# Patient Record
Sex: Male | Born: 1960
Health system: Southern US, Community
[De-identification: ages and names within clinical notes are randomized; demographics above are authoritative.]

## PROBLEM LIST (undated history)

## (undated) DIAGNOSIS — I10 Essential (primary) hypertension: Secondary | ICD-10-CM

## (undated) DIAGNOSIS — K219 Gastro-esophageal reflux disease without esophagitis: Secondary | ICD-10-CM

## (undated) DIAGNOSIS — D649 Anemia, unspecified: Secondary | ICD-10-CM

## (undated) DIAGNOSIS — E785 Hyperlipidemia, unspecified: Secondary | ICD-10-CM

## (undated) HISTORY — DX: Essential (primary) hypertension: I10

## (undated) HISTORY — PX: NO PAST SURGERIES: SHX2092

## (undated) HISTORY — DX: Gastro-esophageal reflux disease without esophagitis: K21.9

## (undated) HISTORY — DX: Anemia, unspecified: D64.9

## (undated) HISTORY — DX: Hyperlipidemia, unspecified: E78.5

---

## 1998-01-24 ENCOUNTER — Emergency Department (HOSPITAL_COMMUNITY): Admission: EM | Admit: 1998-01-24 | Discharge: 1998-01-24 | Payer: Self-pay | Admitting: Emergency Medicine

## 1999-05-14 ENCOUNTER — Encounter: Admission: RE | Admit: 1999-05-14 | Discharge: 1999-05-14 | Payer: Self-pay | Admitting: Family Medicine

## 1999-06-04 ENCOUNTER — Encounter: Admission: RE | Admit: 1999-06-04 | Discharge: 1999-06-04 | Payer: Self-pay | Admitting: Family Medicine

## 1999-07-21 ENCOUNTER — Encounter: Admission: RE | Admit: 1999-07-21 | Discharge: 1999-07-21 | Payer: Self-pay | Admitting: Family Medicine

## 2000-01-20 ENCOUNTER — Emergency Department (HOSPITAL_COMMUNITY): Admission: EM | Admit: 2000-01-20 | Discharge: 2000-01-20 | Payer: Self-pay | Admitting: Emergency Medicine

## 2000-01-20 ENCOUNTER — Encounter: Payer: Self-pay | Admitting: Emergency Medicine

## 2004-04-21 ENCOUNTER — Emergency Department (HOSPITAL_COMMUNITY): Admission: EM | Admit: 2004-04-21 | Discharge: 2004-04-21 | Payer: Self-pay | Admitting: Emergency Medicine

## 2004-04-25 ENCOUNTER — Emergency Department (HOSPITAL_COMMUNITY): Admission: EM | Admit: 2004-04-25 | Discharge: 2004-04-25 | Payer: Self-pay | Admitting: Family Medicine

## 2005-09-02 ENCOUNTER — Emergency Department (HOSPITAL_COMMUNITY): Admission: EM | Admit: 2005-09-02 | Discharge: 2005-09-02 | Payer: Self-pay | Admitting: Emergency Medicine

## 2007-11-08 ENCOUNTER — Emergency Department (HOSPITAL_COMMUNITY): Admission: EM | Admit: 2007-11-08 | Discharge: 2007-11-08 | Payer: Self-pay | Admitting: Emergency Medicine

## 2007-11-09 ENCOUNTER — Emergency Department (HOSPITAL_COMMUNITY): Admission: EM | Admit: 2007-11-09 | Discharge: 2007-11-09 | Payer: Self-pay | Admitting: Emergency Medicine

## 2007-11-11 ENCOUNTER — Emergency Department (HOSPITAL_COMMUNITY): Admission: EM | Admit: 2007-11-11 | Discharge: 2007-11-11 | Payer: Self-pay | Admitting: Emergency Medicine

## 2007-11-14 ENCOUNTER — Emergency Department (HOSPITAL_COMMUNITY): Admission: EM | Admit: 2007-11-14 | Discharge: 2007-11-14 | Payer: Self-pay | Admitting: Emergency Medicine

## 2008-09-23 ENCOUNTER — Emergency Department (HOSPITAL_COMMUNITY): Admission: EM | Admit: 2008-09-23 | Discharge: 2008-09-23 | Payer: Self-pay | Admitting: Emergency Medicine

## 2009-11-21 ENCOUNTER — Emergency Department (HOSPITAL_COMMUNITY): Admission: EM | Admit: 2009-11-21 | Discharge: 2009-11-21 | Payer: Self-pay | Admitting: Emergency Medicine

## 2012-01-17 ENCOUNTER — Emergency Department (HOSPITAL_COMMUNITY)
Admission: EM | Admit: 2012-01-17 | Discharge: 2012-01-17 | Disposition: A | Discharge status: Home / Self Care | Payer: Self-pay | Attending: Emergency Medicine | Admitting: Emergency Medicine

## 2012-01-17 ENCOUNTER — Emergency Department (HOSPITAL_COMMUNITY): Payer: Self-pay

## 2012-01-17 ENCOUNTER — Encounter (HOSPITAL_COMMUNITY): Payer: Self-pay

## 2012-01-17 DIAGNOSIS — R05 Cough: Secondary | ICD-10-CM | POA: Insufficient documentation

## 2012-01-17 DIAGNOSIS — J45901 Unspecified asthma with (acute) exacerbation: Secondary | ICD-10-CM | POA: Insufficient documentation

## 2012-01-17 DIAGNOSIS — R059 Cough, unspecified: Secondary | ICD-10-CM | POA: Insufficient documentation

## 2012-01-17 DIAGNOSIS — F17200 Nicotine dependence, unspecified, uncomplicated: Secondary | ICD-10-CM | POA: Insufficient documentation

## 2012-01-17 MED ORDER — ALBUTEROL SULFATE HFA 108 (90 BASE) MCG/ACT IN AERS
2.0000 | INHALATION_SPRAY | Freq: Four times a day (QID) | RESPIRATORY_TRACT | Status: DC
  Administered 2012-01-17: 2 via RESPIRATORY_TRACT
  Filled 2012-01-17: qty 6.7

## 2012-01-17 MED ORDER — PREDNISONE 20 MG PO TABS
60.0000 mg | ORAL_TABLET | Freq: Once | ORAL | Status: AC
  Administered 2012-01-17: 60 mg via ORAL
  Filled 2012-01-17: qty 3

## 2012-01-17 MED ORDER — IPRATROPIUM BROMIDE 0.02 % IN SOLN
0.5000 mg | Freq: Once | RESPIRATORY_TRACT | Status: AC
  Administered 2012-01-17: 0.5 mg via RESPIRATORY_TRACT
  Filled 2012-01-17: qty 2.5

## 2012-01-17 MED ORDER — ALBUTEROL SULFATE (5 MG/ML) 0.5% IN NEBU
2.5000 mg | INHALATION_SOLUTION | Freq: Once | RESPIRATORY_TRACT | Status: AC
  Administered 2012-01-17: 2.5 mg via RESPIRATORY_TRACT
  Filled 2012-01-17: qty 0.5

## 2012-01-17 NOTE — Discharge Instructions (Signed)

## 2012-01-17 NOTE — ED Notes (Signed)
Complains of gradual onset of asthma over thelast few days, feels uncomfortable. Oxygen level 97 %. Denies pain. Pt with wheezing ntoed.

## 2012-01-17 NOTE — ED Provider Notes (Signed)
History     CSN: [id number]  Arrival date & time 01/17/12  1478   First MD Initiated Contact with Patient 01/17/12 [phone]      Chief Complaint  Patient presents with  . Asthma    (Consider location/radiation/quality/duration/timing/severity/associated sxs/prior treatment) HPI Comments: Asthma: Edward Wilson presents for evaluation of asthma. Patient's symptoms include dyspnea, non-productive cough and wheezing. Associated symptoms include nonproductive cough. The patient has been suffering from these symptoms for approximately 3 weeks. Symptoms have been gradually worsening since their onset. Medications used in the past to treat these symptoms include no prescription medications. Suspected precipitants include pollens. Patient is awoken from sleep approximately none times per night. Patient has not required Emergency Room treatment for these symptoms, and has not required hospitalization. The patient has not been intubated in the past.   Patient is a 51 y.o. male presenting with asthma. The history is provided by the patient.  Asthma Associated symptoms include coughing. Pertinent negatives include no abdominal pain, chest pain, chills, diaphoresis, fever, headaches, rash or sore throat.    Past Medical History  Diagnosis Date  . Asthma     History reviewed. No pertinent past surgical history.  History reviewed. No pertinent family history.  History  Substance Use Topics  . Smoking status: Current Everyday Smoker  . Smokeless tobacco: Not on file  . Alcohol Use: Yes      Review of Systems  Constitutional: Negative for fever, chills and diaphoresis.  HENT: Negative for sore throat, rhinorrhea, drooling, trouble swallowing, neck stiffness, voice change and sinus pressure.   Eyes: Negative for visual disturbance.  Respiratory: Positive for cough, chest tightness and wheezing. Negative for choking, shortness of breath and stridor.   Cardiovascular: Negative for chest pain  and palpitations.  Gastrointestinal: Negative for abdominal pain.  Genitourinary: Negative for dysuria.  Musculoskeletal: Negative for back pain and gait problem.  Skin: Negative for rash.  Neurological: Negative for syncope, speech difficulty, light-headedness and headaches.  Psychiatric/Behavioral: Negative for confusion.  All other systems reviewed and are negative.    Allergies  Review of patient's allergies indicates no known allergies.  Home Medications   Current Outpatient Rx  Name Route Sig Dispense Refill  . IBUPROFEN 200 MG PO TABS Oral Take 800 mg by mouth every 8 (eight) hours as needed. For pain      BP 133/90  Pulse 74  Temp(Src) 98.1 F (36.7 C) (Oral)  Resp 13  SpO2 100%  Physical Exam  Constitutional: He is oriented to person, place, and time. He appears well-developed and well-nourished. No distress.  HENT:  Head: Normocephalic and atraumatic.       Uvula midline, oropharynx clear and moist  Eyes: Conjunctivae and EOM are normal. Pupils are equal, round, and reactive to light.  Neck: Normal range of motion.       No stridor.  Neck supple with full range of motion.  Cardiovascular: Normal rate, regular rhythm, normal heart sounds and intact distal pulses.   Pulmonary/Chest: Effort normal. He has wheezes (expiratory). He exhibits tenderness.       Patient not in respiratory distress, no accessory muscle use, nasal flaring.  Patient able to speak in full sentences.  Airway intact.  Lung auscultation positive for bilateral expiratory wheezing prior to neb, post neb wheezing resolved, but crackles noted in left mid lung  Abdominal: Soft. There is no tenderness.  Musculoskeletal: Normal range of motion. He exhibits no edema and no tenderness.  Neurological: He is alert and oriented  to person, place, and time.  Skin: Skin is warm and dry. No rash noted. He is not diaphoretic.  Psychiatric: He has a normal mood and affect. His behavior is normal.    ED Course    Procedures (including critical care time)  Labs Reviewed - No data to display Dg Chest 2 View  01/17/2012  *RADIOLOGY REPORT*  Clinical Data: Cough and wheezing.  CHEST - 2 VIEW  Comparison: 11/11/2007  Findings: The cardiomediastinal silhouette is unremarkable. The lungs are clear. Probable COPD noted. There is no evidence of focal airspace disease, pulmonary edema, suspicious pulmonary nodule/mass, pleural effusion, or pneumothorax. No acute bony abnormalities are identified.  IMPRESSION: No evidence of acute cardiopulmonary disease.  Probable COPD.  Original Report Authenticated By: Rosendo Gros, M.D.     No diagnosis found.    MDM  Asthma exacerbation  Patient ambulated in ED with O2 saturations maintained >90, no current signs of respiratory distress. Lung exam improved after hospital treatment. Pt states they are breathing at baseline. Pt has been instructed to continue using prescribed medications and to speak with PCP about today's exacerbation.          Jaci Carrel, [place] 01/17/12 1141

## 2012-01-27 ENCOUNTER — Emergency Department (HOSPITAL_COMMUNITY)
Admission: EM | Admit: 2012-01-27 | Discharge: 2012-01-27 | Disposition: A | Discharge status: Home / Self Care | Payer: Self-pay | Attending: Emergency Medicine | Admitting: Emergency Medicine

## 2012-01-27 ENCOUNTER — Encounter (HOSPITAL_COMMUNITY): Payer: Self-pay | Admitting: *Deleted

## 2012-01-27 ENCOUNTER — Emergency Department (HOSPITAL_COMMUNITY): Payer: Self-pay

## 2012-01-27 DIAGNOSIS — J45901 Unspecified asthma with (acute) exacerbation: Secondary | ICD-10-CM | POA: Insufficient documentation

## 2012-01-27 DIAGNOSIS — R0602 Shortness of breath: Secondary | ICD-10-CM | POA: Insufficient documentation

## 2012-01-27 DIAGNOSIS — R05 Cough: Secondary | ICD-10-CM | POA: Insufficient documentation

## 2012-01-27 DIAGNOSIS — H579 Unspecified disorder of eye and adnexa: Secondary | ICD-10-CM | POA: Insufficient documentation

## 2012-01-27 DIAGNOSIS — R059 Cough, unspecified: Secondary | ICD-10-CM | POA: Insufficient documentation

## 2012-01-27 DIAGNOSIS — R6889 Other general symptoms and signs: Secondary | ICD-10-CM | POA: Insufficient documentation

## 2012-01-27 LAB — BASIC METABOLIC PANEL
BUN: 18 mg/dL (ref 6–23)
CO2: 25 mEq/L (ref 19–32)
Calcium: 9.4 mg/dL (ref 8.4–10.5)
Chloride: 104 mEq/L (ref 96–112)
Creatinine, Ser: 0.95 mg/dL (ref 0.50–1.35)
Glucose, Bld: 91 mg/dL (ref 70–99)

## 2012-01-27 LAB — CBC
MCHC: 33.3 g/dL (ref 30.0–36.0)
Platelets: 246 10*3/uL (ref 150–400)
WBC: 7.8 10*3/uL (ref 4.0–10.5)

## 2012-01-27 MED ORDER — PREDNISONE 20 MG PO TABS
60.0000 mg | ORAL_TABLET | Freq: Once | ORAL | Status: AC
  Administered 2012-01-27: 60 mg via ORAL
  Filled 2012-01-27: qty 3

## 2012-01-27 MED ORDER — ALBUTEROL SULFATE HFA 108 (90 BASE) MCG/ACT IN AERS
2.0000 | INHALATION_SPRAY | Freq: Once | RESPIRATORY_TRACT | Status: AC
  Administered 2012-01-27: 2 via RESPIRATORY_TRACT
  Filled 2012-01-27: qty 6.7

## 2012-01-27 MED ORDER — PREDNISONE 20 MG PO TABS: 40.0000 mg | ORAL_TABLET | Freq: Every day | ORAL | Status: DC

## 2012-01-27 MED ORDER — CETIRIZINE HCL 10 MG PO TABS: 10.0000 mg | ORAL_TABLET | Freq: Every day | ORAL | Status: DC

## 2012-01-27 MED ORDER — ALBUTEROL SULFATE (5 MG/ML) 0.5% IN NEBU
5.0000 mg | INHALATION_SOLUTION | Freq: Once | RESPIRATORY_TRACT | Status: AC
  Administered 2012-01-27: 5 mg via RESPIRATORY_TRACT
  Filled 2012-01-27: qty 1

## 2012-01-27 MED ORDER — PREDNISONE 20 MG PO TABS: 40.0000 mg | ORAL_TABLET | Freq: Every day | ORAL | Status: AC

## 2012-01-27 NOTE — ED Provider Notes (Addendum)
History     CSN: [id number]  Arrival date & time 01/27/12  0110   First MD Initiated Contact with Patient 01/27/12 0126      Chief Complaint  Patient presents with  . Shortness of Breath    (Consider location/radiation/quality/duration/timing/severity/associated sxs/prior treatment) HPI Comments: 51 year old male with a history of asthma, uses a daily albuterol MDI. He states that he was seen here recently for similar symptoms and given albuterol treatments, inhaler and a dose of prednisone. He has not been taking prednisone at home. Over the last several days he said increased shortness of breath associated with wheezing and a productive cough of yellow phlegm. He denies fevers chills nausea vomiting sore throat headache nasal congestion. He does admit to having a runny nose and itchy eyes and has been having seasonal allergies for the last couple of years during the season. His symptoms are persistent, he ran out of his MDI this afternoon and this has had worsening shortness of breath and wheezing. He received an albuterol nebulizer treatment prior to his arrival in the room, had significant improvement.  Patient is a 51 y.o. male presenting with shortness of breath. The history is provided by the patient, a relative and medical records.  Shortness of Breath  Associated symptoms include shortness of breath.    Past Medical History  Diagnosis Date  . Asthma     History reviewed. No pertinent past surgical history.  No family history on file.  History  Substance Use Topics  . Smoking status: Current Everyday Smoker  . Smokeless tobacco: Not on file  . Alcohol Use: Yes      Review of Systems  Respiratory: Positive for shortness of breath.   All other systems reviewed and are negative.    Allergies  Review of patient's allergies indicates no known allergies.  Home Medications   Current Outpatient Rx  Name Route Sig Dispense Refill  . DIPHENHYDRAMINE HCL 25 MG PO  TABS Oral Take 25 mg by mouth every 6 (six) hours as needed. allergies    . IBUPROFEN 200 MG PO TABS Oral Take 800 mg by mouth every 8 (eight) hours as needed. For pain    . PREDNISONE 20 MG PO TABS Oral Take 2 tablets (40 mg total) by mouth daily. 10 tablet 0    BP 138/97  Pulse 92  Temp(Src) 98.4 F (36.9 C) (Oral)  Resp 30  SpO2 91%  Physical Exam  Nursing note and vitals reviewed. Constitutional: He appears well-developed and well-nourished. No distress.  HENT:  Head: Normocephalic and atraumatic.  Mouth/Throat: Oropharynx is clear and moist. No oropharyngeal exudate.  Eyes: Conjunctivae and EOM are normal. Pupils are equal, round, and reactive to light. Right eye exhibits no discharge. Left eye exhibits no discharge. No scleral icterus.  Neck: Normal range of motion. Neck supple. No JVD present. No thyromegaly present.  Cardiovascular: Normal rate, regular rhythm, normal heart sounds and intact distal pulses.  Exam reveals no gallop and no friction rub.   No murmur heard. Pulmonary/Chest: No respiratory distress. He has wheezes. He has no rales.       Slight increased work of breathing with respirations of 22 per minute, no accessory muscle use, diffuse expiratory wheezing with mild prolonged expiratory phase. Has mild inspiratory rales scattered, these clear with coughing. Oxygen levels of 92-95% on room air  Abdominal: Soft. Bowel sounds are normal. He exhibits no distension and no mass. There is no tenderness.  Musculoskeletal: Normal range of motion. He exhibits  no edema and no tenderness.  Lymphadenopathy:    He has no cervical adenopathy.  Neurological: He is alert. Coordination normal.  Skin: Skin is warm and dry. No rash noted. No erythema.  Psychiatric: He has a normal mood and affect. His behavior is normal.    ED Course  Procedures (including critical care time)  ED ECG REPORT   Date: 01/27/2012   Rate: 90  Rhythm: normal sinus rhythm  QRS Axis: normal   Intervals: normal  ST/T Wave abnormalities: normal  Conduction Disutrbances:none  Narrative Interpretation:  Anterior Q waves, seen on prior EKG from 2009  Old EKG Reviewed: Compared with 11/14/2007, no significant changes other than slight increase in rate    Labs Reviewed  CBC  BASIC METABOLIC PANEL   Dg Chest 2 View  01/27/2012  *RADIOLOGY REPORT*  Clinical Data: Shortness of breath.  Asthma attack.  CHEST - 2 VIEW  Comparison: 01/17/2012  Findings: Normal heart size and pulmonary vascularity.  No focal airspace consolidation in the lungs.  No blunting of costophrenic angles.  No pneumothorax.  Degenerative changes in the spine.  No significant change since previous study.  IMPRESSION: No evidence of active pulmonary disease.  Original Report Authenticated By: Marlon Pel, M.D.     1. Asthma attack       MDM  Overall the patient appears in minimal distress, has a slight hypoxia with mild tachypnea. Will rule out pneumonia with chest x-ray, the patient is afebrile and does not have a tachycardia. Currently her oxygen levels are above 94% on room air, he has received albuterol inhaler treatment with good improvement, prednisone to follow, MDI here and to dispense with patient. We'll start on antihistamines for ongoing seasonal allergies to try to prevent recurrent asthma attacks.    Chest x-ray negative, vital signs have improved significantly, patient has received albuterol MDI and prednisone and will be discharged home.  Discharge Prescriptions include:  #1 albuterol MDI  #2 Zyrtec  #3 prednisone  Vida Roller, MD 01/27/12 1610  Vida Roller, MD 01/27/12 [phone]

## 2012-01-27 NOTE — ED Notes (Signed)
SOB since earlier today.  Pt had the same thing Sunday.  Pt has had a productive cough (yellow phlegm).  Able to speak in short sentences in triage

## 2012-01-27 NOTE — Discharge Instructions (Signed)
Your chest x-ray is normal, please take the albuterol inhaler every 4 hours, prednisone once a day, followup with your Dr. If you do not have a doctor see the list below.  Return to the emergency department for severe or worsening difficulty breathing, shortness of breath, chest pain, fevers.  You must take the prednisone once daily!!  RESOURCE GUIDE  Dental Problems  Patients with Medicaid: [facility] [id number]                                           [phone] [address] Phone:  [phone]                                                  Phone:  [phone]  If unable to pay or uninsured, contact:  [facility] or [facility]. to become qualified for the adult dental clinic.  Chronic Pain Problems Contact [facility]  [phone] Patients need to be referred by their primary care doctor.  Insufficient Money for Medicine Contact [employer]:  call "211" or [facility] 225-464-5015.  No Primary Care Doctor Call [facility]  [phone] Other agencies that provide inexpensive medical care    [facility]  [phone]    [facility]  [phone]    [facility]  [phone]    [facility]  [phone]    [facility]  [phone]    [facility]  [phone]  Psychological Services [facility]al [facility]  [phone] [facility]  [phone] [facility]   [phone] (emergency services [phone])  Substance Abuse Resources [facility]  [phone] [facility] [phone] [facility] [place] [phone] Floydene Flock [phone] Residential & Outpatient Substance Abuse Program  [phone]  Abuse/Neglect [facility] Child Abuse Hotline 972-508-4310 [facility] Child Abuse Hotline 631-275-1978 (After Hours)  Emergency Shelter [facility] [phone]  Maternity Homes [facility] [place] [facility] [phone] [facility] 8300496390  MRSA Hotline #:   (817)726-7257    [facility] Resources  [facility]     [employer]                          [facility] Dept. [address] [place]                       [address]      [address]  [place]                                                Cristobal Goldmann Phone:  [phone]  Phone:  [phone]                 Phone:  [phone]  [place] [facility] Phone:  [phone]  [place] Child Abuse Hotline (336) 342-1394 [phone] [phone] (After Hours)   

## 2012-01-27 NOTE — ED Notes (Signed)
Patient given discharge paperwork; went over discharge instructions with patient.  Instructed patient to take Prednisone, Albuterol, and Zyrtec as directed.  Patient instructed to follow up with his primary care physician/referral (call today) and to return to the ED for new, worsening, or concerning symptoms.

## 2012-01-30 NOTE — ED Provider Notes (Signed)
Evaluation and management procedures were performed by the PA/NP/resident physician under my supervision/collaboration.   Lajean Boese D Jaken Fregia, MD 01/30/12 1925 

## 2013-01-25 ENCOUNTER — Emergency Department (HOSPITAL_COMMUNITY)
Admission: EM | Admit: 2013-01-25 | Discharge: 2013-01-25 | Disposition: A | Discharge status: Home / Self Care | Payer: Self-pay | Attending: Emergency Medicine | Admitting: Emergency Medicine

## 2013-01-25 ENCOUNTER — Encounter (HOSPITAL_COMMUNITY): Payer: Self-pay | Admitting: Emergency Medicine

## 2013-01-25 ENCOUNTER — Emergency Department (HOSPITAL_COMMUNITY): Payer: Self-pay

## 2013-01-25 DIAGNOSIS — Z79899 Other long term (current) drug therapy: Secondary | ICD-10-CM | POA: Insufficient documentation

## 2013-01-25 DIAGNOSIS — R059 Cough, unspecified: Secondary | ICD-10-CM | POA: Insufficient documentation

## 2013-01-25 DIAGNOSIS — R0602 Shortness of breath: Secondary | ICD-10-CM | POA: Insufficient documentation

## 2013-01-25 DIAGNOSIS — F17200 Nicotine dependence, unspecified, uncomplicated: Secondary | ICD-10-CM | POA: Insufficient documentation

## 2013-01-25 DIAGNOSIS — J45901 Unspecified asthma with (acute) exacerbation: Secondary | ICD-10-CM | POA: Insufficient documentation

## 2013-01-25 DIAGNOSIS — R05 Cough: Secondary | ICD-10-CM | POA: Insufficient documentation

## 2013-01-25 MED ORDER — ALBUTEROL SULFATE HFA 108 (90 BASE) MCG/ACT IN AERS
2.0000 | INHALATION_SPRAY | RESPIRATORY_TRACT | Status: DC | PRN
  Administered 2013-01-25: 2 via RESPIRATORY_TRACT
  Filled 2013-01-25: qty 6.7

## 2013-01-25 MED ORDER — ALBUTEROL SULFATE (5 MG/ML) 0.5% IN NEBU
5.0000 mg | INHALATION_SOLUTION | Freq: Once | RESPIRATORY_TRACT | Status: AC
  Administered 2013-01-25: 5 mg via RESPIRATORY_TRACT
  Filled 2013-01-25: qty 1

## 2013-01-25 MED ORDER — IPRATROPIUM BROMIDE 0.02 % IN SOLN
RESPIRATORY_TRACT | Status: AC
  Filled 2013-01-25: qty 2.5

## 2013-01-25 MED ORDER — PREDNISONE 20 MG PO TABS: 20.0000 mg | ORAL_TABLET | Freq: Two times a day (BID) | ORAL | Status: DC

## 2013-01-25 MED ORDER — IPRATROPIUM BROMIDE 0.02 % IN SOLN
0.5000 mg | Freq: Once | RESPIRATORY_TRACT | Status: AC
  Administered 2013-01-25: 0.5 mg via RESPIRATORY_TRACT

## 2013-01-25 MED ORDER — PREDNISONE 20 MG PO TABS
40.0000 mg | ORAL_TABLET | Freq: Once | ORAL | Status: AC
  Administered 2013-01-25: 40 mg via ORAL
  Filled 2013-01-25: qty 2

## 2013-01-25 MED ORDER — ALBUTEROL SULFATE (5 MG/ML) 0.5% IN NEBU
5.0000 mg | INHALATION_SOLUTION | Freq: Once | RESPIRATORY_TRACT | Status: AC
  Administered 2013-01-25: 5 mg via RESPIRATORY_TRACT

## 2013-01-25 MED ORDER — ALBUTEROL SULFATE (5 MG/ML) 0.5% IN NEBU
INHALATION_SOLUTION | RESPIRATORY_TRACT | Status: AC
  Filled 2013-01-25: qty 1

## 2013-01-25 NOTE — ED Provider Notes (Signed)
Medical screening examination/treatment/procedure(s) were performed by non-physician practitioner and as supervising physician I was immediately available for consultation/collaboration. Devoria Albe, MD, Armando Gang   Ward Givens, MD 01/25/13 [phone]

## 2013-01-25 NOTE — ED Provider Notes (Signed)
MSE was initiated and I personally evaluated the patient and placed orders (if any) at  7:33 PM on January 25, 2013.  Patient is a 52 year old male with a history of asthma who presents for worsening shortness of breath for the last 5 days. Patient states his shortness of breath is intermittent and waxing and waning in severity. Patient has been using his albuterol inhaler as needed which provides him mild relief. He admits to an associated dry cough and denies fever, syncope, vision changes, lightheadedness or dizziness, chest pain, nausea or vomiting, abdominal pain, numbness or tingling in his extremities. On physical exam patient is well and nontoxic appearing, heart regular rate and rhythm, lungs with decreased breath sounds in all lung fields with mild expiratory wheezes appreciated on auscultation of the mid right lung field. Hemodynamically stable. Second albuterol neb tx ordered. Patient without complaints at this time. CXR completed; CBC, BMP, troponin, and EKG ordered.  The patient appears stable so that the remainder of the MSE may be completed by another provider.  Antony Madura, PA-C 01/25/13 [phone]

## 2013-01-25 NOTE — ED Notes (Signed)
Pt here with increased problems with asthma; pt sts home meds not helping

## 2013-01-25 NOTE — ED Notes (Signed)
Pt refusing lab work.  ED PA [facility] notified.

## 2013-02-17 NOTE — ED Provider Notes (Signed)
History     CSN: [id number]  Arrival date & time 01/25/13  1640   First MD Initiated Contact with Patient 01/25/13 1853      Chief Complaint  Patient presents with  . Asthma    (Consider location/radiation/quality/duration/timing/severity/associated sxs/prior treatment) HPI History provided by pt.   Pt presents w/ typical asthma exacerbation.  Has had 5 days of gradually worsening, waxing and waning SOB.  Associated w/ cough and wheezing.  Denies fever and chest pain.  Has been using his albuterol inhaler w/ mild relief.   Past Medical History  Diagnosis Date  . Asthma     History reviewed. No pertinent past surgical history.  History reviewed. No pertinent family history.  History  Substance Use Topics  . Smoking status: Current Every Day Smoker  . Smokeless tobacco: Not on file  . Alcohol Use: Yes      Review of Systems  All other systems reviewed and are negative.    Allergies  Review of patient's allergies indicates no known allergies.  Home Medications   Current Outpatient Rx  Name  Route  Sig  Dispense  Refill  . albuterol (PROVENTIL HFA;VENTOLIN HFA) 108 (90 BASE) MCG/ACT inhaler   Inhalation   Inhale 2 puffs into the lungs every 6 (six) hours as needed for wheezing.         . predniSONE (DELTASONE) 20 MG tablet   Oral   Take 1 tablet (20 mg total) by mouth 2 (two) times daily.   10 tablet   0     BP 152/96  Pulse 70  Temp(Src) 98.4 F (36.9 C) (Oral)  Resp 12  SpO2 97%  Physical Exam  Nursing note and vitals reviewed. Constitutional: He is oriented to person, place, and time. He appears well-developed and well-nourished. No distress.  HENT:  Head: Normocephalic and atraumatic.  Eyes:  Normal appearance  Neck: Normal range of motion.  Cardiovascular: Normal rate and regular rhythm.   Pulmonary/Chest: Effort normal and breath sounds normal. No respiratory distress. He has no wheezes.  Musculoskeletal: Normal range of motion.   Neurological: He is alert and oriented to person, place, and time.  Skin: Skin is warm and dry. No rash noted.  Psychiatric: He has a normal mood and affect. His behavior is normal.    ED Course  Procedures (including critical care time)  Labs Reviewed - No data to display No results found.   1. Asthma exacerbation       MDM  52yo asthmatic presented to ED w/ typical exacerbation x 5d.  Patient screened by daytime mid-level, had diffuse, diminished breath sounds and mild wheezing on initial exam.  Has received two breathing treatments and reports feeling better.  Breath sounds have normalized.  Patient ambulated w/out dyspnea or drop in O2 sats.  Prescribed 5 day course of prednisone.  Return precautions discussed. 8:21 PM         Otilio Miu, PA-C 02/17/13 2021

## 2013-02-21 NOTE — ED Provider Notes (Signed)
Medical screening examination/treatment/procedure(s) were performed by non-physician practitioner and as supervising physician I was immediately available for consultation/collaboration. Devoria Albe, MD, Armando Gang   Ward Givens, MD 02/21/13 [phone]

## 2014-01-08 ENCOUNTER — Encounter (HOSPITAL_COMMUNITY): Payer: Self-pay | Admitting: Emergency Medicine

## 2014-01-08 ENCOUNTER — Emergency Department (HOSPITAL_COMMUNITY)
Admission: EM | Admit: 2014-01-08 | Discharge: 2014-01-08 | Disposition: A | Discharge status: Home / Self Care | Payer: BC Managed Care – PPO | Source: Home / Self Care | Attending: Family Medicine | Admitting: Family Medicine

## 2014-01-08 DIAGNOSIS — J302 Other seasonal allergic rhinitis: Secondary | ICD-10-CM

## 2014-01-08 DIAGNOSIS — J309 Allergic rhinitis, unspecified: Secondary | ICD-10-CM

## 2014-01-08 DIAGNOSIS — J9801 Acute bronchospasm: Secondary | ICD-10-CM

## 2014-01-08 MED ORDER — IPRATROPIUM BROMIDE 0.02 % IN SOLN
0.5000 mg | Freq: Once | RESPIRATORY_TRACT | Status: AC
  Administered 2014-01-08: 0.5 mg via RESPIRATORY_TRACT

## 2014-01-08 MED ORDER — ALBUTEROL SULFATE HFA 108 (90 BASE) MCG/ACT IN AERS: 1.0000 | INHALATION_SPRAY | Freq: Four times a day (QID) | RESPIRATORY_TRACT | Status: DC | PRN

## 2014-01-08 MED ORDER — ALBUTEROL SULFATE (2.5 MG/3ML) 0.083% IN NEBU
INHALATION_SOLUTION | RESPIRATORY_TRACT | Status: AC
  Filled 2014-01-08: qty 6

## 2014-01-08 MED ORDER — CETIRIZINE HCL 10 MG PO TABS: 10.0000 mg | ORAL_TABLET | Freq: Every day | ORAL | Status: DC

## 2014-01-08 MED ORDER — IPRATROPIUM BROMIDE 0.02 % IN SOLN
RESPIRATORY_TRACT | Status: AC
  Filled 2014-01-08: qty 2.5

## 2014-01-08 MED ORDER — ALBUTEROL SULFATE (2.5 MG/3ML) 0.083% IN NEBU
5.0000 mg | INHALATION_SOLUTION | Freq: Once | RESPIRATORY_TRACT | Status: AC
  Administered 2014-01-08: 5 mg via RESPIRATORY_TRACT

## 2014-01-08 NOTE — ED Provider Notes (Signed)
CSN: [id number]     Arrival date & time 01/08/14  1831 History   First MD Initiated Contact with Patient 01/08/14 1851     Chief Complaint  Patient presents with  . Asthma   (Consider location/radiation/quality/duration/timing/severity/associated sxs/prior Treatment) HPI Comments: Has run out of albuterol MDI. No fever, hemoptysis or chest pain.   Patient is a 54 y.o. male presenting with asthma. The history is provided by the patient.  Asthma This is a recurrent (States he often has exaserbation this time of year during spring allergy season. ) problem. Episode onset: 1 week ago. The problem occurs constantly. The problem has been gradually worsening. Associated symptoms include shortness of breath. Pertinent negatives include no chest pain.    Past Medical History  Diagnosis Date  . Asthma    History reviewed. No pertinent past surgical history. Family History  Problem Relation Age of Onset  . Heart failure Mother     heart attack   History  Substance Use Topics  . Smoking status: Current Some Day Smoker -- 0.10 packs/day    Types: Cigarettes  . Smokeless tobacco: Not on file  . Alcohol Use: 2.4 oz/week    4 Cans of beer per week    Review of Systems  Constitutional: Negative for fever and chills.  HENT: Positive for congestion.   Respiratory: Positive for cough, chest tightness, shortness of breath and wheezing.   Cardiovascular: Negative for chest pain, palpitations and leg swelling.  Gastrointestinal: Negative.   Musculoskeletal: Negative.   Skin: Negative.   Neurological: Negative.     Allergies  Review of patient's allergies indicates no known allergies.  Home Medications   Current Outpatient Rx  Name  Route  Sig  Dispense  Refill  . albuterol (PROVENTIL HFA;VENTOLIN HFA) 108 (90 BASE) MCG/ACT inhaler   Inhalation   Inhale 2 puffs into the lungs every 6 (six) hours as needed for wheezing.         Marland Kitchen albuterol (PROVENTIL HFA;VENTOLIN HFA) 108 (90 BASE)  MCG/ACT inhaler   Inhalation   Inhale 1-2 puffs into the lungs every 6 (six) hours as needed for wheezing or shortness of breath.   1 Inhaler   0   . cetirizine (ZYRTEC) 10 MG tablet   Oral   Take 1 tablet (10 mg total) by mouth daily.   30 tablet   0   . predniSONE (DELTASONE) 20 MG tablet   Oral   Take 1 tablet (20 mg total) by mouth 2 (two) times daily.   10 tablet   0    BP 164/97  Pulse 88  Temp(Src) 98.4 F (36.9 C) (Oral)  Resp 28  SpO2 96% Physical Exam  Nursing note and vitals reviewed. Constitutional: He is oriented to person, place, and time. He appears well-developed and well-nourished. No distress.  HENT:  Head: Normocephalic and atraumatic.  Right Ear: Hearing and external ear normal.  Left Ear: Hearing and external ear normal.  Nose: Nose normal.  Eyes: Conjunctivae are normal. Right eye exhibits no discharge. Left eye exhibits no discharge. No scleral icterus.  Neck: Normal range of motion. Neck supple. No JVD present.  Cardiovascular: Normal rate, regular rhythm and normal heart sounds.   Pulmonary/Chest: Effort normal. No stridor. No respiratory distress. He has wheezes. He has no rales. He exhibits no tenderness.  Abdominal: Soft. Bowel sounds are normal. He exhibits no distension. There is no tenderness.  Musculoskeletal: Normal range of motion.  Lymphadenopathy:    He has no cervical  adenopathy.  Neurological: He is alert and oriented to person, place, and time.  Skin: Skin is warm and dry. No rash noted.  Psychiatric: He has a normal mood and affect. His behavior is normal.    ED Course  Procedures (including critical care time) Labs Review Labs Reviewed - No data to display Imaging Review No results found.   MDM   1. Seasonal allergic rhinitis   2. Bronchospasm    Reports significant improvement following Albuterol/Atrovent neb tx at [facility]. Will advise he return to taking Zyrtec and will provide new Rx for his albuterol MDI and resource  info to locate PCP.   [place], [place] 01/08/14 1950

## 2014-01-08 NOTE — ED Notes (Signed)
C/o wheezing onset last week when the pollen came out. Ran out of inhaler.  Hx. of asthma as a child but past few years has come back. Audible exp. wheezing.  No coughing, sneezing or runny nose.  C/o itchy watery eyes.  Stabbing pain in his foot, that lasted for 1 month, has been gone for 2 weeks.  Wants to know what it was.

## 2014-01-08 NOTE — ED Provider Notes (Signed)
Medical screening examination/treatment/procedure(s) were performed by resident physician or non-physician practitioner and as supervising physician I was immediately available for consultation/collaboration.   Pauline Good MD.   Billy Fischer, MD 01/08/14 2127

## 2014-01-08 NOTE — Discharge Instructions (Signed)
Allergic Rhinitis Allergic rhinitis is when the mucous membranes in the nose respond to allergens. Allergens are particles in the air that cause your body to have an allergic reaction. This causes you to release allergic antibodies. Through a chain of events, these eventually cause you to release histamine into the blood stream. Although meant to protect the body, it is this release of histamine that causes your discomfort, such as frequent sneezing, congestion, and an itchy, runny nose.  CAUSES  Seasonal allergic rhinitis (hay fever) is caused by pollen allergens that may come from grasses, trees, and weeds. Year-round allergic rhinitis (perennial allergic rhinitis) is caused by allergens such as house dust mites, pet dander, and mold spores.  SYMPTOMS   Nasal stuffiness (congestion).  Itchy, runny nose with sneezing and tearing of the eyes. DIAGNOSIS  Your health care provider can help you determine the allergen or allergens that trigger your symptoms. If you and your health care provider are unable to determine the allergen, skin or blood testing may be used. TREATMENT  Allergic Rhinitis does not have a cure, but it can be controlled by:  Medicines and allergy shots (immunotherapy).  Avoiding the allergen. Hay fever may often be treated with antihistamines in pill or nasal spray forms. Antihistamines block the effects of histamine. There are over-the-counter medicines that may help with nasal congestion and swelling around the eyes. Check with your health care provider before taking or giving this medicine.  If avoiding the allergen or the medicine prescribed do not work, there are many new medicines your health care provider can prescribe. Stronger medicine may be used if initial measures are ineffective. Desensitizing injections can be used if medicine and avoidance does not work. Desensitization is when a patient is given ongoing shots until the body becomes less sensitive to the allergen.  Make sure you follow up with your health care provider if problems continue. HOME CARE INSTRUCTIONS It is not possible to completely avoid allergens, but you can reduce your symptoms by taking steps to limit your exposure to them. It helps to know exactly what you are allergic to so that you can avoid your specific triggers. SEEK MEDICAL CARE IF:   You have a fever.  You develop a cough that does not stop easily (persistent).  You have shortness of breath.  You start wheezing.  Symptoms interfere with normal daily activities. Document Released: 06/16/2001 Document Revised: 07/12/2013 Document Reviewed: 05/29/2013 Exit[employer] Patient Information 2014 [employer].  

## 2014-01-17 ENCOUNTER — Encounter (HOSPITAL_COMMUNITY): Payer: Self-pay | Admitting: Emergency Medicine

## 2014-01-17 ENCOUNTER — Emergency Department (HOSPITAL_COMMUNITY)
Admission: EM | Admit: 2014-01-17 | Discharge: 2014-01-17 | Disposition: A | Discharge status: Home / Self Care | Payer: BC Managed Care – PPO | Attending: Emergency Medicine | Admitting: Emergency Medicine

## 2014-01-17 ENCOUNTER — Emergency Department (HOSPITAL_COMMUNITY): Payer: BC Managed Care – PPO

## 2014-01-17 DIAGNOSIS — B352 Tinea manuum: Secondary | ICD-10-CM | POA: Insufficient documentation

## 2014-01-17 DIAGNOSIS — F17200 Nicotine dependence, unspecified, uncomplicated: Secondary | ICD-10-CM | POA: Insufficient documentation

## 2014-01-17 DIAGNOSIS — B353 Tinea pedis: Secondary | ICD-10-CM | POA: Insufficient documentation

## 2014-01-17 DIAGNOSIS — IMO0002 Reserved for concepts with insufficient information to code with codable children: Secondary | ICD-10-CM | POA: Insufficient documentation

## 2014-01-17 DIAGNOSIS — J45901 Unspecified asthma with (acute) exacerbation: Secondary | ICD-10-CM

## 2014-01-17 DIAGNOSIS — B359 Dermatophytosis, unspecified: Secondary | ICD-10-CM

## 2014-01-17 DIAGNOSIS — Z79899 Other long term (current) drug therapy: Secondary | ICD-10-CM | POA: Insufficient documentation

## 2014-01-17 LAB — I-STAT TROPONIN, ED: TROPONIN I, POC: 0 ng/mL (ref 0.00–0.08)

## 2014-01-17 LAB — CBC WITH DIFFERENTIAL/PLATELET
BASOS ABS: 0 10*3/uL (ref 0.0–0.1)
BASOS PCT: 0 % (ref 0–1)
EOS ABS: 0.4 10*3/uL (ref 0.0–0.7)
Eosinophils Relative: 6 % — ABNORMAL HIGH (ref 0–5)
HCT: 37.5 % — ABNORMAL LOW (ref 39.0–52.0)
Hemoglobin: 12.4 g/dL — ABNORMAL LOW (ref 13.0–17.0)
Lymphocytes Relative: 33 % (ref 12–46)
Lymphs Abs: 2.4 10*3/uL (ref 0.7–4.0)
MCH: 29.7 pg (ref 26.0–34.0)
MCHC: 33.1 g/dL (ref 30.0–36.0)
MCV: 89.7 fL (ref 78.0–100.0)
Monocytes Absolute: 0.5 10*3/uL (ref 0.1–1.0)
Monocytes Relative: 6 % (ref 3–12)
NEUTROS ABS: 4.1 10*3/uL (ref 1.7–7.7)
NEUTROS PCT: 55 % (ref 43–77)
PLATELETS: 222 10*3/uL (ref 150–400)
RBC: 4.18 MIL/uL — ABNORMAL LOW (ref 4.22–5.81)
RDW: 13.5 % (ref 11.5–15.5)
WBC: 7.4 10*3/uL (ref 4.0–10.5)

## 2014-01-17 MED ORDER — IPRATROPIUM-ALBUTEROL 0.5-2.5 (3) MG/3ML IN SOLN: 3.0000 mL | RESPIRATORY_TRACT | Status: DC | PRN

## 2014-01-17 MED ORDER — IPRATROPIUM-ALBUTEROL 20-100 MCG/ACT IN AERS
1.0000 | INHALATION_SPRAY | Freq: Four times a day (QID) | RESPIRATORY_TRACT | Status: DC
  Administered 2014-01-17: 1 via RESPIRATORY_TRACT
  Filled 2014-01-17: qty 4

## 2014-01-17 MED ORDER — IPRATROPIUM BROMIDE 0.02 % IN SOLN
0.5000 mg | Freq: Once | RESPIRATORY_TRACT | Status: AC
  Administered 2014-01-17: 0.5 mg via RESPIRATORY_TRACT
  Filled 2014-01-17: qty 2.5

## 2014-01-17 MED ORDER — IPRATROPIUM-ALBUTEROL 20-100 MCG/ACT IN AERS: 1.0000 | INHALATION_SPRAY | Freq: Four times a day (QID) | RESPIRATORY_TRACT | Status: DC | PRN

## 2014-01-17 MED ORDER — FLUCONAZOLE 100 MG PO TABS
200.0000 mg | ORAL_TABLET | Freq: Once | ORAL | Status: AC
  Administered 2014-01-17: 200 mg via ORAL
  Filled 2014-01-17: qty 2

## 2014-01-17 MED ORDER — ALBUTEROL SULFATE (2.5 MG/3ML) 0.083% IN NEBU
5.0000 mg | INHALATION_SOLUTION | Freq: Once | RESPIRATORY_TRACT | Status: AC
  Administered 2014-01-17: 5 mg via RESPIRATORY_TRACT
  Filled 2014-01-17: qty 6

## 2014-01-17 MED ORDER — FLUCONAZOLE 200 MG PO TABS: 200.0000 mg | ORAL_TABLET | Freq: Every day | ORAL | Status: AC

## 2014-01-17 MED ORDER — PREDNISONE 20 MG PO TABS: 40.0000 mg | ORAL_TABLET | Freq: Every day | ORAL | Status: DC

## 2014-01-17 MED ORDER — PREDNISONE 20 MG PO TABS
60.0000 mg | ORAL_TABLET | Freq: Once | ORAL | Status: AC
  Administered 2014-01-17: 60 mg via ORAL
  Filled 2014-01-17: qty 3

## 2014-01-17 MED ORDER — KETOCONAZOLE 2 % EX CREA
TOPICAL_CREAM | Freq: Two times a day (BID) | CUTANEOUS | Status: DC
  Administered 2014-01-17: 1 via TOPICAL
  Filled 2014-01-17: qty 15

## 2014-01-17 NOTE — Discharge Instructions (Signed)
Asthma, Adult Asthma is a recurring condition in which the airways tighten and narrow. Asthma can make it difficult to breathe. It can cause coughing, wheezing, and shortness of breath. Asthma episodes (also called asthma attacks) range from minor to life-threatening. Asthma cannot be cured, but medicines and lifestyle changes can help control it. CAUSES Asthma is believed to be caused by inherited (genetic) and environmental factors, but its exact cause is unknown. Asthma may be triggered by allergens, lung infections, or irritants in the air. Asthma triggers are different for each person. Common triggers include:   Animal dander.  Dust mites.  Cockroaches.  Pollen from trees or grass.  Mold.  Smoke.  Air pollutants such as dust, household cleaners, hair sprays, aerosol sprays, paint fumes, strong chemicals, or strong odors.  Cold air, weather changes, and winds (which increase molds and pollens in the air).  Strong emotional expressions such as crying or laughing hard.  Stress.  Certain medicines (such as aspirin) or types of drugs (such as beta-blockers).  Sulfites in foods and drinks. Foods and drinks that may contain sulfites include dried fruit, potato chips, and sparkling grape juice.  Infections or inflammatory conditions such as the flu, a cold, or an inflammation of the nasal membranes (rhinitis).  Gastroesophageal reflux disease (GERD).  Exercise or strenuous activity. SYMPTOMS Symptoms may occur immediately after asthma is triggered or many hours later. Symptoms include:  Wheezing.  Excessive nighttime or early morning coughing.  Frequent or severe coughing with a common cold.  Chest tightness.  Shortness of breath. DIAGNOSIS  The diagnosis of asthma is made by a review of your medical history and a physical exam. Tests may also be performed. These may include:  Lung function studies. These tests show how much air you breath in and out.  Allergy  tests.  Imaging tests such as X-rays. TREATMENT  Asthma cannot be cured, but it can usually be controlled. Treatment involves identifying and avoiding your asthma triggers. It also involves medicines. There are 2 classes of medicine used for asthma treatment:   Controller medicines. These prevent asthma symptoms from occurring. They are usually taken every day.  Reliever or rescue medicines. These quickly relieve asthma symptoms. They are used as needed and provide short-term relief. Your health care provider will help you create an asthma action plan. An asthma action plan is a written plan for managing and treating your asthma attacks. It includes a list of your asthma triggers and how they may be avoided. It also includes information on when medicines should be taken and when their dosage should be changed. An action plan may also involve the use of a device called a peak flow meter. A peak flow meter measures how well the lungs are working. It helps you monitor your condition. HOME CARE INSTRUCTIONS   Take medicine as directed by your health care provider. Speak with your health care provider if you have questions about how or when to take the medicines.  Use a peak flow meter as directed by your health care provider. Record and keep track of readings.  Understand and use the action plan to help minimize or stop an asthma attack without needing to seek medical care.  Control your home environment in the following ways to help prevent asthma attacks:  Do not smoke. Avoid being exposed to secondhand smoke.  Change your heating and air conditioning filter regularly.  Limit your use of fireplaces and wood stoves.  Get rid of pests (such as roaches and   mice) and their droppings.  Throw away plants if you see mold on them.  Clean your floors and dust regularly. Use unscented cleaning products.  Try to have someone else vacuum for you regularly. Stay out of rooms while they are being  vacuumed and for a short while afterward. If you vacuum, use a dust mask from a hardware store, a double-layered or microfilter vacuum cleaner bag, or a vacuum cleaner with a HEPA filter.  Replace carpet with wood, tile, or vinyl flooring. Carpet can trap dander and dust.  Use allergy-proof pillows, mattress covers, and box spring covers.  Wash bed sheets and blankets every week in hot water and dry them in a dryer.  Use blankets that are made of polyester or cotton.  Clean bathrooms and kitchens with bleach. If possible, have someone repaint the walls in these rooms with mold-resistant paint. Keep out of the rooms that are being cleaned and painted.  Wash hands frequently. SEEK MEDICAL CARE IF:   You have wheezing, shortness of breath, or a cough even if taking medicine to prevent attacks.  The colored mucus you cough up (sputum) is thicker than usual.  Your sputum changes from clear or white to yellow, green, gray, or bloody.  You have any problems that may be related to the medicines you are taking (such as a rash, itching, swelling, or trouble breathing).  You are using a reliever medicine more than 2 3 times per week.  Your peak flow is still at 50 79% of you personal best after following your action plan for 1 hour. SEEK IMMEDIATE MEDICAL CARE IF:   You seem to be getting worse and are unresponsive to treatment during an asthma attack.  You are short of breath even at rest.  You get short of breath when doing very little physical activity.  You have difficulty eating, drinking, or talking due to asthma symptoms.  You develop chest pain.  You develop a fast heartbeat.  You have a bluish color to your lips or fingernails.  You are lightheaded, dizzy, or faint.  Your peak flow is less than 50% of your personal best.  You have a fever or persistent symptoms for more than 2 3 days.  You have a fever and symptoms suddenly get worse. MAKE SURE YOU:   Understand these  instructions.  Will watch your condition.  Will get help right away if you are not doing well or get worse. Document Released: 09/21/2005 Document Revised: 05/24/2013 Document Reviewed: 04/20/2013 [employer] Patient Information 2014 [employer].  

## 2014-01-17 NOTE — ED Provider Notes (Signed)
CSN: [id number]     Arrival date & time 01/17/14  2024 History   First MD Initiated Contact with Patient 01/17/14 2035     Chief Complaint  Patient presents with  . Shortness of Breath     (Consider location/radiation/quality/duration/timing/severity/associated sxs/prior Treatment) Patient is a 53 y.o. male presenting with shortness of breath. The history is provided by the patient.  Shortness of Breath Severity:  Severe Onset quality:  Sudden Duration:  2 hours Timing:  Constant Progression:  Worsening Chronicity:  Recurrent Context: known allergens   Context comment:  Was urgent care last week and got a breathing treatment and an inhaler.  he was taken prednisone he had a year ago and was doing better until tonight. Relieved by:  Nothing Worsened by:  Activity Ineffective treatments:  Inhaler Associated symptoms: cough, rash and wheezing   Associated symptoms: no abdominal pain, no chest pain, no fever, no headaches and no sputum production   Rash:    Location:  Hand and foot   Quality: blistering, dryness, itchiness, peeling and scaling     Severity:  Severe   Onset quality:  Gradual   Duration:  1 week   Timing:  Constant   Progression:  Worsening Risk factors: tobacco use   Risk factors: no prolonged immobilization and no recent surgery     Past Medical History  Diagnosis Date  . Asthma    History reviewed. No pertinent past surgical history. Family History  Problem Relation Age of Onset  . Heart failure Mother     heart attack   History  Substance Use Topics  . Smoking status: Current Some Day Smoker -- 0.10 packs/day    Types: Cigarettes  . Smokeless tobacco: Not on file  . Alcohol Use: 2.4 oz/week    4 Cans of beer per week    Review of Systems  Constitutional: Negative for fever.  Respiratory: Positive for cough, shortness of breath and wheezing. Negative for sputum production.   Cardiovascular: Negative for chest pain.  Gastrointestinal: Negative  for abdominal pain.  Skin: Positive for rash.  Neurological: Negative for headaches.  All other systems reviewed and are negative.     Allergies  Shellfish allergy  Home Medications   Prior to Admission medications   Medication Sig Start Date End Date Taking? Authorizing Provider  albuterol (PROVENTIL HFA;VENTOLIN HFA) 108 (90 BASE) MCG/ACT inhaler Inhale 1-2 puffs into the lungs every 6 (six) hours as needed for wheezing or shortness of breath. 01/08/14  Yes Lahoma Rocker, PA  cetirizine (ZYRTEC) 10 MG tablet Take 1 tablet (10 mg total) by mouth daily. 01/08/14  Yes Annett Gula Presson, PA  predniSONE (DELTASONE) 20 MG tablet Take 20 mg by mouth daily.   Yes Historical Provider, MD   BP 171/77  Pulse 101  Temp(Src) 98.5 F (36.9 C) (Oral)  Resp 18  Ht 6\' 5"  (1.956 m)  Wt 260 lb (117.935 kg)  BMI 30.83 kg/m2  SpO2 96% Physical Exam  Nursing note and vitals reviewed. Constitutional: He is oriented to person, place, and time. He appears well-developed and well-nourished. No distress.  HENT:  Head: Normocephalic and atraumatic.  Mouth/Throat: Oropharynx is clear and moist.  Eyes: Conjunctivae and EOM are normal. Pupils are equal, round, and reactive to light.  Neck: Normal range of motion. Neck supple.  Cardiovascular: Normal rate, regular rhythm and intact distal pulses.   No murmur heard. Pulmonary/Chest: Tachypnea noted. No respiratory distress. He has wheezes in the right upper field, the  right middle field, the right lower field, the left upper field, the left middle field and the left lower field. He has no rales.  Abdominal: Soft. He exhibits no distension. There is no tenderness. There is no rebound and no guarding.  Musculoskeletal: Normal range of motion. He exhibits no edema and no tenderness.  Neurological: He is alert and oriented to person, place, and time.  Skin: Skin is warm and dry. Rash noted. Rash is maculopapular and pustular. There is erythema.       Scaling, peeling, pustular circular rash on the dorsum of the hands and the top of the feet.  Non-tender and only mild erythema.  No drainage.  Psychiatric: He has a normal mood and affect. His behavior is normal.    ED Course  Procedures (including critical care time) Labs Review Labs Reviewed  CBC WITH DIFFERENTIAL - Abnormal; Notable for the following:    RBC 4.18 (*)    Hemoglobin 12.4 (*)    HCT 37.5 (*)    Eosinophils Relative 6 (*)    All other components within normal limits  I-STAT CHEM 8, ED  Randolm Idol, ED    Imaging Review Dg Chest 2 View  01/17/2014   CLINICAL DATA:  Short of breath.  Asthma.  Smoking history.  EXAM: CHEST  2 VIEW  COMPARISON:  01/25/2013 and previous.  FINDINGS: Heart size is normal. The aorta is slightly unfolded. The lungs are clear. No definite bronchial thickening. No infiltrate, collapse or effusion. No significant bony finding.  IMPRESSION: No active disease   Electronically Signed   By: Nelson Chimes M.D.   On: 01/17/2014 21:35     EKG Interpretation None      MDM   Final diagnoses:  Asthma exacerbation  Tinea    Patient with a history of asthma who one week ago was having an asthma exacerbation and given an inhaler and sent home. He had some prednisone left over from one year ago that he's been taking this week and today symptoms worsened. When EMS arrived patient was satting 94% on room air with diffuse wheezing and tachypnea. He was given albuterol and Atrovent in route with improvement in symptoms. Currently patient is still having wheezing but able to speak in full sentences and satting 96% on room air. He denies any infectious symptoms and most likely asthma exacerbation related to environmental exposures. Itching given a dose of prednisone as he has not taken one today and will repeat nebs. CBC, i-STAT, chest x-ray pending.  Secondly patient is complaining of a rash that has worsened over the last week since he started taking  prednisone. Based on physical exam patient has a fungal infection of his hand and his feet that is most likely being worsened by the prednisone. He has tried Lotrimin cream on his feet without improvement.  10:27 PM Labs unremarkable and CXR without acute pathology.  On re-eval pt feeling much better adn wheezing has resolved.  Will send home with steroids, inhaler and diflucan for fungal rash.  Blanchie Dessert, MD 01/17/14 2238

## 2014-10-05 HISTORY — PX: COLONOSCOPY: SHX174

## 2014-10-05 HISTORY — PX: POLYPECTOMY: SHX149

## 2014-11-27 ENCOUNTER — Emergency Department (HOSPITAL_COMMUNITY): Payer: BLUE CROSS/BLUE SHIELD

## 2014-11-27 ENCOUNTER — Encounter (HOSPITAL_COMMUNITY): Payer: Self-pay | Admitting: Emergency Medicine

## 2014-11-27 ENCOUNTER — Emergency Department (HOSPITAL_COMMUNITY)
Admission: EM | Admit: 2014-11-27 | Discharge: 2014-11-27 | Disposition: A | Discharge status: Home / Self Care | Payer: BLUE CROSS/BLUE SHIELD | Attending: Emergency Medicine | Admitting: Emergency Medicine

## 2014-11-27 DIAGNOSIS — J45901 Unspecified asthma with (acute) exacerbation: Secondary | ICD-10-CM | POA: Insufficient documentation

## 2014-11-27 DIAGNOSIS — Z7952 Long term (current) use of systemic steroids: Secondary | ICD-10-CM | POA: Diagnosis not present

## 2014-11-27 DIAGNOSIS — R0602 Shortness of breath: Secondary | ICD-10-CM

## 2014-11-27 DIAGNOSIS — Z87891 Personal history of nicotine dependence: Secondary | ICD-10-CM | POA: Diagnosis not present

## 2014-11-27 DIAGNOSIS — Z79899 Other long term (current) drug therapy: Secondary | ICD-10-CM | POA: Insufficient documentation

## 2014-11-27 LAB — CBC
HCT: 39.5 % (ref 39.0–52.0)
HEMOGLOBIN: 12.8 g/dL — AB (ref 13.0–17.0)
MCH: 28.8 pg (ref 26.0–34.0)
MCHC: 32.4 g/dL (ref 30.0–36.0)
MCV: 89 fL (ref 78.0–100.0)
Platelets: 239 10*3/uL (ref 150–400)
RBC: 4.44 MIL/uL (ref 4.22–5.81)
RDW: 13.6 % (ref 11.5–15.5)
WBC: 8.2 10*3/uL (ref 4.0–10.5)

## 2014-11-27 LAB — I-STAT TROPONIN, ED: TROPONIN I, POC: 0 ng/mL (ref 0.00–0.08)

## 2014-11-27 MED ORDER — ALBUTEROL SULFATE HFA 108 (90 BASE) MCG/ACT IN AERS: 1.0000 | INHALATION_SPRAY | RESPIRATORY_TRACT | Status: DC | PRN

## 2014-11-27 MED ORDER — IPRATROPIUM BROMIDE 0.02 % IN SOLN
1.0000 mg | Freq: Once | RESPIRATORY_TRACT | Status: AC
  Administered 2014-11-27: 1 mg via RESPIRATORY_TRACT
  Filled 2014-11-27: qty 5

## 2014-11-27 MED ORDER — ALBUTEROL (5 MG/ML) CONTINUOUS INHALATION SOLN
10.0000 mg/h | INHALATION_SOLUTION | Freq: Once | RESPIRATORY_TRACT | Status: AC
  Administered 2014-11-27: 10 mg/h via RESPIRATORY_TRACT
  Filled 2014-11-27: qty 20

## 2014-11-27 MED ORDER — PREDNISONE 20 MG PO TABS: 60.0000 mg | ORAL_TABLET | Freq: Every day | ORAL | Status: DC

## 2014-11-27 MED ORDER — METHYLPREDNISOLONE SODIUM SUCC 125 MG IJ SOLR
125.0000 mg | Freq: Once | INTRAMUSCULAR | Status: AC
  Administered 2014-11-27: 125 mg via INTRAVENOUS
  Filled 2014-11-27: qty 2

## 2014-11-27 MED ORDER — ALBUTEROL SULFATE HFA 108 (90 BASE) MCG/ACT IN AERS
2.0000 | INHALATION_SPRAY | Freq: Once | RESPIRATORY_TRACT | Status: AC
  Administered 2014-11-27: 2 via RESPIRATORY_TRACT
  Filled 2014-11-27: qty 6.7

## 2014-11-27 MED ORDER — IPRATROPIUM-ALBUTEROL 0.5-2.5 (3) MG/3ML IN SOLN
RESPIRATORY_TRACT | Status: AC
  Administered 2014-11-27: 3 mL
  Filled 2014-11-27: qty 9

## 2014-11-27 NOTE — ED Notes (Signed)
Pt has no c/o pain, stable, ambulatory.

## 2014-11-27 NOTE — ED Provider Notes (Signed)
CSN: [id number]     Arrival date & time 11/27/14  1803 History   First MD Initiated Contact with Patient 11/27/14 1807     Chief Complaint  Patient presents with  . Shortness of Breath     (Consider location/radiation/quality/duration/timing/severity/associated sxs/prior Treatment) Patient is a 54 y.o. male presenting with shortness of breath.  Shortness of Breath Severity:  Severe Onset quality:  Sudden Duration:  3 hours Timing:  Constant Progression:  Worsening Chronicity:  Recurrent Context: weather changes   Relieved by:  None tried Worsened by:  Exertion and movement Ineffective treatments:  None tried Associated symptoms: cough and wheezing   Associated symptoms: no abdominal pain, no chest pain, no diaphoresis, no fever, no neck pain, no rash, no sore throat, no sputum production and no vomiting   Risk factors: no hx of PE/DVT   Risk factors comment:  History of asthma, off all medications, with no inhaler at home      Past Medical History  Diagnosis Date  . Asthma    History reviewed. No pertinent past surgical history. Family History  Problem Relation Age of Onset  . Heart failure Mother     heart attack   History  Substance Use Topics  . Smoking status: Former Smoker -- 0.10 packs/day    Types: Cigarettes  . Smokeless tobacco: Not on file  . Alcohol Use: 2.4 oz/week    4 Cans of beer per week    Review of Systems  Constitutional: Negative for fever, chills and diaphoresis.  HENT: Negative for congestion, rhinorrhea and sore throat.   Eyes: Negative for visual disturbance.  Respiratory: Positive for cough, shortness of breath and wheezing. Negative for sputum production.   Cardiovascular: Negative for chest pain.  Gastrointestinal: Negative for nausea, vomiting, abdominal pain and diarrhea.  Musculoskeletal: Negative for neck pain and neck stiffness.  Skin: Negative for rash.  Allergic/Immunologic: Negative for immunocompromised state.   Neurological: Negative for syncope and weakness.      Allergies  Shellfish allergy  Home Medications   Prior to Admission medications   Medication Sig Start Date End Date Taking? Authorizing Provider  albuterol (PROVENTIL HFA;VENTOLIN HFA) 108 (90 BASE) MCG/ACT inhaler Inhale 1-2 puffs into the lungs every 6 (six) hours as needed for wheezing or shortness of breath. 01/08/14   Lutricia Feil, PA  cetirizine (ZYRTEC) 10 MG tablet Take 1 tablet (10 mg total) by mouth daily. 01/08/14   Audelia Hives Presson, PA  predniSONE (DELTASONE) 20 MG tablet Take 20 mg by mouth daily.    Historical Provider, MD  predniSONE (DELTASONE) 20 MG tablet Take 2 tablets (40 mg total) by mouth daily. 01/17/14   Blanchie Dessert, MD   BP 159/71 mmHg  Pulse 80  Temp(Src) 98.1 F (36.7 C) (Oral)  Resp 20  Ht 6\' 5"  (1.956 m)  Wt 278 lb (126.1 kg)  BMI 32.96 kg/m2  SpO2 100% Physical Exam  Constitutional: He is oriented to person, place, and time. He appears well-developed and well-nourished. He appears distressed (mild respiratory distress).  Non-toxic  HENT:  Head: Normocephalic and atraumatic.  Eyes: Conjunctivae are normal. Pupils are equal, round, and reactive to light.  Neck: Neck supple. No JVD present.  Cardiovascular: Normal rate, normal heart sounds and intact distal pulses.   No murmur heard. Pulmonary/Chest: Accessory muscle usage present. No stridor. Tachypnea noted. He has decreased breath sounds. He has wheezes (diffuse, primarily expiratory). He has no rhonchi. He has no rales.  Abdominal: Soft. Bowel  sounds are normal. He exhibits no distension. There is no tenderness.  Musculoskeletal: He exhibits no edema.  Neurological: He is alert and oriented to person, place, and time.  Skin: Skin is warm. No rash noted.  Nursing note and vitals reviewed.   ED Course  Procedures (including critical care time) Labs Review Labs Reviewed  CBC - Abnormal; Notable for the following:     Hemoglobin 12.8 (*)    All other components within normal limits  I-STAT CHEM 8, ED  I-STAT TROPOININ, ED    Imaging Review No results found.   EKG Interpretation   Date/Time:  Tuesday November 27 2014 18:15:39 EST Ventricular Rate:  83 PR Interval:  165 QRS Duration: 82 QT Interval:  362 QTC Calculation: 425 R Axis:   58 Text Interpretation:  Sinus rhythm Borderline repolarization abnormality  Baseline wander in lead(s) II aVF Confirmed by Alvino Chapel  MD, Ovid Curd  [phone]) on 11/27/2014 6:18:28 PM      MDM   Final diagnoses:  SOB (shortness of breath)    54 yo M with PMHx of HTN, asthma, not currently on any controller medications 2/2 lack of PCP f/u, who presents with acute onset wheezing, SOB, and dry cough in setting of weather changes. See HPI above. On arrival, T 98.102F, HR 80s, RR 20-24, BP 169/90s, satting 96% on RA. Exam as above, remarkable for mild tachypnea, poor air movement, and diffuse wheezing. Heart RRR, pulses symmetric.  Pt's presentation is most c/f acute asthma exacerbation. Pt has known asthma with known trigger and states his sx are similar to his prior episodes. Of note, he has not seen a PCP in >1 yr and has not had any albuterol, steroids, or treatment since his last ED visit. Will start duoneb, give solumedrol, and re-assess. Pt satting well on RA at this time, suspect he will able to be reversed with beta-agonist. No focal findings to suggest PNA but will send CXR. No known h/o CHF. No CP, EKG shows likely repol but will check basic labs and troponin.   Labs negative and troponin undetectable. CXR clear. Patient now with normal WOB, clear lungs after albuterol/ipratropium neb. Will monitor.  Pt remains clear with normal WOB, normal RR >1 hr after neb. Will give MDI, d/c with steroid burst, and PCP f/u. Rreiterated the importance of close PCP follow-up and need for continued asthma management.  Clinical Impression: 1. Asthma exacerbation   2. SOB  (shortness of breath)     Disposition: Discharge  Condition: Good  I have discussed the results, Dx and Tx plan with the pt(& family if present). He/she/they expressed understanding and agree(s) with the plan. Discharge instructions discussed at great length. Strict return precautions discussed and pt &/or family have verbalized understanding of the instructions. No further questions at time of discharge.   Follow Up: [place]     [address] [place] [id number] [phone]  Call to set up an appointment with a PCP. It is VERY important that you do this.   Pt seen in conjunction with Dr. Zandra Abts, MD 11/28/14 [place]. Alvino Chapel, MD 11/30/14 [phone]

## 2014-11-27 NOTE — ED Notes (Signed)
Pt got off work at [employer], ate dinner and started feeling SOB. Pt presented to the ED very SOB, audibly wheezing and working to breathe. Pt unable to complete sentences.

## 2015-01-15 ENCOUNTER — Ambulatory Visit: Payer: BLUE CROSS/BLUE SHIELD | Admitting: Internal Medicine

## 2015-01-23 ENCOUNTER — Ambulatory Visit: Payer: BLUE CROSS/BLUE SHIELD | Attending: Internal Medicine | Admitting: Internal Medicine

## 2015-01-23 ENCOUNTER — Encounter: Payer: Self-pay | Admitting: Internal Medicine

## 2015-01-23 VITALS — BP 160/100 | HR 75 | Temp 98.0°F | Resp 16 | Ht 77.0 in | Wt 294.0 lb

## 2015-01-23 DIAGNOSIS — L309 Dermatitis, unspecified: Secondary | ICD-10-CM

## 2015-01-23 DIAGNOSIS — M25522 Pain in left elbow: Secondary | ICD-10-CM

## 2015-01-23 DIAGNOSIS — J4530 Mild persistent asthma, uncomplicated: Secondary | ICD-10-CM

## 2015-01-23 DIAGNOSIS — I10 Essential (primary) hypertension: Secondary | ICD-10-CM | POA: Diagnosis not present

## 2015-01-23 DIAGNOSIS — M25512 Pain in left shoulder: Secondary | ICD-10-CM

## 2015-01-23 DIAGNOSIS — Z1211 Encounter for screening for malignant neoplasm of colon: Secondary | ICD-10-CM | POA: Diagnosis not present

## 2015-01-23 DIAGNOSIS — Z87891 Personal history of nicotine dependence: Secondary | ICD-10-CM | POA: Diagnosis not present

## 2015-01-23 MED ORDER — LOSARTAN POTASSIUM 50 MG PO TABS: 50.0000 mg | ORAL_TABLET | Freq: Every day | ORAL | Status: DC

## 2015-01-23 MED ORDER — FLUTICASONE PROPIONATE (INHAL) 50 MCG/BLIST IN AEPB
1.0000 | INHALATION_SPRAY | Freq: Two times a day (BID) | RESPIRATORY_TRACT | Status: DC
Start: 2015-01-23 — End: 2016-08-07

## 2015-01-23 MED ORDER — TRAMADOL HCL 50 MG PO TABS: 50.0000 mg | ORAL_TABLET | Freq: Two times a day (BID) | ORAL | Status: DC | PRN

## 2015-01-23 NOTE — Progress Notes (Signed)
Pt is here to establish care. Pt has a history of asthma. Pt states that he gets pain in his shoulders and elbows. Pt gets sharp pains in his left foot. Pt has skin rashes all over.

## 2015-01-23 NOTE — Progress Notes (Signed)
Patient ID: NAINOA WOLDT, male   DOB: Aug 13, 1961, 54 y.o.   MRN: [id number]  ZJQ:[id number]  [id number]:[id number]  DOB - 1961/01/18  CC:  Chief Complaint  Patient presents with  . Establish Care       HPI: Ireland Chagnon is a 54 y.o. male here today to establish medical care.  Patient has past medical history of asthma. Today he c/o of pain in his left shoulders for past couple of days. Pain is aggravated by moving arm up above his head. Pain described as a ache and it catches. No swelling or injuries. He reports bilateral elbow pain for 6 months, similar to when he had tendonitis. He also c/o left foot pain in the arch of his foot that intermittently flare. He has tried ibuprofen and tylenol.  He reports a rash on his BUE only that are occasionally itchy. He denies any new products or exposures. Asthma exacerbations only when weather changes. He has been using albuterol for symptoms relief. Albuterol use 6-7 times per day with night time coughing and wheezing.   Allergies  Allergen Reactions  . Shellfish Allergy     hives   Past Medical History  Diagnosis Date  . Asthma    Current Outpatient Prescriptions on File Prior to Visit  Medication Sig Dispense Refill  . albuterol (PROVENTIL HFA;VENTOLIN HFA) 108 (90 BASE) MCG/ACT inhaler Inhale 1-2 puffs into the lungs every 6 (six) hours as needed for wheezing or shortness of breath. 1 Inhaler 0  . albuterol (PROVENTIL HFA;VENTOLIN HFA) 108 (90 BASE) MCG/ACT inhaler Inhale 1-2 puffs into the lungs every 4 (four) hours as needed for wheezing or shortness of breath. (Patient not taking: Reported on 01/23/2015) 1 Inhaler 0  . cetirizine (ZYRTEC) 10 MG tablet Take 1 tablet (10 mg total) by mouth daily. (Patient not taking: Reported on 01/23/2015) 30 tablet 0  . predniSONE (DELTASONE) 20 MG tablet Take 3 tablets (60 mg total) by mouth daily. (Patient not taking: Reported on 01/23/2015) 15 tablet 0   No current facility-administered medications on file prior  to visit.   Family History  Problem Relation Age of Onset  . Heart failure Mother     heart attack   History   Social History  . Marital Status: Single    Spouse Name: N/A  . Number of Children: N/A  . Years of Education: N/A   Occupational History  . Not on file.   Social History Main Topics  . Smoking status: Former Smoker -- 0.10 packs/day    Types: Cigarettes  . Smokeless tobacco: Not on file  . Alcohol Use: 2.4 oz/week    4 Cans of beer per week  . Drug Use: No  . Sexual Activity: Not on file   Other Topics Concern  . Not on file   Social History Narrative    Review of Systems  Constitutional: Negative for fever and chills.  Respiratory: Positive for cough, shortness of breath and wheezing. Negative for hemoptysis and sputum production.   Skin: Positive for itching and rash.  All other systems reviewed and are negative.     Objective:   Filed Vitals:   01/23/15 1619  BP: 160/100  Pulse: 75  Temp: 98 F (36.7 C)  Resp: 16    Physical Exam: Constitutional: Patient appears well-developed and well-nourished. No distress. HENT: Normocephalic, atraumatic, External right and left ear normal. Oropharynx is clear and moist.  Eyes: Conjunctivae and EOM are normal. PERRLA, no scleral icterus. Neck: Normal ROM. Neck  supple. No JVD. No tracheal deviation. No thyromegaly. CVS: RRR, S1/S2 +, no murmurs, no gallops, no carotid bruit.  Pulmonary: Effort and breath sounds normal, no stridor, rhonchi, wheezes, rales.  Abdominal: Soft. BS +, no distension, tenderness, rebound or guarding.  Musculoskeletal: Normal range of motion. No edema and no tenderness.  Lymphadenopathy: No lymphadenopathy noted, cervical, inguinal or axillary Neuro: Alert. Normal reflexes, muscle tone coordination. No cranial nerve deficit. Skin: Skin is warm and dry. No rash noted. Not diaphoretic. No erythema. No pallor. Psychiatric: Normal mood and affect. Behavior, judgment, thought content  normal.  Lab Results  Component Value Date   WBC 8.2 11/27/2014   HGB 12.8* 11/27/2014   HCT 39.5 11/27/2014   MCV 89.0 11/27/2014   PLT 239 11/27/2014   Lab Results  Component Value Date   CREATININE 0.95 01/27/2012   BUN 18 01/27/2012   NA 138 01/27/2012   K 4.5 01/27/2012   CL 104 01/27/2012   CO2 25 01/27/2012    No results found for: HGBA1C Lipid Panel  No results found for: CHOL, TRIG, HDL, CHOLHDL, VLDL, LDLCALC     Assessment and plan:   Ashwath was seen today for establish care.  Diagnoses and all orders for this visit:  Essential hypertension---new Orders: -    Begin losartan (COZAAR) 50 MG tablet; Take 1 tablet (50 mg total) by mouth daily.  Explained to patient diet modifications that may contribute to elevated BP. Patient will avoid foods that are high in sodium such as  canned soups and vegetables, tomato juice, commercial baked goods, commercially prepared frozen or canned entrees and sauces. Avoid salty snacks, added salt when cooking, and substituting for low sodium herbs or spices Explained exercise regimen of cardio at least three times weekly to help lower BP and cholesterol  Asthma, mild persistent, uncomplicated Orders: -     Begin fluticasone (FLOVENT DISKUS) 50 MCG/BLIST diskus inhaler; Inhale 1 puff into the lungs 2 (two) times daily. May help to use zyrtec daily   Left shoulder pain/Left elbow pain Orders: -     traMADol (ULTRAM) 50 MG tablet; Take 1 tablet (50 mg total) by mouth every 12 (twelve) hours as needed. For pain -     Ambulatory referral to Orthopedics  Eczema Orders: -     Ambulatory referral to Dermatology---per patient request  Colon cancer screening Orders: -     [facility] COLONOSCOPY    Return in about 2 weeks (around 02/06/2015) for Nurse Visit-BP check and 3 mo PCP HTN.   Chari Manning, [facility] [phone] 01/23/2015, 4:25 PM

## 2015-01-23 NOTE — Patient Instructions (Signed)

## 2015-02-07 ENCOUNTER — Ambulatory Visit: Payer: BLUE CROSS/BLUE SHIELD | Attending: Internal Medicine

## 2015-02-07 VITALS — BP 170/101 | HR 60

## 2015-02-07 DIAGNOSIS — I10 Essential (primary) hypertension: Secondary | ICD-10-CM | POA: Insufficient documentation

## 2015-02-07 DIAGNOSIS — Z1211 Encounter for screening for malignant neoplasm of colon: Secondary | ICD-10-CM

## 2015-02-07 MED ORDER — LOSARTAN POTASSIUM 100 MG PO TABS: 100.0000 mg | ORAL_TABLET | Freq: Every day | ORAL | Status: DC

## 2015-02-07 NOTE — Progress Notes (Signed)
Patient here today for blood pressure check.  He is taking losartan 50mg  daily in the morning.  He says he is taking as directed and is feeling well.  Consulted with Chari Manning, NP regarding elevated pressure.  She wants to increase his losartan to 100mg .  Patient aware and rx sent to pharmacy.  Patient would also like a referral for a colonoscopy.  Patient advised to follow up in 3-4 weeks.

## 2015-02-08 ENCOUNTER — Encounter: Payer: Self-pay | Admitting: Internal Medicine

## 2015-02-11 ENCOUNTER — Ambulatory Visit (INDEPENDENT_AMBULATORY_CARE_PROVIDER_SITE_OTHER): Payer: BLUE CROSS/BLUE SHIELD | Admitting: Family Medicine

## 2015-02-11 ENCOUNTER — Encounter: Payer: Self-pay | Admitting: Family Medicine

## 2015-02-11 VITALS — BP 164/94 | Ht 77.0 in | Wt 294.0 lb

## 2015-02-11 DIAGNOSIS — Z720 Tobacco use: Secondary | ICD-10-CM

## 2015-02-11 DIAGNOSIS — F1099 Alcohol use, unspecified with unspecified alcohol-induced disorder: Secondary | ICD-10-CM

## 2015-02-11 DIAGNOSIS — F17200 Nicotine dependence, unspecified, uncomplicated: Secondary | ICD-10-CM

## 2015-02-11 DIAGNOSIS — Z789 Other specified health status: Secondary | ICD-10-CM

## 2015-02-11 DIAGNOSIS — I10 Essential (primary) hypertension: Secondary | ICD-10-CM | POA: Diagnosis not present

## 2015-02-11 DIAGNOSIS — Z7289 Other problems related to lifestyle: Secondary | ICD-10-CM

## 2015-02-11 DIAGNOSIS — M2550 Pain in unspecified joint: Secondary | ICD-10-CM

## 2015-02-11 NOTE — Patient Instructions (Signed)
I would recommend 1. Starting a generic brand of a centrum silver multivitamin daily 2. Decrease your alcohol intake 3. Avoid tobacco as much as possible 4. Follow up about your blood pressure---you will likely need a second agent 5. Ask your doctor to check you for diabetes and to check your kidney function

## 2015-02-12 ENCOUNTER — Encounter: Payer: Self-pay | Admitting: Family Medicine

## 2015-02-12 DIAGNOSIS — Z789 Other specified health status: Secondary | ICD-10-CM | POA: Insufficient documentation

## 2015-02-12 DIAGNOSIS — I10 Essential (primary) hypertension: Secondary | ICD-10-CM | POA: Insufficient documentation

## 2015-02-12 DIAGNOSIS — F1090 Alcohol use, unspecified, uncomplicated: Secondary | ICD-10-CM

## 2015-02-12 DIAGNOSIS — F17200 Nicotine dependence, unspecified, uncomplicated: Secondary | ICD-10-CM | POA: Insufficient documentation

## 2015-02-12 DIAGNOSIS — Z87891 Personal history of nicotine dependence: Secondary | ICD-10-CM | POA: Insufficient documentation

## 2015-02-12 DIAGNOSIS — Z7289 Other problems related to lifestyle: Secondary | ICD-10-CM | POA: Insufficient documentation

## 2015-02-12 DIAGNOSIS — M2550 Pain in unspecified joint: Secondary | ICD-10-CM | POA: Insufficient documentation

## 2015-02-12 HISTORY — DX: Other specified health status: Z78.9

## 2015-02-12 HISTORY — DX: Alcohol use, unspecified, uncomplicated: F10.90

## 2015-02-12 NOTE — Assessment & Plan Note (Signed)
Brief counseling regarding alcohol use. He says he would be able to stop without any consequences. I urged him to either stop or at least significantly decrease. This most likely is also affecting his blood pressure.

## 2015-02-12 NOTE — Progress Notes (Signed)
Patient ID: Edward Wilson, male   DOB: Jan 24, 1961, 54 y.o.   MRN: [id number]  Edward Wilson - 54 y.o. male MRN [id number]  Date of birth: 03-26-1961    SUBJECTIVE:     Complaint of joint pains. These are transient, occur at any time. Significant pain as in 10 out of 10 when they occur. Most commonly occurring elbows, left shoulder, left ankle and foot. The ones in his elbows occur most commonly at night. The left foot and ankle happen usually when he is walking, and it hurt so bad he has to stop and standstill. They self resolve and usually less than 1 minute. He's had these for years. He has not had medical care for some time and has never had any type of workup for these that he is aware of. He has not noticed any joint stiffness other than some in his elbows that is intermittent and not associated with pain but is more prominent at night. He's noted no swelling, no warmth or erythema of any of the joints that are involved in this type of arthralgia. ROS:     He's had no unusual weight change, fever, sweats, chills. He's had no rash. He's had no specific weakness of any muscle group, no unusual fatigue, no shortness of breath, no edema in hands or extremities. No unusual sensitivity to cold or heat.  PERTINENT  PMH / PSH FH / / SH:  Past Medical, Surgical, Social, and Family History Reviewed & Updated in the EMR.  Pertinent findings include:  No history of specific injury in any of the specific joints listed in history of present illness, no history of surgery on those joints. He recently was diagnosed with hypertension. He does not think he's been evaluated for diabetes. He is an intermittent but fairly frequent smoker of cigarettes. He drinks approximately 120 ounces of beer or more a week, drinking 40 ounces beers typically.  OBJECTIVE: BP 164/94 mmHg  Ht 6\' 5"  (1.956 m)  Wt 294 lb (133.358 kg)  BMI 34.86 kg/m2  Physical Exam:  Vital signs are reviewed. Vital signs reviewed. GENERAL:  Well-developed, well-nourished, no acute distress. CARDIOVASCULAR: Regular rate and rhythm no murmur gallop or rub LUNGS: Clear to auscultation bilaterally, no rales or wheeze. ABDOMEN: Soft positive bowel sounds NEURO: No gross focal neurological deficits.He has intact sensation to soft touch in his hands and feet. MSK: Movement of extremity x 4. There is no tenderness, warmth, erythema or deformity noted of elbows, left shoulder, left ankle, left foot. He has full range of motion in all his joints.  He can rise from a chair without assistance, normal gait.    ASSESSMENT & PLAN:  See problem based charting & AVS for pt instructions.

## 2015-02-12 NOTE — Assessment & Plan Note (Signed)
Evidently recently diagnosed with hypertension. By his report they have not yet that any blood work at looking at kidney function or electrolytes. I started him on 50 mg of Cozaar him have increased him to 100 mg. By his chart review he has had several blood pressure readings with systolic greater than 127 so I think he would easily qualify for combination therapy. ( per JNC7)   I discussed this briefly with him. Think would also be nice to check his blood sugar given his fairly high alcohol intake. I briefly discussed these issues with him and will refer theses issues---and him-- f back to his PCP.

## 2015-02-12 NOTE — Assessment & Plan Note (Signed)
Doubt that this is any type of rheumatological condition. 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 I don't get any sense that there is a specific area of osteoarthritis. I don't think this is related to any systemic disease. I do note that he is drinking quite a bit of alcohol a week and potentially could have some electrolyte abnormalities and low folate associated with that so I did recommend that he significantly decrease his drinking, adding multivitamin daily.

## 2015-02-12 NOTE — Assessment & Plan Note (Signed)
Recommended decrease. He'll think he's had one or he's ready consider cessation. Brief counseling.

## 2015-02-25 ENCOUNTER — Ambulatory Visit (AMBULATORY_SURGERY_CENTER): Payer: Self-pay

## 2015-02-25 VITALS — Ht 77.0 in | Wt 297.0 lb

## 2015-02-25 DIAGNOSIS — Z1211 Encounter for screening for malignant neoplasm of colon: Secondary | ICD-10-CM

## 2015-02-25 MED ORDER — MOVIPREP 100 G PO SOLR: 1.0000 | Freq: Once | ORAL | Status: DC

## 2015-02-25 NOTE — Progress Notes (Signed)
No allergies to eggs or soy No diet/weight loss meds No home oxygen No past exposure to anesthesia  No email  No computer

## 2015-03-11 ENCOUNTER — Ambulatory Visit (AMBULATORY_SURGERY_CENTER): Payer: BLUE CROSS/BLUE SHIELD | Admitting: Internal Medicine

## 2015-03-11 ENCOUNTER — Encounter: Payer: Self-pay | Admitting: Internal Medicine

## 2015-03-11 VITALS — BP 164/96 | HR 62 | Temp 97.4°F | Resp 24 | Ht 77.0 in | Wt 297.0 lb

## 2015-03-11 DIAGNOSIS — D122 Benign neoplasm of ascending colon: Secondary | ICD-10-CM | POA: Diagnosis not present

## 2015-03-11 DIAGNOSIS — Z1211 Encounter for screening for malignant neoplasm of colon: Secondary | ICD-10-CM

## 2015-03-11 MED ORDER — SODIUM CHLORIDE 0.9 % IV SOLN: 500.0000 mL | INTRAVENOUS | Status: DC

## 2015-03-11 NOTE — Progress Notes (Signed)
A/ox3, pleased with MAC, report to RN 

## 2015-03-11 NOTE — Progress Notes (Signed)
Called to room to assist during endoscopic procedure.  Patient ID and intended procedure confirmed with present staff. Received instructions for my participation in the procedure from the performing physician.  

## 2015-03-11 NOTE — Patient Instructions (Signed)
YOU HAD AN ENDOSCOPIC PROCEDURE TODAY AT [facility]:   Refer to the procedure report that was given to you for any specific questions about what was found during the examination.  If the procedure report does not answer your questions, please call your gastroenterologist to clarify.  If you requested that your care partner not be given the details of your procedure findings, then the procedure report has been included in a sealed envelope for you to review at your convenience later.  YOU SHOULD EXPECT: Some feelings of bloating in the abdomen. Passage of more gas than usual.  Walking can help get rid of the air that was put into your GI tract during the procedure and reduce the bloating. If you had a lower endoscopy (such as a colonoscopy or flexible sigmoidoscopy) you may notice spotting of blood in your stool or on the toilet paper. If you underwent a bowel prep for your procedure, you may not have a normal bowel movement for a few days.  Please Note:  You might notice some irritation and congestion in your nose or some drainage.  This is from the oxygen used during your procedure.  There is no need for concern and it should clear up in a day or so.  SYMPTOMS TO REPORT IMMEDIATELY:   Following lower endoscopy (colonoscopy or flexible sigmoidoscopy):  Excessive amounts of blood in the stool  Significant tenderness or worsening of abdominal pains  Swelling of the abdomen that is new, acute  Fever of 100F or higher   For urgent or emergent issues, a gastroenterologist can be reached at any hour by calling [phone].   DIET: Your first meal following the procedure should be a small meal and then it is ok to progress to your normal diet. Heavy or fried foods are harder to digest and may make you feel nauseous or bloated.  Likewise, meals heavy in dairy and vegetables can increase bloating.  Drink plenty of fluids but you should avoid alcoholic beverages for 24 hours. Try to  eat more fiber in your diet.  ACTIVITY:  You should plan to take it easy for the rest of today and you should NOT DRIVE or use heavy machinery until tomorrow (because of the sedation medicines used during the test).    FOLLOW UP: Our staff will call the number listed on your records the next business day following your procedure to check on you and address any questions or concerns that you may have regarding the information given to you following your procedure. If we do not reach you, we will leave a message.  However, if you are feeling well and you are not experiencing any problems, there is no need to return our call.  We will assume that you have returned to your regular daily activities without incident.  If any biopsies were taken you will be contacted by phone or by letter within the next 1-3 weeks.  Please call us at [phone] if you have not heard about the biopsies in 3 weeks.    SIGNATURES/CONFIDENTIALITY: You and/or your care partner have signed paperwork which will be entered into your electronic medical record.  These signatures attest to the fact that that the information above on your After Visit Summary has been reviewed and is understood.  Full responsibility of the confidentiality of this discharge information lies with you and/or your care-partner.  Read all of the handouts given to you by your recovery room nurse.

## 2015-03-11 NOTE — Op Note (Signed)
[facility]  [address] [place], [id number]   COLONOSCOPY PROCEDURE REPORT  PATIENT: Edward Wilson, Edward Wilson  MR#: [id number] BIRTHDATE: Feb 27, 1961 , 9  yrs. old GENDER: male ENDOSCOPIST: Eustace Quail, MD REFERRED BM:WUXLKGM Keck, NP. PROCEDURE DATE:  03/11/2015 PROCEDURE:   Colonoscopy, screening and Colonoscopy with snare polypectomy x 1 First Screening Colonoscopy - Avg.  risk and is 50 yrs.  old or older Yes.  Prior Negative Screening - Now for repeat screening. N/A  History of Adenoma - Now for follow-up colonoscopy & has been > or = to 3 yrs.  N/A  Polyps removed today? Yes ASA CLASS:   Class II INDICATIONS:Screening for colonic neoplasia and Colorectal Neoplasm Risk Assessment for this procedure is average risk. MEDICATIONS: Monitored anesthesia care and Propofol 250 mg IV  DESCRIPTION OF PROCEDURE:   After the risks benefits and alternatives of the procedure were thoroughly explained, informed consent was obtained.  The digital rectal exam revealed no abnormalities of the rectum.   The LB [id number]  endoscope was introduced through the anus and advanced to the cecum, which was identified by both the appendix and ileocecal valve. No adverse events experienced.   The quality of the prep was good.  (MoviPrep was used)  The instrument was then slowly withdrawn as the colon was fully examined. Estimated blood loss is zero unless otherwise noted in this procedure report.      COLON FINDINGS: A single polyp measuring 3 mm in size was found in the ascending colon.  A polypectomy was performed with a cold snare.  The resection was complete, the polyp tissue was completely retrieved and sent to histology.   There was mild diverticulosis noted in the left colon.   The examination was otherwise normal. Retroflexed views revealed internal hemorrhoids. The time to cecum = 2.2 Withdrawal time = 10.9   The scope was withdrawn and the procedure  completed. COMPLICATIONS: There were no immediate complications.  ENDOSCOPIC IMPRESSION: 1.   Single polyp was found in the ascending colon; polypectomy was performed with a cold snare 2.   Mild diverticulosis was noted in the left colon 3.   The examination was otherwise normal  RECOMMENDATIONS: 1. Repeat colonoscopy in 5 years if polyp adenomatous; otherwise 10 years  eSigned:  Eustace Quail, MD 03/11/2015 3:28 PM   cc: The Patient and Chari Manning, NP

## 2015-03-12 ENCOUNTER — Telehealth: Payer: Self-pay | Admitting: *Deleted

## 2015-03-12 NOTE — Telephone Encounter (Signed)
  Follow up Call-  Call back number 03/11/2015  Post procedure Call Back phone  # [phone]  Permission to leave phone message Yes     Patient questions:  Do you have a fever, pain , or abdominal swelling? No. Pain Score  0 *  Have you tolerated food without any problems? Yes.    Have you been able to return to your normal activities? Yes.    Do you have any questions about your discharge instructions: Diet   No. Medications  No. Follow up visit  No.  Do you have questions or concerns about your Care? No.  Actions: * If pain score is 4 or above: No action needed, pain <4.

## 2015-03-14 ENCOUNTER — Encounter: Payer: Self-pay | Admitting: Internal Medicine

## 2015-03-15 ENCOUNTER — Ambulatory Visit: Payer: BLUE CROSS/BLUE SHIELD | Attending: Internal Medicine | Admitting: *Deleted

## 2015-03-15 VITALS — BP 138/77 | HR 62 | Temp 97.8°F | Resp 16 | Ht 77.0 in | Wt 299.4 lb

## 2015-03-15 DIAGNOSIS — Z720 Tobacco use: Secondary | ICD-10-CM | POA: Insufficient documentation

## 2015-03-15 DIAGNOSIS — Z0000 Encounter for general adult medical examination without abnormal findings: Secondary | ICD-10-CM | POA: Diagnosis present

## 2015-03-15 DIAGNOSIS — I10 Essential (primary) hypertension: Secondary | ICD-10-CM | POA: Insufficient documentation

## 2015-03-15 LAB — POCT GLYCOSYLATED HEMOGLOBIN (HGB A1C): Hemoglobin A1C: 5.9

## 2015-03-15 LAB — GLUCOSE, POCT (MANUAL RESULT ENTRY): POC Glucose: 100 mg/dl — AB (ref 70–99)

## 2015-03-15 MED ORDER — ALBUTEROL SULFATE HFA 108 (90 BASE) MCG/ACT IN AERS: 1.0000 | INHALATION_SPRAY | Freq: Four times a day (QID) | RESPIRATORY_TRACT | Status: DC | PRN

## 2015-03-15 NOTE — Patient Instructions (Signed)
DASH Eating Plan °DASH stands for "Dietary Approaches to Stop Hypertension." The DASH eating plan is a healthy eating plan that has been shown to reduce high blood pressure (hypertension). Additional health benefits may include reducing the risk of type 2 diabetes mellitus, heart disease, and stroke. The DASH eating plan may also help with weight loss. °WHAT DO I NEED TO KNOW ABOUT THE DASH EATING PLAN? °For the DASH eating plan, you will follow these general guidelines: °· Choose foods with a percent daily value for sodium of less than 5% (as listed on the food label). °· Use salt-free seasonings or herbs instead of table salt or sea salt. °· Check with your health care provider or pharmacist before using salt substitutes. °· Eat lower-sodium products, often labeled as "lower sodium" or "no salt added." °· Eat fresh foods. °· Eat more vegetables, fruits, and low-fat dairy products. °· Choose whole grains. Look for the word "whole" as the first word in the ingredient list. °· Choose fish and skinless chicken or turkey more often than red meat. Limit fish, poultry, and meat to 6 oz (170 g) each day. °· Limit sweets, desserts, sugars, and sugary drinks. °· Choose heart-healthy fats. °· Limit cheese to 1 oz (28 g) per day. °· Eat more home-cooked food and less restaurant, buffet, and fast food. °· Limit fried foods. °· Cook foods using methods other than frying. °· Limit canned vegetables. If you do use them, rinse them well to decrease the sodium. °· When eating at a restaurant, ask that your food be prepared with less salt, or no salt if possible. °WHAT FOODS CAN I EAT? °Seek help from a dietitian for individual calorie needs. °Grains °Whole grain or whole wheat bread. Brown rice. Whole grain or whole wheat pasta. Quinoa, bulgur, and whole grain cereals. Low-sodium cereals. Corn or whole wheat flour tortillas. Whole grain cornbread. Whole grain crackers. Low-sodium crackers. °Vegetables °Fresh or frozen vegetables  (raw, steamed, roasted, or grilled). Low-sodium or reduced-sodium tomato and vegetable juices. Low-sodium or reduced-sodium tomato sauce and paste. Low-sodium or reduced-sodium canned vegetables.  °Fruits °All fresh, canned (in natural juice), or frozen fruits. °Meat and Other Protein Products °Ground beef (85% or leaner), grass-fed beef, or beef trimmed of fat. Skinless chicken or turkey. Ground chicken or turkey. Pork trimmed of fat. All fish and seafood. Eggs. Dried beans, peas, or lentils. Unsalted nuts and seeds. Unsalted canned beans. °Dairy °Low-fat dairy products, such as skim or 1% milk, 2% or reduced-fat cheeses, low-fat ricotta or cottage cheese, or plain low-fat yogurt. Low-sodium or reduced-sodium cheeses. °Fats and Oils °Tub margarines without trans fats. Light or reduced-fat mayonnaise and salad dressings (reduced sodium). Avocado. Safflower, olive, or canola oils. Natural peanut or almond butter. °Other °Unsalted popcorn and pretzels. °The items listed above may not be a complete list of recommended foods or beverages. Contact your dietitian for more options. °WHAT FOODS ARE NOT RECOMMENDED? °Grains °White bread. White pasta. White rice. Refined cornbread. Bagels and croissants. Crackers that contain trans fat. °Vegetables °Creamed or fried vegetables. Vegetables in a cheese sauce. Regular canned vegetables. Regular canned tomato sauce and paste. Regular tomato and vegetable juices. °Fruits °Dried fruits. Canned fruit in light or heavy syrup. Fruit juice. °Meat and Other Protein Products °Fatty cuts of meat. Ribs, chicken wings, bacon, sausage, bologna, salami, chitterlings, fatback, hot dogs, bratwurst, and packaged luncheon meats. Salted nuts and seeds. Canned beans with salt. °Dairy °Whole or 2% milk, cream, half-and-half, and cream cheese. Whole-fat or sweetened yogurt. Full-fat   cheeses or blue cheese. Nondairy creamers and whipped toppings. Processed cheese, cheese spreads, or cheese  curds. °Condiments °Onion and garlic salt, seasoned salt, table salt, and sea salt. Canned and packaged gravies. Worcestershire sauce. Tartar sauce. Barbecue sauce. Teriyaki sauce. Soy sauce, including reduced sodium. Steak sauce. Fish sauce. Oyster sauce. Cocktail sauce. Horseradish. Ketchup and mustard. Meat flavorings and tenderizers. Bouillon cubes. Hot sauce. Tabasco sauce. Marinades. Taco seasonings. Relishes. °Fats and Oils °Butter, stick margarine, lard, shortening, ghee, and bacon fat. Coconut, palm kernel, or palm oils. Regular salad dressings. °Other °Pickles and olives. Salted popcorn and pretzels. °The items listed above may not be a complete list of foods and beverages to avoid. Contact your dietitian for more information. °WHERE CAN I FIND MORE INFORMATION? °National Heart, Lung, and [employer] Institute: www.nhlbi.nih.gov/health/health-topics/topics/dash/ °Document Released: 09/10/2011 Document Revised: 02/05/2014 Document Reviewed: 07/26/2013 °ExitCare® Patient Information ©2015 [employer]. This information is not intended to replace advice given to you by your health care provider. Make sure you discuss any questions you have with your health care provider. ° °

## 2015-03-15 NOTE — Progress Notes (Signed)
Patient presents for BP check after increasing losartan to 100 mg daily Med list reviewed; states taking all meds as directed Discussed need for low sodium diet and using Mrs. Dash as alternative to salt Encouraged to choose foods with 5% or less of daily value for sodium. Discussed walking 30 minutes per day for exercise. States will be joining [employer] soon Patient denies headaches, blurred vision, SHOB, chest pain or pressure Reviewed albuterol MDI and flovent and best way to administer these meds including brushing teeth or rinsing mouth with water after taking flovent. Patient smoking 1-2 cigs 3 days per week; states would like to quit and thinks he will be able to once he joins gym States that doctor who performed colonoscopy suggested patient be tested for T2DM  Filed Vitals:   03/15/15 1641  BP: 138/77  Pulse: 62  Temp: 97.8 F (36.6 C)  Resp: 16    CBG 100 3 hours after meal HGB A1C 5.9 Discussed pre-diabetes and ways to prolong diagnosis of diabetes ie. Exercising, losing weight, making low carb choices  Patient advised to call for med refills at least 7 days before running out so as not to go without.  Patient aware that he is to f/u with PCP 3 months from last visit (Due 04/24/15)  Patient given literature on DASH Eating Plan

## 2015-10-10 ENCOUNTER — Encounter: Payer: Self-pay | Admitting: Internal Medicine

## 2015-10-10 ENCOUNTER — Ambulatory Visit: Payer: BLUE CROSS/BLUE SHIELD | Attending: Internal Medicine | Admitting: Internal Medicine

## 2015-10-10 VITALS — BP 140/72 | HR 76 | Temp 98.0°F | Resp 16 | Ht 77.0 in | Wt 299.0 lb

## 2015-10-10 DIAGNOSIS — R109 Unspecified abdominal pain: Secondary | ICD-10-CM | POA: Insufficient documentation

## 2015-10-10 DIAGNOSIS — I10 Essential (primary) hypertension: Secondary | ICD-10-CM | POA: Insufficient documentation

## 2015-10-10 DIAGNOSIS — J45909 Unspecified asthma, uncomplicated: Secondary | ICD-10-CM | POA: Diagnosis not present

## 2015-10-10 DIAGNOSIS — F17210 Nicotine dependence, cigarettes, uncomplicated: Secondary | ICD-10-CM | POA: Insufficient documentation

## 2015-10-10 DIAGNOSIS — Z79899 Other long term (current) drug therapy: Secondary | ICD-10-CM | POA: Insufficient documentation

## 2015-10-10 DIAGNOSIS — M25552 Pain in left hip: Secondary | ICD-10-CM | POA: Insufficient documentation

## 2015-10-10 DIAGNOSIS — Z9289 Personal history of other medical treatment: Secondary | ICD-10-CM | POA: Diagnosis not present

## 2015-10-10 LAB — POCT URINALYSIS DIPSTICK
Bilirubin, UA: NEGATIVE
GLUCOSE UA: NEGATIVE
Ketones, UA: NEGATIVE
LEUKOCYTES UA: NEGATIVE
NITRITE UA: NEGATIVE
Protein, UA: NEGATIVE
SPEC GRAV UA: 1.02
UROBILINOGEN UA: 1
pH, UA: 7

## 2015-10-10 MED ORDER — NAPROXEN 500 MG PO TABS: 500.0000 mg | ORAL_TABLET | Freq: Two times a day (BID) | ORAL | Status: DC

## 2015-10-10 NOTE — Patient Instructions (Signed)
If no improvement in side/hip pain in 2 weeks call me and we may need to refer you to Orthopedics

## 2015-10-10 NOTE — Progress Notes (Signed)
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

## 2015-10-10 NOTE — Progress Notes (Signed)
Patient ID: Edward Wilson, male   DOB: 12-27-1960, 55 y.o.   MRN: [id number]  CC: syphilis testing  HPI: Edward Wilson is a 55 y.o. male here today for a follow up visit.  Patient has past medical history of asthma and hypertension. Patient reports that 3 weeks ago he was told by [facility] that he tested positive for Syphilis. He has yet to go to the local health department for treatment. He would like retesting today because he was told it could be a lab error. No vision changes, skin rashes, or genital ulcers. He believes if he does have syphilis he may have contracted it years ago.   For past 3 weeks pain in his left hip and back area that feels like a pulling sensation. Left thigh numbness began around the same time. No injury. He has been taken Naproxen for 3 days but stopped 1 week ago. He now has started Tramadol that he has had at home.   Allergies  Allergen Reactions  . Shellfish Allergy     hives   Past Medical History  Diagnosis Date  . Asthma   . Hypertension    Current Outpatient Prescriptions on File Prior to Visit  Medication Sig Dispense Refill  . losartan (COZAAR) 100 MG tablet Take 1 tablet (100 mg total) by mouth daily. 90 tablet 3  . traMADol (ULTRAM) 50 MG tablet Take 1 tablet (50 mg total) by mouth every 12 (twelve) hours as needed. For pain 60 tablet 0  . albuterol (PROVENTIL HFA;VENTOLIN HFA) 108 (90 BASE) MCG/ACT inhaler Inhale 1-2 puffs into the lungs every 6 (six) hours as needed for wheezing or shortness of breath. 1 Inhaler 2  . fluticasone (FLOVENT DISKUS) 50 MCG/BLIST diskus inhaler Inhale 1 puff into the lungs 2 (two) times daily. 1 Inhaler 12   No current facility-administered medications on file prior to visit.   Family History  Problem Relation Age of Onset  . Heart failure Mother     heart attack  . Colon cancer Neg Hx    Social History   Social History  . Marital Status: Single    Spouse Name: N/A  . Number of Children: N/A  . Years of  Education: N/A   Occupational History  . Not on file.   Social History Main Topics  . Smoking status: Current Every Day Smoker -- 0.10 packs/day    Types: Cigarettes  . Smokeless tobacco: Never Used     Comment: Smoking 1-2 cigs 3 days per week  . Alcohol Use: 2.4 - 6.0 oz/week    4-10 Cans of beer per week  . Drug Use: No  . Sexual Activity: Not on file   Other Topics Concern  . Not on file   Social History Narrative    Review of Systems: Other than what is stated in HPI, all other systems are negative.   Objective:   Filed Vitals:   10/10/15 1530 10/10/15 1634  BP: 163/92 140/72  Pulse: 76   Temp: 98 F (36.7 C)   Resp: 16     Physical Exam  Constitutional: He is oriented to person, place, and time.  Cardiovascular: Normal rate, regular rhythm and normal heart sounds.   Pulmonary/Chest: Effort normal and breath sounds normal.  Abdominal: There is no tenderness.  No CVA tenderness  Musculoskeletal: He exhibits no tenderness.  Neurological: He is alert and oriented to person, place, and time.  Skin: No rash noted.     Lab Results  Component Value Date   WBC 8.2 11/27/2014   HGB 12.8* 11/27/2014   HCT 39.5 11/27/2014   MCV 89.0 11/27/2014   PLT 239 11/27/2014   Lab Results  Component Value Date   CREATININE 0.95 01/27/2012   BUN 18 01/27/2012   NA 138 01/27/2012   K 4.5 01/27/2012   CL 104 01/27/2012   CO2 25 01/27/2012    Lab Results  Component Value Date   HGBA1C 5.90 03/15/2015   Lipid Panel  No results found for: CHOL, TRIG, HDL, CHOLHDL, VLDL, LDLCALC     Assessment and plan:   [name] was seen today for flank pain.  Diagnoses and all orders for this visit:  History of RPR test -     RPR -     HIV antibody (with reflex) -     Basic Metabolic Panel Will retest today. Nursing staff called and scheduled patient a health department visit for next week. Stressed importance of making appointment and prompt treatment.   Left hip pain -      Urinalysis Dipstick -     Begin naproxen (NAPROSYN) 500 MG tablet; Take 1 tablet (500 mg total) by mouth 2 (two) times daily with a meal.  Return in about 2 weeks (around 10/24/2015) for Nurse Visit-BP check and 3 mo PCP HTN.\      Lance Bosch, [facility] [phone] 10/10/2015, 4:03 PM

## 2015-10-11 LAB — BASIC METABOLIC PANEL
BUN: 15 mg/dL (ref 7–25)
CHLORIDE: 102 mmol/L (ref 98–110)
CO2: 27 mmol/L (ref 20–31)
CREATININE: 1 mg/dL (ref 0.70–1.33)
Calcium: 9.9 mg/dL (ref 8.6–10.3)
GLUCOSE: 92 mg/dL (ref 65–99)
POTASSIUM: 5.1 mmol/L (ref 3.5–5.3)
Sodium: 140 mmol/L (ref 135–146)

## 2015-10-11 LAB — RPR: RPR Ser Ql: REACTIVE — AB

## 2015-10-11 LAB — HIV ANTIBODY (ROUTINE TESTING W REFLEX): HIV 1&2 Ab, 4th Generation: NONREACTIVE

## 2015-10-11 LAB — FLUORESCENT TREPONEMAL AB(FTA)-IGG-BLD: Fluorescent Treponemal ABS: REACTIVE — AB

## 2015-10-11 LAB — RPR TITER: RPR Titer: 1:8 {titer}

## 2015-10-14 DIAGNOSIS — Z8619 Personal history of other infectious and parasitic diseases: Secondary | ICD-10-CM | POA: Insufficient documentation

## 2015-10-15 ENCOUNTER — Telehealth: Payer: Self-pay

## 2015-10-15 NOTE — Telephone Encounter (Signed)
Spoke with patient this am and he is aware of his positive syphilis results Patient stated he had to cancel is appt at the health department this am due  To car problems  i explained how important it is to be treated and he agreed He will call the health dept back to day to reschedule his appointment

## 2015-10-15 NOTE — Telephone Encounter (Signed)
-----   Message from Lance Bosch, NP sent at 10/13/2015  5:09 PM EST ----- Patient is positive for Syphilis. Make sure he keeps his appointment with the Health Department for proper treatment.

## 2015-10-24 ENCOUNTER — Ambulatory Visit: Payer: BLUE CROSS/BLUE SHIELD | Attending: Internal Medicine | Admitting: Pharmacist

## 2015-10-24 VITALS — BP 160/91 | HR 84

## 2015-10-24 DIAGNOSIS — Z79899 Other long term (current) drug therapy: Secondary | ICD-10-CM | POA: Diagnosis not present

## 2015-10-24 DIAGNOSIS — I10 Essential (primary) hypertension: Secondary | ICD-10-CM

## 2015-10-24 MED ORDER — AMLODIPINE BESYLATE 5 MG PO TABS: 5.0000 mg | ORAL_TABLET | Freq: Every day | ORAL | Status: DC

## 2015-10-24 NOTE — Patient Instructions (Signed)
Thanks for coming to see me!  Continue losartan 100 mg daily  Start amlodipine 5 mg daily   Come back and see me for a blood pressure check in 2 weeks

## 2015-10-24 NOTE — Progress Notes (Signed)
S:    Patient arrives in good spirits. Presents to the clinic for hypertension evaluation.   Patient reports adherence with medications.  Current BP Medications include:  Losartan 100 mg daily  Antihypertensives tried in the past include: none  Dietary habits include: does not avoid salt and eats a lot of fast food (had two burgers from [employer] prior to visit today)  Patient reports that his pain is much better after starting the naproxen.   O:   Last 3 Office BP readings: BP Readings from Last 3 Encounters:  10/24/15 160/91  10/10/15 140/72  03/15/15 138/77    BMET    Component Value Date/Time   NA 140 10/10/2015 1625   K 5.1 10/10/2015 1625   CL 102 10/10/2015 1625   CO2 27 10/10/2015 1625   GLUCOSE 92 10/10/2015 1625   BUN 15 10/10/2015 1625   CREATININE 1.00 10/10/2015 1625   CREATININE 0.95 01/27/2012 0113   CALCIUM 9.9 10/10/2015 1625   GFRNONAA >90 01/27/2012 0113   GFRAA >90 01/27/2012 0113    A/P: History of hypertension currently UNcontrolled on current medications. Multiple elevated blood pressures in the past. Continue losartan 100 mg daily and initiate amlodipine 5 mg daily. Patient has A1c in the pre-diabetic range (A1c of 5.9) and HCTZ can cause hyperglycemia so will avoid use at this time. Reviewed how amlodipine worked and how to take it, as well as potential adverse effects. Also reviewed the importance of the DASH diet. Patient verbalized understanding.   Results reviewed and written information provided.   Total time in face-to-face counseling 20 minutes.   F/U Clinic Visit with me in 2 weeks for blood pressure check.

## 2015-10-28 ENCOUNTER — Telehealth: Payer: Self-pay

## 2015-10-28 ENCOUNTER — Telehealth: Payer: Self-pay | Admitting: Internal Medicine

## 2015-10-28 NOTE — Telephone Encounter (Signed)
Returned phone call to patient Patient was seen at the health department and treated for his syphilis Spoke with [facility] @ health department and she will call the patient to clarify His visit [phone]

## 2015-10-28 NOTE — Telephone Encounter (Signed)
Pt. Called requesting to speak to  PCP regarding his Hepatitis situation. Pt. Does not understand what he was suppose to do. Please f/u with pt.

## 2015-11-07 ENCOUNTER — Ambulatory Visit: Payer: BLUE CROSS/BLUE SHIELD | Admitting: Pharmacist

## 2015-12-13 ENCOUNTER — Encounter: Payer: Self-pay | Admitting: Internal Medicine

## 2015-12-13 ENCOUNTER — Ambulatory Visit (HOSPITAL_BASED_OUTPATIENT_CLINIC_OR_DEPARTMENT_OTHER): Payer: BLUE CROSS/BLUE SHIELD | Admitting: Internal Medicine

## 2015-12-13 ENCOUNTER — Ambulatory Visit: Payer: BLUE CROSS/BLUE SHIELD | Admitting: Pharmacist

## 2015-12-13 VITALS — BP 158/76 | HR 76 | Temp 98.0°F | Resp 16 | Ht 77.0 in | Wt 286.0 lb

## 2015-12-13 DIAGNOSIS — J4520 Mild intermittent asthma, uncomplicated: Secondary | ICD-10-CM | POA: Diagnosis not present

## 2015-12-13 DIAGNOSIS — F17200 Nicotine dependence, unspecified, uncomplicated: Secondary | ICD-10-CM | POA: Diagnosis not present

## 2015-12-13 DIAGNOSIS — I10 Essential (primary) hypertension: Secondary | ICD-10-CM

## 2015-12-13 DIAGNOSIS — L309 Dermatitis, unspecified: Secondary | ICD-10-CM

## 2015-12-13 MED ORDER — AMLODIPINE BESYLATE 10 MG PO TABS: 10.0000 mg | ORAL_TABLET | Freq: Every day | ORAL | Status: DC

## 2015-12-13 MED ORDER — CETIRIZINE HCL 10 MG PO TABS: 10.0000 mg | ORAL_TABLET | Freq: Every day | ORAL | Status: DC

## 2015-12-13 MED ORDER — ALBUTEROL SULFATE HFA 108 (90 BASE) MCG/ACT IN AERS
1.0000 | INHALATION_SPRAY | Freq: Four times a day (QID) | RESPIRATORY_TRACT | Status: DC | PRN
Start: 2015-12-13 — End: 2016-03-17

## 2015-12-13 MED ORDER — AMLODIPINE BESYLATE 5 MG PO TABS: 5.0000 mg | ORAL_TABLET | Freq: Every day | ORAL | Status: DC

## 2015-12-13 NOTE — Patient Instructions (Signed)
ph# [phone]-----[facility]

## 2015-12-13 NOTE — Progress Notes (Signed)
Patient here for follow up on his asthma HTN and requesting a referral  To dermatology

## 2015-12-13 NOTE — Progress Notes (Signed)
Patient ID: Edward Wilson, male   DOB: Oct 06, 1960, 55 y.o.   MRN: [id number] Subjective:  Edward Wilson is a 55 y.o. male with hypertension, asthma, eczema, and tobacco use. Patient reports that he was seen by the local health department and was told he was positive for syphilis and Hepatitis C but they both did not require treatment because his titers were so low.  He is concerned about his skin because he has continued to break out. He has been seen by a dermatologist but was not given any treatment. Eczema on bilateral wrist/ankles. Area is itchy and hyperpigmented. Patient states that he takes his blood pressure medication daily without complications. He denies chest pain, palpitations, edema.  He is requesting refills on his Albuterol. States that since his allergies have flared up he has noticed more problems with his asthma. He has not smoked in one week. He only has SOB with exertion, no nighttime awakenings. Current Outpatient Prescriptions  Medication Sig Dispense Refill  . albuterol (PROVENTIL HFA;VENTOLIN HFA) 108 (90 BASE) MCG/ACT inhaler Inhale 1-2 puffs into the lungs every 6 (six) hours as needed for wheezing or shortness of breath. 1 Inhaler 2  . amLODipine (NORVASC) 5 MG tablet Take 1 tablet (5 mg total) by mouth daily. 30 tablet 2  . losartan (COZAAR) 100 MG tablet Take 1 tablet (100 mg total) by mouth daily. 90 tablet 3  . fluticasone (FLOVENT DISKUS) 50 MCG/BLIST diskus inhaler Inhale 1 puff into the lungs 2 (two) times daily. (Patient not taking: Reported on 12/13/2015) 1 Inhaler 12  . naproxen (NAPROSYN) 500 MG tablet Take 1 tablet (500 mg total) by mouth 2 (two) times daily with a meal. 30 tablet 0   No current facility-administered medications for this visit.    ROS: taking medications as instructed, no medication side effects noted, no TIA's, no chest pain on exertion, no dyspnea on exertion, no swelling of ankles and no palpitations. All other systems negative    Objective:   BP 174/75 mmHg  Pulse 73  Temp(Src) 98 F (36.7 C)  Resp 16  Ht 6\' 5"  (1.956 m)  Wt 286 lb (129.729 kg)  BMI 33.91 kg/m2  SpO2 100%  Appearance alert, well appearing, and in no distress, oriented to person, place, and time and overweight. General exam BP noted to be well controlled today in office, S1, S2 normal, no gallop, no murmur, chest clear, no JVD, no HSM, no edema.  Lab review: labs are reviewed, up to date and normal.   Assessment:   Edward Wilson was seen today for follow-up.  Diagnoses and all orders for this visit:  Essential hypertension, benign -     amLODipine (NORVASC) 10 MG tablet; Take 1 tablet (10 mg total) by mouth daily. I have increased his Amlodipine to 10 mg for better control. He will also continue Losartan.   Asthma, mild intermittent, uncomplicated -     albuterol (PROVENTIL HFA;VENTOLIN HFA) 108 (90 Base) MCG/ACT inhaler; Inhale 1-2 puffs into the lungs every 6 (six) hours as needed for wheezing or shortness of breath. Stable, meds refilled  Eczema -     cetirizine (ZYRTEC) 10 MG tablet; Take 1 tablet (10 mg total) by mouth daily.  Tobacco use disorder I have congratulated patient on his efforts of smoking cessation. We have discussed ways to stay successful and potential harms of smoking.    Plan:  Reviewed diet, exercise and weight control. Recommended sodium restriction. Copy of written low fat low cholesterol diet provided and reviewed.Marland Kitchen  Return in about 2 weeks (around 12/27/2015) for Nurse Visit-BP check and 3 mo PCP HTN.'  Lance Bosch, NP 12/13/2015 4:15 PM

## 2015-12-30 ENCOUNTER — Encounter: Payer: BLUE CROSS/BLUE SHIELD | Admitting: Pharmacist

## 2016-02-12 ENCOUNTER — Telehealth: Payer: Self-pay | Admitting: Internal Medicine

## 2016-02-12 NOTE — Telephone Encounter (Signed)
Pt. Called requesting a refill on albuterol (PROVENTIL HFA;VENTOLIN HFA) 108 (90 Base) MCG/ACT inhaler. Please f/u with pt.

## 2016-03-06 ENCOUNTER — Other Ambulatory Visit: Payer: Self-pay | Admitting: Internal Medicine

## 2016-03-17 ENCOUNTER — Other Ambulatory Visit: Payer: Self-pay | Admitting: *Deleted

## 2016-03-17 DIAGNOSIS — J4520 Mild intermittent asthma, uncomplicated: Secondary | ICD-10-CM

## 2016-03-17 MED ORDER — ALBUTEROL SULFATE HFA 108 (90 BASE) MCG/ACT IN AERS: 1.0000 | INHALATION_SPRAY | Freq: Four times a day (QID) | RESPIRATORY_TRACT | Status: DC | PRN

## 2016-03-17 NOTE — Telephone Encounter (Signed)
Patient was last seen in the office on 12/13/15 and received inhaler refill with 2 additional refills. MA refilled patients inhaler with 2 additional refills as well.

## 2016-03-31 ENCOUNTER — Ambulatory Visit: Payer: BLUE CROSS/BLUE SHIELD | Admitting: Internal Medicine

## 2016-04-21 ENCOUNTER — Ambulatory Visit: Payer: BLUE CROSS/BLUE SHIELD | Admitting: Family Medicine

## 2016-06-10 ENCOUNTER — Other Ambulatory Visit: Payer: Self-pay | Admitting: Internal Medicine

## 2016-07-14 ENCOUNTER — Other Ambulatory Visit: Payer: Self-pay | Admitting: Internal Medicine

## 2016-07-14 DIAGNOSIS — I10 Essential (primary) hypertension: Secondary | ICD-10-CM

## 2016-07-20 ENCOUNTER — Other Ambulatory Visit: Payer: Self-pay | Admitting: Internal Medicine

## 2016-07-20 DIAGNOSIS — I10 Essential (primary) hypertension: Secondary | ICD-10-CM

## 2016-07-27 ENCOUNTER — Telehealth: Payer: Self-pay | Admitting: Internal Medicine

## 2016-07-27 DIAGNOSIS — I10 Essential (primary) hypertension: Secondary | ICD-10-CM

## 2016-07-27 MED ORDER — AMLODIPINE BESYLATE 10 MG PO TABS: 10.0000 mg | ORAL_TABLET | Freq: Every day | ORAL | 0 refills | Status: DC

## 2016-07-27 MED ORDER — LOSARTAN POTASSIUM 100 MG PO TABS: 100.0000 mg | ORAL_TABLET | Freq: Every day | ORAL | 0 refills | Status: DC

## 2016-07-27 NOTE — Telephone Encounter (Signed)
Amlodipine and losartan refilled to last until appt.

## 2016-07-27 NOTE — Telephone Encounter (Signed)
Pt. Called requesting a refill on the following medications:  amLODipine (NORVASC) 10 MG tablet   losartan (COZAAR) 100 MG tablet   Pt. Has scheduled an appointment but has no medication. Pt. Would like the medication sent to [facility].  Please f/u

## 2016-08-07 ENCOUNTER — Encounter: Payer: Self-pay | Admitting: Family Medicine

## 2016-08-07 ENCOUNTER — Ambulatory Visit: Payer: BLUE CROSS/BLUE SHIELD | Attending: Family Medicine | Admitting: Family Medicine

## 2016-08-07 VITALS — BP 157/74 | HR 72 | Temp 98.3°F | Ht 77.0 in | Wt 286.8 lb

## 2016-08-07 DIAGNOSIS — Z6834 Body mass index (BMI) 34.0-34.9, adult: Secondary | ICD-10-CM | POA: Insufficient documentation

## 2016-08-07 DIAGNOSIS — F17200 Nicotine dependence, unspecified, uncomplicated: Secondary | ICD-10-CM

## 2016-08-07 DIAGNOSIS — R079 Chest pain, unspecified: Secondary | ICD-10-CM | POA: Diagnosis not present

## 2016-08-07 DIAGNOSIS — Z7289 Other problems related to lifestyle: Secondary | ICD-10-CM

## 2016-08-07 DIAGNOSIS — J45909 Unspecified asthma, uncomplicated: Secondary | ICD-10-CM | POA: Insufficient documentation

## 2016-08-07 DIAGNOSIS — Z789 Other specified health status: Secondary | ICD-10-CM | POA: Diagnosis not present

## 2016-08-07 DIAGNOSIS — J4540 Moderate persistent asthma, uncomplicated: Secondary | ICD-10-CM

## 2016-08-07 DIAGNOSIS — E669 Obesity, unspecified: Secondary | ICD-10-CM | POA: Diagnosis not present

## 2016-08-07 DIAGNOSIS — I10 Essential (primary) hypertension: Secondary | ICD-10-CM | POA: Insufficient documentation

## 2016-08-07 DIAGNOSIS — F17210 Nicotine dependence, cigarettes, uncomplicated: Secondary | ICD-10-CM | POA: Diagnosis not present

## 2016-08-07 MED ORDER — HYDROCHLOROTHIAZIDE 12.5 MG PO TABS: 12.5000 mg | ORAL_TABLET | Freq: Every day | ORAL | 5 refills | Status: DC

## 2016-08-07 MED ORDER — ALBUTEROL SULFATE HFA 108 (90 BASE) MCG/ACT IN AERS: 1.0000 | INHALATION_SPRAY | Freq: Four times a day (QID) | RESPIRATORY_TRACT | 2 refills | Status: DC | PRN

## 2016-08-07 MED ORDER — LOSARTAN POTASSIUM 100 MG PO TABS: 100.0000 mg | ORAL_TABLET | Freq: Every day | ORAL | 5 refills | Status: DC

## 2016-08-07 MED ORDER — AMLODIPINE BESYLATE 10 MG PO TABS: 10.0000 mg | ORAL_TABLET | Freq: Every day | ORAL | 5 refills | Status: DC

## 2016-08-07 MED FILL — LOSARTAN POTASSIUM 100 MG T: 100 | 30 days supply | Qty: 30 | Fill #0

## 2016-08-07 MED FILL — AMLODIPINE BESYLATE 10 MG T: 10 | 30 days supply | Qty: 30 | Fill #0

## 2016-08-07 MED FILL — VENTOLIN HFA 90 MCG INHALER: 108 (90 BAS | 20 days supply | Qty: 18 | Fill #0

## 2016-08-07 MED FILL — ?HYDROCHLOROTHIAZIDE 12.5 M: 12.5 | 30 days supply | Qty: 30 | Fill #0

## 2016-08-07 NOTE — Progress Notes (Signed)
Pt is here today for HTN.

## 2016-08-07 NOTE — Assessment & Plan Note (Addendum)
A: HTN above goal Med: compliant P: Continue losartan and norvasc Add HCTZ 12.5 mg daily Smoking cessation Reduce alcohol intake  Increase exercise

## 2016-08-07 NOTE — Patient Instructions (Addendum)
Edward Wilson was seen today for hypertension.  Diagnoses and all orders for this visit:  Essential hypertension, benign -     Discontinue: amLODipine (NORVASC) 10 MG tablet; Take 1 tablet (10 mg total) by mouth daily. -     Discontinue: losartan (COZAAR) 100 MG tablet; Take 1 tablet (100 mg total) by mouth daily. -     Discontinue: hydrochlorothiazide (HYDRODIURIL) 12.5 MG tablet; Take 1 tablet (12.5 mg total) by mouth daily. -     amLODipine (NORVASC) 10 MG tablet; Take 1 tablet (10 mg total) by mouth daily. -     hydrochlorothiazide (HYDRODIURIL) 12.5 MG tablet; Take 1 tablet (12.5 mg total) by mouth daily. -     losartan (COZAAR) 100 MG tablet; Take 1 tablet (100 mg total) by mouth daily.  Moderate persistent chronic asthma without complication -     Discontinue: albuterol (PROVENTIL HFA;VENTOLIN HFA) 108 (90 Base) MCG/ACT inhaler; Inhale 1-2 puffs into the lungs every 6 (six) hours as needed for wheezing or shortness of breath. -     albuterol (PROVENTIL HFA;VENTOLIN HFA) 108 (90 Base) MCG/ACT inhaler; Inhale 1-2 puffs into the lungs every 6 (six) hours as needed for wheezing or shortness of breath.  start exercising and work to stop smoking to reduce pressure naturally  F/u in 4 weeks for BP check  Dr. Adrian Blackwater

## 2016-08-07 NOTE — Assessment & Plan Note (Signed)
Cessation addressed  

## 2016-08-07 NOTE — Progress Notes (Signed)
Subjective:  Patient ID: Edward Wilson, male    DOB: Apr 06, 1961  Age: 55 y.o. MRN: [id number]  CC: Hypertension   HPI Edward Wilson presents for   1. CHRONIC HYPERTENSION  Disease Monitoring  Blood pressure range: not checking   Chest pain: yes, intermittent sharp pains substernal, usually occurs are rest    Dyspnea:  A little bit here and there   Claudication: no   Medication compliance: yes  Medication Side Effects  Lightheadedness: no   Urinary frequency: no   Edema: no   Impotence: no   Preventitive Healthcare:  Exercise: no   Diet Pattern: eats 2-3 meals a day   Salt Restriction: eat some, but does not add extra salt   2. Alcohol use: reports drinking 2-3, 40 oz per week. Denies drinking to intoxication.   3. Asthma: had asthma as a child. Is a smoker. No using flovent. Using albuterol sparingly. Denies daily or even weekly use. Request refill but reports that albuterol was expensive at [facility].   Social History  Substance Use Topics  . Smoking status: Current Every Day Smoker    Packs/day: 0.10    Types: Cigarettes  . Smokeless tobacco: Never Used     Comment: Smoking 1-2 cigs 3 days per week  . Alcohol use 2.4 - 6.0 oz/week    4 - 10 Cans of beer per week   Outpatient Medications Prior to Visit  Medication Sig Dispense Refill  . albuterol (PROVENTIL HFA;VENTOLIN HFA) 108 (90 Base) MCG/ACT inhaler Inhale 1-2 puffs into the lungs every 6 (six) hours as needed for wheezing or shortness of breath. 1 Inhaler 2  . amLODipine (NORVASC) 10 MG tablet Take 1 tablet (10 mg total) by mouth daily. 30 tablet 0  . cetirizine (ZYRTEC) 10 MG tablet Take 1 tablet (10 mg total) by mouth daily. 30 tablet 11  . losartan (COZAAR) 100 MG tablet Take 1 tablet (100 mg total) by mouth daily. 30 tablet 0  . fluticasone (FLOVENT DISKUS) 50 MCG/BLIST diskus inhaler Inhale 1 puff into the lungs 2 (two) times daily. (Patient not taking: Reported on 08/07/2016) 1 Inhaler 12  . naproxen  (NAPROSYN) 500 MG tablet Take 1 tablet (500 mg total) by mouth 2 (two) times daily with a meal. (Patient not taking: Reported on 08/07/2016) 30 tablet 0   No facility-administered medications prior to visit.     ROS Review of Systems  Constitutional: Negative for chills, fatigue, fever and unexpected weight change.  Eyes: Negative for visual disturbance.  Respiratory: Negative for cough and shortness of breath.   Cardiovascular: Negative for chest pain, palpitations and leg swelling.  Gastrointestinal: Negative for abdominal pain, blood in stool, constipation, diarrhea, nausea and vomiting.  Endocrine: Negative for polydipsia, polyphagia and polyuria.  Musculoskeletal: Negative for arthralgias, back pain, gait problem, myalgias and neck pain.  Skin: Negative for rash.  Allergic/Immunologic: Negative for immunocompromised state.  Hematological: Negative for adenopathy. Does not bruise/bleed easily.  Psychiatric/Behavioral: Negative for dysphoric mood, sleep disturbance and suicidal ideas. The patient is not nervous/anxious.     Objective:  BP (!) 157/74 (BP Location: Left Arm, Patient Position: Sitting, Cuff Size: Large)   Pulse 72   Temp 98.3 F (36.8 C) (Oral)   Ht 6\' 5"  (1.956 m)   Wt 286 lb 12.8 oz (130.1 kg)   SpO2 96%   BMI 34.01 kg/m   BP/Weight 08/07/2016 12/13/2015 [id number]  Systolic BP [id number]  Diastolic BP 74 76 91  Wt. (Lbs)  286.8 286 -  BMI 34.01 33.91 -    Physical Exam  Constitutional: He appears well-developed and well-nourished. No distress.  Obese   HENT:  Head: Normocephalic and atraumatic.  Eyes: Conjunctivae are normal. Pupils are equal, round, and reactive to light.  Neck: Normal range of motion. Neck supple.  Cardiovascular: Normal rate, regular rhythm, normal heart sounds and intact distal pulses.   Pulmonary/Chest: Effort normal and breath sounds normal.  Musculoskeletal: He exhibits no edema.  Neurological: He is alert.  Skin: Skin is warm  and dry. No rash noted. No erythema.  Psychiatric: He has a normal mood and affect.   Assessment & Plan:  [name] was seen today for hypertension.  Diagnoses and all orders for this visit:  Essential hypertension, benign -     Discontinue: amLODipine (NORVASC) 10 MG tablet; Take 1 tablet (10 mg total) by mouth daily. -     Discontinue: losartan (COZAAR) 100 MG tablet; Take 1 tablet (100 mg total) by mouth daily. -     Discontinue: hydrochlorothiazide (HYDRODIURIL) 12.5 MG tablet; Take 1 tablet (12.5 mg total) by mouth daily. -     amLODipine (NORVASC) 10 MG tablet; Take 1 tablet (10 mg total) by mouth daily. -     hydrochlorothiazide (HYDRODIURIL) 12.5 MG tablet; Take 1 tablet (12.5 mg total) by mouth daily. -     losartan (COZAAR) 100 MG tablet; Take 1 tablet (100 mg total) by mouth daily.  Moderate persistent chronic asthma without complication -     Discontinue: albuterol (PROVENTIL HFA;VENTOLIN HFA) 108 (90 Base) MCG/ACT inhaler; Inhale 1-2 puffs into the lungs every 6 (six) hours as needed for wheezing or shortness of breath. -     albuterol (PROVENTIL HFA;VENTOLIN HFA) 108 (90 Base) MCG/ACT inhaler; Inhale 1-2 puffs into the lungs every 6 (six) hours as needed for wheezing or shortness of breath.   There are no diagnoses linked to this encounter.  No orders of the defined types were placed in this encounter.   Follow-up: Return in about 4 weeks (around 09/04/2016) for HTN .   Boykin Nearing MD

## 2016-08-07 NOTE — Assessment & Plan Note (Signed)
Chronic asthma Refilled prn albuterol Smoking cessation

## 2016-08-07 NOTE — Assessment & Plan Note (Signed)
Alcohol use addressed Reduce to < 2, 40 oz per day

## 2016-09-08 MED FILL — ?HYDROCHLOROTHIAZIDE 12.5 M: 12.5 | 30 days supply | Qty: 30 | Fill #1

## 2016-09-08 MED FILL — LOSARTAN POTASSIUM 100 MG T: 100 | 30 days supply | Qty: 30 | Fill #1

## 2016-09-08 MED FILL — VENTOLIN HFA 90 MCG INHALER: 108 (90 BAS | 20 days supply | Qty: 18 | Fill #1

## 2016-09-08 MED FILL — AMLODIPINE BESYLATE 10 MG T: 10 | 30 days supply | Qty: 30 | Fill #1

## 2016-09-17 ENCOUNTER — Encounter: Payer: Self-pay | Admitting: Family Medicine

## 2016-09-17 ENCOUNTER — Ambulatory Visit: Payer: BLUE CROSS/BLUE SHIELD | Attending: Family Medicine | Admitting: Family Medicine

## 2016-09-17 VITALS — BP 150/76 | HR 76 | Temp 97.9°F | Ht 77.0 in | Wt 291.0 lb

## 2016-09-17 DIAGNOSIS — R079 Chest pain, unspecified: Secondary | ICD-10-CM | POA: Diagnosis not present

## 2016-09-17 DIAGNOSIS — Z23 Encounter for immunization: Secondary | ICD-10-CM | POA: Diagnosis not present

## 2016-09-17 DIAGNOSIS — R21 Rash and other nonspecific skin eruption: Secondary | ICD-10-CM | POA: Diagnosis not present

## 2016-09-17 DIAGNOSIS — F17210 Nicotine dependence, cigarettes, uncomplicated: Secondary | ICD-10-CM | POA: Diagnosis not present

## 2016-09-17 DIAGNOSIS — Z79899 Other long term (current) drug therapy: Secondary | ICD-10-CM | POA: Diagnosis not present

## 2016-09-17 DIAGNOSIS — Z1159 Encounter for screening for other viral diseases: Secondary | ICD-10-CM | POA: Diagnosis not present

## 2016-09-17 DIAGNOSIS — K921 Melena: Secondary | ICD-10-CM

## 2016-09-17 DIAGNOSIS — Z789 Other specified health status: Secondary | ICD-10-CM

## 2016-09-17 DIAGNOSIS — E7800 Pure hypercholesterolemia, unspecified: Secondary | ICD-10-CM | POA: Insufficient documentation

## 2016-09-17 DIAGNOSIS — Z7289 Other problems related to lifestyle: Secondary | ICD-10-CM

## 2016-09-17 DIAGNOSIS — I10 Essential (primary) hypertension: Secondary | ICD-10-CM | POA: Diagnosis not present

## 2016-09-17 LAB — HEMOCCULT GUIAC POC 1CARD (OFFICE): FECAL OCCULT BLD: NEGATIVE

## 2016-09-17 LAB — CBC
HEMATOCRIT: 38.1 % — AB (ref 38.5–50.0)
HEMOGLOBIN: 12.3 g/dL — AB (ref 13.2–17.1)
MCH: 28.7 pg (ref 27.0–33.0)
MCHC: 32.3 g/dL (ref 32.0–36.0)
MCV: 88.8 fL (ref 80.0–100.0)
MPV: 11.1 fL (ref 7.5–12.5)
Platelets: 274 10*3/uL (ref 140–400)
RBC: 4.29 MIL/uL (ref 4.20–5.80)
RDW: 13.5 % (ref 11.0–15.0)
WBC: 8 10*3/uL (ref 3.8–10.8)

## 2016-09-17 MED ORDER — HYDROCHLOROTHIAZIDE 25 MG PO TABS: 25.0000 mg | ORAL_TABLET | Freq: Every day | ORAL | 2 refills | Status: DC

## 2016-09-17 MED ORDER — TRIAMCINOLONE ACETONIDE 0.1 % EX CREA: 1.0000 "application " | TOPICAL_CREAM | Freq: Two times a day (BID) | CUTANEOUS | 0 refills | Status: DC

## 2016-09-17 NOTE — Patient Instructions (Addendum)
[place] was seen today for hypertension.  Diagnoses and all orders for this visit:  Essential hypertension, benign -     hydrochlorothiazide (HYDRODIURIL) 25 MG tablet; Take 1 tablet (25 mg total) by mouth daily. -     Lipid Panel -     CBC -     COMPLETE METABOLIC PANEL WITH GFR  Skin rash -     triamcinolone cream (KENALOG) 0.1 %; Apply 1 application topically 2 (two) times daily.  Blood in stool -     Hemoccult - 1 Card (office)   If rash worsens, stop HCTZ and call. I will replace HCTZ with coreg  F/u in 4 weeks for HTN  Dr. Adrian Blackwater

## 2016-09-17 NOTE — Progress Notes (Signed)
Subjective:  Patient ID: Edward Wilson, male    DOB: Apr 26, 1961  Age: 55 y.o. MRN: [id number]  CC: Hypertension   HPI Edward Wilson presents for   1. CHRONIC HYPERTENSION  Disease Monitoring  Blood pressure range: not checking   Chest pain: yes, intermittent sharp pains substernal, usually occurs are rest    Dyspnea:  A little bit here and there   Claudication: no   Medication compliance: yes, developed L upper chest rash after starting HCTZ, slightly pruritic  Medication Side Effects  Lightheadedness: no   Urinary frequency: no   Edema: no   Impotence: no   Preventitive Healthcare:  Exercise: no   Diet Pattern: eats 2-3 meals a day   Salt Restriction: eat some, but does not add extra salt   2. Alcohol use: reports drinking 2-3, 40 oz per week. Denies drinking to intoxication.    Social History  Substance Use Topics  . Smoking status: Current Every Day Smoker    Packs/day: 0.10    Types: Cigarettes  . Smokeless tobacco: Never Used     Comment: Smoking 1-2 cigs 3 days per week  . Alcohol use 2.4 - 6.0 oz/week    4 - 10 Cans of beer per week   Outpatient Medications Prior to Visit  Medication Sig Dispense Refill  . albuterol (PROVENTIL HFA;VENTOLIN HFA) 108 (90 Base) MCG/ACT inhaler Inhale 1-2 puffs into the lungs every 6 (six) hours as needed for wheezing or shortness of breath. 1 Inhaler 2  . amLODipine (NORVASC) 10 MG tablet Take 1 tablet (10 mg total) by mouth daily. 30 tablet 5  . cetirizine (ZYRTEC) 10 MG tablet Take 1 tablet (10 mg total) by mouth daily. 30 tablet 11  . hydrochlorothiazide (HYDRODIURIL) 12.5 MG tablet Take 1 tablet (12.5 mg total) by mouth daily. 30 tablet 5  . losartan (COZAAR) 100 MG tablet Take 1 tablet (100 mg total) by mouth daily. 30 tablet 5   No facility-administered medications prior to visit.     ROS Review of Systems  Constitutional: Negative for chills, fatigue, fever and unexpected weight change.  Eyes: Negative for visual  disturbance.  Respiratory: Positive for shortness of breath. Negative for cough.   Cardiovascular: Positive for chest pain. Negative for palpitations and leg swelling.  Gastrointestinal: Positive for blood in stool. Negative for abdominal pain, constipation, diarrhea, nausea and vomiting.  Endocrine: Negative for polydipsia, polyphagia and polyuria.  Musculoskeletal: Negative for arthralgias, back pain, gait problem, myalgias and neck pain.  Skin: Positive for rash.  Allergic/Immunologic: Negative for immunocompromised state.  Hematological: Negative for adenopathy. Does not bruise/bleed easily.  Psychiatric/Behavioral: Negative for dysphoric mood, sleep disturbance and suicidal ideas. The patient is not nervous/anxious.     Objective:  BP (!) 150/76 (BP Location: Left Arm, Patient Position: Sitting, Cuff Size: Large)   Pulse 76   Temp 97.9 F (36.6 C) (Oral)   Ht 6\' 5"  (1.956 m)   Wt 291 lb (132 kg)   SpO2 94%   BMI 34.51 kg/m   BP/Weight 09/17/2016 08/07/2016 [id number]  Systolic BP [id number]  Diastolic BP 76 74 76  Wt. (Lbs) 291 286.8 286  BMI 34.51 34.01 33.91    Physical Exam  Constitutional: He appears well-developed and well-nourished. No distress.  Obese   HENT:  Head: Normocephalic and atraumatic.  Eyes: Conjunctivae are normal. Pupils are equal, round, and reactive to light.  Neck: Normal range of motion. Neck supple.  Cardiovascular: Normal rate, regular rhythm, normal heart  sounds and intact distal pulses.   Pulmonary/Chest: Effort normal and breath sounds normal.  Genitourinary: Rectum normal. Rectal exam shows guaiac negative stool.  Musculoskeletal: He exhibits no edema.  Neurological: He is alert.  Skin: Skin is warm and dry. No rash noted. No erythema.  Psychiatric: He has a normal mood and affect.   Assessment & Plan:  Edward Wilson was seen today for hypertension.  Diagnoses and all orders for this visit:  Essential hypertension, benign -      hydrochlorothiazide (HYDRODIURIL) 25 MG tablet; Take 1 tablet (25 mg total) by mouth daily. -     Lipid Panel -     CBC -     COMPLETE METABOLIC PANEL WITH GFR  Skin rash -     triamcinolone cream (KENALOG) 0.1 %; Apply 1 application topically 2 (two) times daily.  Blood in stool -     Hemoccult - 1 Card (office)  Need for hepatitis C screening test -     Hepatitis C antibody, reflex  Encounter for immunization -     hydrochlorothiazide (HYDRODIURIL) 25 MG tablet; Take 1 tablet (25 mg total) by mouth daily. -     triamcinolone cream (KENALOG) 0.1 %; Apply 1 application topically 2 (two) times daily. -     Lipid Panel -     CBC -     Hemoccult - 1 Card (office) -     COMPLETE METABOLIC PANEL WITH GFR -     Hepatitis C antibody, reflex -     Flu Vaccine QUAD 36+ mos IM -     Tdap vaccine greater than or equal to 7yo IM -     Hepatitis C antibody  Pure hypercholesterolemia -     aspirin EC 81 MG tablet; Take 1 tablet (81 mg total) by mouth daily. -     atorvastatin (LIPITOR) 40 MG tablet; Take 1 tablet (40 mg total) by mouth daily.   There are no diagnoses linked to this encounter.  No orders of the defined types were placed in this encounter.   Follow-up: Return in about 4 weeks (around 10/15/2016) for HTN and chest pains .   Boykin Nearing MD

## 2016-09-18 ENCOUNTER — Telehealth: Payer: Self-pay

## 2016-09-18 DIAGNOSIS — E7800 Pure hypercholesterolemia, unspecified: Secondary | ICD-10-CM | POA: Insufficient documentation

## 2016-09-18 DIAGNOSIS — R21 Rash and other nonspecific skin eruption: Secondary | ICD-10-CM | POA: Insufficient documentation

## 2016-09-18 DIAGNOSIS — K921 Melena: Secondary | ICD-10-CM | POA: Insufficient documentation

## 2016-09-18 LAB — LIPID PANEL
Cholesterol: 206 mg/dL — ABNORMAL HIGH (ref <200)
HDL: 77 mg/dL (ref >40)
LDL Cholesterol: 103 mg/dL — ABNORMAL HIGH (ref <100)
TRIGLYCERIDES: 128 mg/dL (ref <150)
Total CHOL/HDL Ratio: 2.7 Ratio (ref <5.0)
VLDL: 26 mg/dL (ref <30)

## 2016-09-18 LAB — COMPLETE METABOLIC PANEL WITH GFR
ALT: 16 U/L (ref 9–46)
AST: 15 U/L (ref 10–35)
Albumin: 4.2 g/dL (ref 3.6–5.1)
Alkaline Phosphatase: 68 U/L (ref 40–115)
BUN: 11 mg/dL (ref 7–25)
CHLORIDE: 103 mmol/L (ref 98–110)
CO2: 29 mmol/L (ref 20–31)
CREATININE: 0.92 mg/dL (ref 0.70–1.33)
Calcium: 9.5 mg/dL (ref 8.6–10.3)
GFR, Est African American: >89 mL/min (ref >=60)
GFR, Est Non African American: >89 mL/min (ref >=60)
GLUCOSE: 86 mg/dL (ref 65–99)
POTASSIUM: 4.2 mmol/L (ref 3.5–5.3)
SODIUM: 140 mmol/L (ref 135–146)
Total Bilirubin: 0.5 mg/dL (ref 0.2–1.2)
Total Protein: 7.4 g/dL (ref 6.1–8.1)

## 2016-09-18 LAB — HEPATITIS C ANTIBODY: HCV Ab: NEGATIVE

## 2016-09-18 MED ORDER — ASPIRIN EC 81 MG PO TBEC: 81.0000 mg | DELAYED_RELEASE_TABLET | Freq: Every day | ORAL | 11 refills | Status: DC

## 2016-09-18 MED ORDER — ATORVASTATIN CALCIUM 40 MG PO TABS: 40.0000 mg | ORAL_TABLET | Freq: Every day | ORAL | 11 refills | Status: DC

## 2016-09-18 MED FILL — TRIAMCINOLONE 0.1% CREAM: 0.1 | 15 days supply | Qty: 30 | Fill #0

## 2016-09-18 MED FILL — ATORVASTATIN 40 MG TABLET: 40 | 30 days supply | Qty: 30 | Fill #0

## 2016-09-18 NOTE — Telephone Encounter (Signed)
Pt was called and informed of lab results, and medication being sent to his pharmacy.

## 2016-09-20 DIAGNOSIS — R079 Chest pain, unspecified: Secondary | ICD-10-CM | POA: Insufficient documentation

## 2016-09-20 MED ORDER — OMEPRAZOLE 40 MG PO CPDR: 40.0000 mg | DELAYED_RELEASE_CAPSULE | Freq: Every day | ORAL | 3 refills | Status: DC

## 2016-09-20 NOTE — Assessment & Plan Note (Addendum)
Chest pain in smoker with HTN and HLD Atypical pattern suspect GERD or chronic bronchitis R/o CAD  Plan: EKG at f/u Cardiology referral for stress test  Add aspirin and statin CXR PPI

## 2016-09-20 NOTE — Assessment & Plan Note (Signed)
Heme negative stool 

## 2016-09-20 NOTE — Assessment & Plan Note (Signed)
Tinea vs eczematous rash Plan: Topical steroid

## 2016-09-20 NOTE — Assessment & Plan Note (Signed)
Regular ETOH above recommended volume Advised decrease in ETOH

## 2016-09-20 NOTE — Assessment & Plan Note (Signed)
BP still elevated Med: compliant P: Increase HCTZ to 25 mg daily

## 2016-09-20 NOTE — Assessment & Plan Note (Signed)
HLD in smoker with HTN Add aspirin Add statin Smoking cessation addressed

## 2016-09-21 MED FILL — OMEPRAZOLE DR 40 MG CAPSULE: 40 | 30 days supply | Qty: 30 | Fill #0

## 2016-10-01 ENCOUNTER — Telehealth: Payer: Self-pay | Admitting: Cardiology

## 2016-10-01 NOTE — Telephone Encounter (Signed)
Close encounter 

## 2016-10-06 ENCOUNTER — Ambulatory Visit: Payer: BLUE CROSS/BLUE SHIELD | Admitting: Cardiology

## 2016-10-08 ENCOUNTER — Ambulatory Visit: Payer: BLUE CROSS/BLUE SHIELD | Admitting: Cardiology

## 2016-10-15 ENCOUNTER — Encounter: Payer: Self-pay | Admitting: Cardiology

## 2016-10-15 ENCOUNTER — Encounter (INDEPENDENT_AMBULATORY_CARE_PROVIDER_SITE_OTHER): Payer: Self-pay

## 2016-10-15 ENCOUNTER — Ambulatory Visit (INDEPENDENT_AMBULATORY_CARE_PROVIDER_SITE_OTHER): Payer: BLUE CROSS/BLUE SHIELD | Admitting: Cardiology

## 2016-10-15 VITALS — BP 132/74 | HR 68 | Ht 77.0 in | Wt 285.0 lb

## 2016-10-15 DIAGNOSIS — I10 Essential (primary) hypertension: Secondary | ICD-10-CM | POA: Diagnosis not present

## 2016-10-15 DIAGNOSIS — Z8249 Family history of ischemic heart disease and other diseases of the circulatory system: Secondary | ICD-10-CM

## 2016-10-15 DIAGNOSIS — R079 Chest pain, unspecified: Secondary | ICD-10-CM

## 2016-10-15 DIAGNOSIS — E782 Mixed hyperlipidemia: Secondary | ICD-10-CM | POA: Diagnosis not present

## 2016-10-15 MED FILL — AMLODIPINE BESYLATE 10 MG T: 10 | 30 days supply | Qty: 30 | Fill #2

## 2016-10-15 MED FILL — LOSARTAN POTASSIUM 100 MG T: 100 | 30 days supply | Qty: 30 | Fill #2

## 2016-10-15 MED FILL — VENTOLIN HFA 90 MCG INHALER: 108 (90 BAS | 20 days supply | Qty: 18 | Fill #2

## 2016-10-15 MED FILL — HYDROCHLOROTHIAZIDE 25 MG T: 25 | 30 days supply | Qty: 30 | Fill #0

## 2016-10-15 NOTE — Progress Notes (Signed)
Cardiology Office Note NEW PATIENT VISIT:  Date:  10/15/2016   ID:  Edward Wilson, DOB 05-21-1961, MRN [id number]  PCP:  Minerva Ends, MD  Cardiologist:  New Dr. Johnsie Cancel     Chief Complaint  Patient presents with  . Chest Pain      History of Present Illness: Edward Wilson is a 56 y.o. male who presents for chest pain.    He has a hx of HTN. Asthma, and HLD.   Today he states he had some brief episodes of chest pain, sharp fleeting. None recently.  He does have episodes of asthma and uses inhaler prn.  He has not been exercising but would like to begin.  BP is controlled today.  He also has premature CAD with mother who died at 9 with heart attack and heart failure.  Past Medical History:  Diagnosis Date  . Asthma   . Hypertension     No past surgical history on file. no past surgical hx.    Current Outpatient Prescriptions  Medication Sig Dispense Refill  . albuterol (PROVENTIL HFA;VENTOLIN HFA) 108 (90 Base) MCG/ACT inhaler Inhale 1-2 puffs into the lungs every 6 (six) hours as needed for wheezing or shortness of breath. 1 Inhaler 2  . amLODipine (NORVASC) 10 MG tablet Take 1 tablet (10 mg total) by mouth daily. 30 tablet 5  . atorvastatin (LIPITOR) 40 MG tablet Take 1 tablet (40 mg total) by mouth daily. 30 tablet 11  . cetirizine (ZYRTEC) 10 MG tablet Take 1 tablet (10 mg total) by mouth daily. (Patient taking differently: Take 10 mg by mouth as needed. ) 30 tablet 11  . hydrochlorothiazide (HYDRODIURIL) 25 MG tablet Take 1 tablet (25 mg total) by mouth daily. 30 tablet 2  . losartan (COZAAR) 100 MG tablet Take 1 tablet (100 mg total) by mouth daily. 30 tablet 5  . omeprazole (PRILOSEC) 40 MG capsule Take 1 capsule (40 mg total) by mouth daily. (Patient taking differently: Take 40 mg by mouth as needed. ) 30 capsule 3  . triamcinolone cream (KENALOG) 0.1 % Apply 1 application topically 2 (two) times daily. 30 g 0   No current facility-administered medications for  this visit.     Allergies:   Shellfish allergy    Social History:  The patient  reports that he has been smoking Cigarettes.  He has been smoking about 0.10 packs per day. He has never used smokeless tobacco. He reports that he drinks about 2.4 - 6.0 oz of alcohol per week . He reports that he does not use drugs.   Family History:  The patient's family history includes Heart attack in his mother; Heart failure in his mother.    ROS:  General:no colds or fevers, no weight changes Skin:no rashes or ulcers HEENT:no blurred vision, no congestion CV:see HPI PUL:see HPI GI:no diarrhea constipation or melena, no indigestion GU:no hematuria, no dysuria MS:no joint pain, no claudication Neuro:no syncope, no lightheadedness Endo:no diabetes, no thyroid disease  Wt Readings from Last 3 Encounters:  10/15/16 285 lb (129.3 kg)  09/17/16 291 lb (132 kg)  08/07/16 286 lb 12.8 oz (130.1 kg)     PHYSICAL EXAM: VS:  BP 132/74   Pulse 68   Ht 6\' 5"  (1.956 m)   Wt 285 lb (129.3 kg)   BMI 33.80 kg/m  , BMI Body mass index is 33.8 kg/m. General:Pleasant affect, NAD Skin:Warm and dry, brisk capillary refill HEENT:normocephalic, sclera clear, mucus membranes moist Neck:supple, no JVD, no  bruits  Heart:S1S2 RRR without murmur, gallup, rub or click Lungs: without rales, rhonchi, rare wheeze [id number], non tender, + BS, do not palpate liver spleen or masses Ext:no lower ext edema, 2+ pedal pulses, 2+ radial pulses Neuro:alert and oriented X 3, MAE, follows commands, + facial symmetry    EKG:  EKG is ordered today. The ekg ordered today demonstrates SR with rate of 68 and normal EKG.   Recent Labs: 09/17/2016: ALT 16; BUN 11; Creat 0.92; Hemoglobin 12.3; Platelets 274; Potassium 4.2; Sodium 140    Lipid Panel    Component Value Date/Time   CHOL 206 (H) 09/17/2016 1727   TRIG 128 09/17/2016 1727   HDL 77 09/17/2016 1727   CHOLHDL 2.7 09/17/2016 1727   VLDL 26 09/17/2016 1727    LDLCALC 103 (H) 09/17/2016 1727       Other studies Reviewed: Additional studies/ records that were reviewed today include: previous notes.   ASSESSMENT AND PLAN:  1.  Chest pain, somewhat atypical but with premature hx of CAD in mother, HTN and hyperlipidemia will do ETT.  If normal may return prn, if + will follow up with Dr. Johnsie Cancel.  I reviewed pt with Dr. Johnsie Cancel today and he agreed with plan.  2. HTN controlled.  3. HLD treated on lipitor.  4. Asthma. Treated.     Current medicines are reviewed with the patient today.  The patient Has no concerns regarding medicines.  The following changes have been made:  See above Labs/ tests ordered today include:see above  Disposition:   FU:  see above  Signed, Cecilie Kicks, NP  10/15/2016 4:23 PM    [facility] [address], [place], [place] Phone: [phone]; Fax: [phone]

## 2016-10-15 NOTE — Patient Instructions (Signed)
Medication Instructions:  Your physician recommends that you continue on your current medications as directed. Please refer to the Current Medication list given to you today.   Labwork: None ordered  Testing/Procedures: Your physician has requested that you have an exercise tolerance test. For further information please visit [email or web address]. Please also follow instruction sheet, as given.    Follow-Up: Your physician recommends that you schedule a follow-up appointment as needed with Dr.Nishan   Any Other Special Instructions Will Be Listed Below (If Applicable).     If you need a refill on your cardiac medications before your next appointment, please call your pharmacy.

## 2016-11-11 ENCOUNTER — Ambulatory Visit (INDEPENDENT_AMBULATORY_CARE_PROVIDER_SITE_OTHER): Payer: BLUE CROSS/BLUE SHIELD

## 2016-11-11 DIAGNOSIS — R079 Chest pain, unspecified: Secondary | ICD-10-CM

## 2016-11-11 LAB — EXERCISE TOLERANCE TEST
CHL CUP MPHR: 163 {beats}/min
CHL RATE OF PERCEIVED EXERTION: 16
CSEPEDS: 30 s
CSEPHR: 92 %
Estimated workload: 7 METS
Exercise duration (min): 5 min
Peak HR: 150 {beats}/min
Rest HR: 67 {beats}/min

## 2016-11-12 ENCOUNTER — Telehealth: Payer: Self-pay | Admitting: Cardiovascular Disease

## 2016-11-12 NOTE — Telephone Encounter (Signed)
New Message ° °Pt voiced returning nurses call. °

## 2016-11-12 NOTE — Telephone Encounter (Signed)
Tried to call pt back, pt was having phone static and I couldn't hear him.

## 2016-11-30 MED FILL — LOSARTAN POTASSIUM 100 MG T: 100 | 30 days supply | Qty: 30 | Fill #3

## 2016-11-30 MED FILL — HYDROCHLOROTHIAZIDE 25 MG T: 25 | 30 days supply | Qty: 30 | Fill #1

## 2016-11-30 MED FILL — AMLODIPINE BESYLATE 10 MG T: 10 | 30 days supply | Qty: 30 | Fill #3

## 2016-11-30 MED FILL — ATORVASTATIN 40 MG TABLET: 40 | 30 days supply | Qty: 30 | Fill #1

## 2016-12-01 ENCOUNTER — Other Ambulatory Visit: Payer: Self-pay | Admitting: Family Medicine

## 2016-12-01 DIAGNOSIS — J4540 Moderate persistent asthma, uncomplicated: Secondary | ICD-10-CM

## 2016-12-01 MED FILL — OMEPRAZOLE 40 MG CPDR: 40 | 30 days supply | Qty: 30 | Fill #1

## 2016-12-02 ENCOUNTER — Other Ambulatory Visit: Payer: Self-pay | Admitting: Family Medicine

## 2016-12-02 DIAGNOSIS — J4540 Moderate persistent asthma, uncomplicated: Secondary | ICD-10-CM

## 2016-12-03 ENCOUNTER — Other Ambulatory Visit: Payer: Self-pay | Admitting: Family Medicine

## 2016-12-03 DIAGNOSIS — J4540 Moderate persistent asthma, uncomplicated: Secondary | ICD-10-CM

## 2017-01-08 MED FILL — HYDROCHLOROTHIAZIDE 25 MG T: 25 | 30 days supply | Qty: 30 | Fill #2

## 2017-01-08 MED FILL — ATORVASTATIN 40 MG TABLET: 40 | 30 days supply | Qty: 30 | Fill #2

## 2017-01-08 MED FILL — AMLODIPINE BESYLATE 10 MG T: 10 | 30 days supply | Qty: 30 | Fill #4

## 2017-01-08 MED FILL — LOSARTAN POTASSIUM 100 MG T: 100 | 30 days supply | Qty: 30 | Fill #4

## 2017-01-25 ENCOUNTER — Emergency Department (HOSPITAL_COMMUNITY)
Admission: EM | Admit: 2017-01-25 | Discharge: 2017-01-25 | Disposition: A | Discharge status: Home / Self Care | Payer: BLUE CROSS/BLUE SHIELD | Attending: Emergency Medicine | Admitting: Emergency Medicine

## 2017-01-25 ENCOUNTER — Emergency Department (HOSPITAL_COMMUNITY): Payer: BLUE CROSS/BLUE SHIELD

## 2017-01-25 ENCOUNTER — Encounter (HOSPITAL_COMMUNITY): Payer: Self-pay | Admitting: Emergency Medicine

## 2017-01-25 DIAGNOSIS — I10 Essential (primary) hypertension: Secondary | ICD-10-CM | POA: Diagnosis not present

## 2017-01-25 DIAGNOSIS — F17210 Nicotine dependence, cigarettes, uncomplicated: Secondary | ICD-10-CM | POA: Insufficient documentation

## 2017-01-25 DIAGNOSIS — J4521 Mild intermittent asthma with (acute) exacerbation: Secondary | ICD-10-CM | POA: Diagnosis not present

## 2017-01-25 DIAGNOSIS — J45909 Unspecified asthma, uncomplicated: Secondary | ICD-10-CM | POA: Diagnosis not present

## 2017-01-25 MED ORDER — IPRATROPIUM-ALBUTEROL 0.5-2.5 (3) MG/3ML IN SOLN
3.0000 mL | Freq: Once | RESPIRATORY_TRACT | Status: AC
  Administered 2017-01-25: 3 mL via RESPIRATORY_TRACT
  Filled 2017-01-25: qty 3

## 2017-01-25 MED ORDER — CETIRIZINE HCL 10 MG PO TABS: 10.0000 mg | ORAL_TABLET | Freq: Every day | ORAL | 1 refills | Status: DC

## 2017-01-25 MED ORDER — ALBUTEROL SULFATE HFA 108 (90 BASE) MCG/ACT IN AERS
2.0000 | INHALATION_SPRAY | Freq: Once | RESPIRATORY_TRACT | Status: AC
  Administered 2017-01-25: 2 via RESPIRATORY_TRACT
  Filled 2017-01-25: qty 6.7

## 2017-01-25 NOTE — ED Notes (Signed)
ED Provider at bedside. 

## 2017-01-25 NOTE — ED Provider Notes (Signed)
[facility] DEPT Provider Note   CSN: [id number] Arrival date & time: 01/25/17  0702     History   Chief Complaint Chief Complaint  Patient presents with  . Asthma    HPI Edward Wilson is a 56 y.o. male.  HPI   Patient is a 56 year old male with history of asthma and hypertension who presents the ED with complaint of asthma exacerbation. Patient reports over the past 6 days he has had gradually worsening asthma with associated wheezing, productive cough. He also states he has been having worsening symptoms related to seasonal allergies after he ran out of his old prescription of Zyrtec, endorses associated watery/UTIs, rhinorrhea and postnasal drip. Patient notes he has been using his albuterol inhaler at home earlier this week but states he ran out this morning. Denies fever, chills, headache, facial swelling, hemoptysis, chest pain, shortness of breath, abdominal pain, vomiting, leg swelling, rash. Patient requesting refill of albuterol inhaler and Zyrtec. Denies any known sick contacts. Denies any recent hospitalizations. Patient endorese continuing to smoke intermittently.  Past Medical History:  Diagnosis Date  . Asthma   . Hypertension     Patient Active Problem List   Diagnosis Date Noted  . Chest pain 09/20/2016  . Skin rash 09/18/2016  . Pure hypercholesterolemia 09/18/2016  . Asthma, chronic 08/07/2016  . Syphilis 10/14/2015  . Essential hypertension, benign 02/12/2015  . Arthralgia 02/12/2015  . Tobacco use disorder 02/12/2015  . Alcohol use 02/12/2015    History reviewed. No pertinent surgical history.     Home Medications    Prior to Admission medications   Medication Sig Start Date End Date Taking? Authorizing Provider  amLODipine (NORVASC) 10 MG tablet Take 1 tablet (10 mg total) by mouth daily. 08/07/16   Josalyn Funches, MD  atorvastatin (LIPITOR) 40 MG tablet Take 1 tablet (40 mg total) by mouth daily. 09/18/16   Boykin Nearing, MD  cetirizine  (ZYRTEC ALLERGY) 10 MG tablet Take 1 tablet (10 mg total) by mouth daily. 01/25/17   Nona Dell, PA-C  cetirizine (ZYRTEC) 10 MG tablet Take 1 tablet (10 mg total) by mouth daily. Patient taking differently: Take 10 mg by mouth as needed.  12/13/15   Lance Bosch, NP  hydrochlorothiazide (HYDRODIURIL) 25 MG tablet Take 1 tablet (25 mg total) by mouth daily. 09/17/16   Josalyn Funches, MD  losartan (COZAAR) 100 MG tablet Take 1 tablet (100 mg total) by mouth daily. 08/07/16   Josalyn Funches, MD  omeprazole (PRILOSEC) 40 MG capsule Take 1 capsule (40 mg total) by mouth daily. Patient taking differently: Take 40 mg by mouth as needed.  09/20/16   Josalyn Funches, MD  triamcinolone cream (KENALOG) 0.1 % Apply 1 application topically 2 (two) times daily. 09/17/16   Josalyn Funches, MD  VENTOLIN HFA 108 (90 Base) MCG/ACT inhaler INHALE 1-2 PUFFS INTO THE LUNGS EVERY 6 HOURS AS NEEDED FOR WHEEZING OR SHORTNESS OF BREATH. 12/01/16   Boykin Nearing, MD    Family History Family History  Problem Relation Age of Onset  . Heart failure Mother     heart attack  . Heart attack Mother   . Colon cancer Neg Hx     Social History Social History  Substance Use Topics  . Smoking status: Current Every Day Smoker    Packs/day: 0.10    Types: Cigarettes  . Smokeless tobacco: Never Used     Comment: Smoking 1-2 cigs 3 days per week  . Alcohol use 2.4 - 6.0 oz/week  4 - 10 Cans of beer per week     Allergies   Shellfish allergy   Review of Systems Review of Systems  HENT: Positive for congestion and rhinorrhea.   Eyes: Positive for discharge (watery) and itching.  Respiratory: Positive for cough and wheezing.   All other systems reviewed and are negative.    Physical Exam Updated Vital Signs BP 124/71   Pulse 60   Temp 97.8 F (36.6 C) (Oral)   Resp 17   Ht 6\' 5"  (1.956 m)   Wt 127 kg   SpO2 99%   BMI 33.20 kg/m   Physical Exam  Constitutional: He is oriented to  person, place, and time. He appears well-developed and well-nourished. No distress.  HENT:  Head: Normocephalic and atraumatic.  Right Ear: Tympanic membrane normal.  Left Ear: Tympanic membrane normal.  Nose: Rhinorrhea present. Right sinus exhibits no maxillary sinus tenderness and no frontal sinus tenderness. Left sinus exhibits no maxillary sinus tenderness and no frontal sinus tenderness.  Mouth/Throat: Uvula is midline, oropharynx is clear and moist and mucous membranes are normal. No oropharyngeal exudate, posterior oropharyngeal edema, posterior oropharyngeal erythema or tonsillar abscesses. No tonsillar exudate.  Eyes: Conjunctivae and EOM are normal. Pupils are equal, round, and reactive to light. Right eye exhibits no discharge. Left eye exhibits no discharge. No scleral icterus.  Neck: Normal range of motion. Neck supple.  Cardiovascular: Normal rate, regular rhythm, normal heart sounds and intact distal pulses.   Pulmonary/Chest: Effort normal. No respiratory distress. He has wheezes. He has no rales. He exhibits no tenderness.  Mild diffuse expiratory wheezing throughout. No signs of increased work of breathing.  Abdominal: Soft. Bowel sounds are normal. He exhibits no distension. There is no tenderness.  Musculoskeletal: He exhibits no edema.  Lymphadenopathy:    He has no cervical adenopathy.  Neurological: He is alert and oriented to person, place, and time.  Skin: Skin is warm and dry. He is not diaphoretic.  Nursing note and vitals reviewed.    ED Treatments / Results  Labs (all labs ordered are listed, but only abnormal results are displayed) Labs Reviewed - No data to display  EKG  EKG Interpretation None       Radiology Dg Chest 2 View  Result Date: 01/25/2017 CLINICAL DATA:  Asthma. EXAM: CHEST  2 VIEW COMPARISON:  11/27/2014 . FINDINGS: Mediastinum and hilar structures normal. Lungs are clear. Heart size normal. No pleural effusion or pneumothorax.  Degenerative changes thoracic spine. IMPRESSION: No acute cardiopulmonary disease . Electronically Signed   By: Marcello Moores  Register   On: 01/25/2017 07:59    Procedures Procedures (including critical care time)  Medications Ordered in ED Medications  ipratropium-albuterol (DUONEB) 0.5-2.5 (3) MG/3ML nebulizer solution 3 mL (3 mLs Nebulization Given 01/25/17 0802)  albuterol (PROVENTIL HFA;VENTOLIN HFA) 108 (90 Base) MCG/ACT inhaler 2 puff (2 puffs Inhalation Given 01/25/17 0802)     Initial Impression / Assessment and Plan / ED Course  I have reviewed the triage vital signs and the nursing notes.  Pertinent labs & imaging results that were available during my care of the patient were reviewed by me and considered in my medical decision making (see chart for details).     Patient presents with asthma exacerbation. Reports running out of his albuterol inhaler this morning. Denies fever, chest pain, hemoptysis. Also requesting refill of allergy medication. VSS. Exam revealed mild diffuse expiratory wheezing throughout, patient without evidence of increased work of breathing. O2 saturation 100% on room  air. Patient given DuoNeb treatment in the ED. Chest x-ray negative. On reevaluation patient reports significant improvement of symptoms. Reexamination revealed significant improvement of wheezing throughout. Plan to discharge patient home with albuterol inhaler and prescription for Zyrtec. Advised patient to follow up with PCP within the next week for follow-up evaluation and further management. Discussed return precautions.  Final Clinical Impressions(s) / ED Diagnoses   Final diagnoses:  Mild intermittent asthma with exacerbation    New Prescriptions New Prescriptions   CETIRIZINE (ZYRTEC ALLERGY) 10 MG TABLET    Take 1 tablet (10 mg total) by mouth daily.     Chesley Noon [place], [place] 01/25/17 [place], MD 01/26/17 2256

## 2017-01-25 NOTE — Discharge Instructions (Signed)
Take your medication as prescribed. I recommend continuing to use her albuterol inhaler as prescribed as needed for wheezing and shortness of breath. Follow-up with your primary care provider within the next week for follow-up evaluation. Please return to the Emergency Department if symptoms worsen or new onset of fever, chest pain, difficulty breathing, coughing up blood, vomiting.

## 2017-01-25 NOTE — ED Triage Notes (Signed)
Pt reports asthma flare up since Wednesday, reports productive cough, denies pain, states he has been using albuterol inhaler. resp e/u, nad. Speaking in full sentences.

## 2017-01-25 NOTE — ED Notes (Signed)
Patient transported to X-ray 

## 2017-02-01 ENCOUNTER — Encounter: Payer: Self-pay | Admitting: Family Medicine

## 2017-02-01 ENCOUNTER — Ambulatory Visit: Payer: BLUE CROSS/BLUE SHIELD | Attending: Family Medicine | Admitting: Family Medicine

## 2017-02-01 VITALS — BP 122/67 | HR 82 | Temp 98.4°F | Ht 77.0 in | Wt 277.6 lb

## 2017-02-01 DIAGNOSIS — I10 Essential (primary) hypertension: Secondary | ICD-10-CM | POA: Insufficient documentation

## 2017-02-01 DIAGNOSIS — J45909 Unspecified asthma, uncomplicated: Secondary | ICD-10-CM | POA: Diagnosis present

## 2017-02-01 DIAGNOSIS — Z79899 Other long term (current) drug therapy: Secondary | ICD-10-CM | POA: Diagnosis not present

## 2017-02-01 DIAGNOSIS — J4540 Moderate persistent asthma, uncomplicated: Secondary | ICD-10-CM | POA: Diagnosis not present

## 2017-02-01 DIAGNOSIS — Z8619 Personal history of other infectious and parasitic diseases: Secondary | ICD-10-CM

## 2017-02-01 DIAGNOSIS — F17210 Nicotine dependence, cigarettes, uncomplicated: Secondary | ICD-10-CM | POA: Diagnosis not present

## 2017-02-01 DIAGNOSIS — L309 Dermatitis, unspecified: Secondary | ICD-10-CM | POA: Insufficient documentation

## 2017-02-01 MED ORDER — LOSARTAN POTASSIUM 100 MG PO TABS: 100.0000 mg | ORAL_TABLET | Freq: Every day | ORAL | 5 refills | Status: DC

## 2017-02-01 MED ORDER — AMLODIPINE BESYLATE 10 MG PO TABS: 10.0000 mg | ORAL_TABLET | Freq: Every day | ORAL | 5 refills | Status: DC

## 2017-02-01 MED ORDER — ALBUTEROL SULFATE HFA 108 (90 BASE) MCG/ACT IN AERS: 2.0000 | INHALATION_SPRAY | Freq: Four times a day (QID) | RESPIRATORY_TRACT | 5 refills | Status: DC | PRN

## 2017-02-01 MED ORDER — TRIAMCINOLONE ACETONIDE 0.5 % EX OINT: 1.0000 "application " | TOPICAL_OINTMENT | Freq: Two times a day (BID) | CUTANEOUS | 0 refills | Status: DC

## 2017-02-01 MED ORDER — CETIRIZINE HCL 10 MG PO TABS: 10.0000 mg | ORAL_TABLET | Freq: Every day | ORAL | 5 refills | Status: DC

## 2017-02-01 MED ORDER — MONTELUKAST SODIUM 10 MG PO TABS: 10.0000 mg | ORAL_TABLET | Freq: Every day | ORAL | 2 refills | Status: DC

## 2017-02-01 MED ORDER — HYDROCHLOROTHIAZIDE 25 MG PO TABS: 25.0000 mg | ORAL_TABLET | Freq: Every day | ORAL | 5 refills | Status: DC

## 2017-02-01 NOTE — Assessment & Plan Note (Signed)
Recent exacerbation Resolved Refilled albuterol, zyrtec, add singular Smoking cessation addressed, patient has quit

## 2017-02-01 NOTE — Progress Notes (Addendum)
Subjective:  Patient ID: Edward Wilson, male    DOB: July 08, 1961  Age: 56 y.o. MRN: [id number]  CC: Follow-up   HPI Edward Wilson has asthma, HTN, gout, hx of alcohol abuse he presents for    1. ED follow up: patient was seen at [facility] ED on 01/25/17 for asthma exacerbation. He reports that he quit smoking 1 week ago. He reported 6 days of productive cough and wheezing. A chest x-ray was obtained and normal. He reports feeling well today. He reports pollen flares his asthma.   2. Hand rash: x 1 months. Reports wearing wet gloves at work. No itching. He has history of sensitive skin with sensitivity to soaps in the past. He reports rash on chest from 3 months ago resolved. No rash on back or arms or legs.   Social History  Substance Use Topics  . Smoking status: Current Every Day Smoker    Packs/day: 0.10    Types: Cigarettes  . Smokeless tobacco: Never Used     Comment: Smoking 1-2 cigs 3 days per week  . Alcohol use 2.4 - 6.0 oz/week    4 - 10 Cans of beer per week    Social History  Substance Use Topics  . Smoking status: Current Every Day Smoker    Packs/day: 0.10    Types: Cigarettes  . Smokeless tobacco: Never Used     Comment: Smoking 1-2 cigs 3 days per week  . Alcohol use 2.4 - 6.0 oz/week    4 - 10 Cans of beer per week    Outpatient Medications Prior to Visit  Medication Sig Dispense Refill  . amLODipine (NORVASC) 10 MG tablet Take 1 tablet (10 mg total) by mouth daily. 30 tablet 5  . atorvastatin (LIPITOR) 40 MG tablet Take 1 tablet (40 mg total) by mouth daily. 30 tablet 11  . cetirizine (ZYRTEC ALLERGY) 10 MG tablet Take 1 tablet (10 mg total) by mouth daily. 30 tablet 1  . cetirizine (ZYRTEC) 10 MG tablet Take 1 tablet (10 mg total) by mouth daily. (Patient taking differently: Take 10 mg by mouth as needed. ) 30 tablet 11  . hydrochlorothiazide (HYDRODIURIL) 25 MG tablet Take 1 tablet (25 mg total) by mouth daily. 30 tablet 2  . losartan (COZAAR) 100 MG  tablet Take 1 tablet (100 mg total) by mouth daily. 30 tablet 5  . omeprazole (PRILOSEC) 40 MG capsule Take 1 capsule (40 mg total) by mouth daily. (Patient taking differently: Take 40 mg by mouth as needed. ) 30 capsule 3  . triamcinolone cream (KENALOG) 0.1 % Apply 1 application topically 2 (two) times daily. 30 g 0  . VENTOLIN HFA 108 (90 Base) MCG/ACT inhaler INHALE 1-2 PUFFS INTO THE LUNGS EVERY 6 HOURS AS NEEDED FOR WHEEZING OR SHORTNESS OF BREATH. 18 g 1   No facility-administered medications prior to visit.     ROS Review of Systems  Constitutional: Negative for chills, fatigue, fever and unexpected weight change.  Eyes: Negative for visual disturbance.  Respiratory: Negative for cough and shortness of breath.   Cardiovascular: Negative for chest pain, palpitations and leg swelling.  Gastrointestinal: Negative for abdominal pain, blood in stool, constipation, diarrhea, nausea and vomiting.  Endocrine: Negative for polydipsia, polyphagia and polyuria.  Musculoskeletal: Negative for arthralgias, back pain, gait problem, myalgias and neck pain.  Skin: Positive for rash.  Allergic/Immunologic: Negative for immunocompromised state.  Hematological: Negative for adenopathy. Does not bruise/bleed easily.  Psychiatric/Behavioral: Negative for dysphoric mood, sleep disturbance  and suicidal ideas. The patient is not nervous/anxious.     Objective:  BP 122/67   Pulse 82   Temp 98.4 F (36.9 C) (Oral)   Ht 6\' 5"  (1.956 m)   Wt 277 lb 9.6 oz (125.9 kg)   SpO2 95%   BMI 32.92 kg/m   BP/Weight 02/01/2017 01/25/2017 4/35/6861  Systolic BP 683 729 021  Diastolic BP 67 71 74  Wt. (Lbs) 277.6 280 285  BMI 32.92 33.2 33.8   Physical Exam  Constitutional: He appears well-developed and well-nourished. No distress.  HENT:  Head: Normocephalic and atraumatic.  Neck: Normal range of motion. Neck supple.  Cardiovascular: Normal rate, regular rhythm, normal heart sounds and intact distal  pulses.   Pulmonary/Chest: Effort normal and breath sounds normal.  Musculoskeletal: He exhibits no edema.  Neurological: He is alert.  Skin: Skin is warm and dry. No rash noted. No erythema.     Psychiatric: He has a normal mood and affect.     Assessment & Plan:  Edward Wilson was seen today for follow-up.  Diagnoses and all orders for this visit:  Moderate persistent chronic asthma without complication -     cetirizine (ZYRTEC ALLERGY) 10 MG tablet; Take 1 tablet (10 mg total) by mouth daily. -     albuterol (VENTOLIN HFA) 108 (90 Base) MCG/ACT inhaler; Inhale 2 puffs into the lungs every 6 (six) hours as needed for wheezing or shortness of breath. -     montelukast (SINGULAIR) 10 MG tablet; Take 1 tablet (10 mg total) by mouth at bedtime.  Essential hypertension, benign -     amLODipine (NORVASC) 10 MG tablet; Take 1 tablet (10 mg total) by mouth daily. -     losartan (COZAAR) 100 MG tablet; Take 1 tablet (100 mg total) by mouth daily. -     hydrochlorothiazide (HYDRODIURIL) 25 MG tablet; Take 1 tablet (25 mg total) by mouth daily.  Hand dermatitis -     triamcinolone ointment (KENALOG) 0.5 %; Apply 1 application topically 2 (two) times daily. To hands for up to 2 weeks   There are no diagnoses linked to this encounter.  No orders of the defined types were placed in this encounter.   Follow-up: No Follow-up on file.   Boykin Nearing MD

## 2017-02-01 NOTE — Assessment & Plan Note (Signed)
A: hand dermatitis P: kenalog ointment  Daily moisturizer

## 2017-02-01 NOTE — Assessment & Plan Note (Signed)
Hep C in teens, treated Ab positive with titer of 1:8, in 10/2015

## 2017-02-01 NOTE — Patient Instructions (Addendum)
Edward Wilson was seen today for follow-up.  Diagnoses and all orders for this visit:  Moderate persistent chronic asthma without complication -     cetirizine (ZYRTEC ALLERGY) 10 MG tablet; Take 1 tablet (10 mg total) by mouth daily. -     albuterol (VENTOLIN HFA) 108 (90 Base) MCG/ACT inhaler; Inhale 2 puffs into the lungs every 6 (six) hours as needed for wheezing or shortness of breath. -     montelukast (SINGULAIR) 10 MG tablet; Take 1 tablet (10 mg total) by mouth at bedtime.  Essential hypertension, benign -     amLODipine (NORVASC) 10 MG tablet; Take 1 tablet (10 mg total) by mouth daily. -     losartan (COZAAR) 100 MG tablet; Take 1 tablet (100 mg total) by mouth daily. -     hydrochlorothiazide (HYDRODIURIL) 25 MG tablet; Take 1 tablet (25 mg total) by mouth daily.  Hand dermatitis -     triamcinolone ointment (KENALOG) 0.5 %; Apply 1 application topically 2 (two) times daily. To hands for up to 2 weeks  add singular 10 mg daily to prevent asthma flares Also if you have not changed the filters in your home Edward Wilson system, change them  congratulations on quitting smoking  Lab Results  Component Value Date   HCVAB NEGATIVE 09/17/2016    f/u in 3 months for HTN sooner if needed   Dr. Adrian Blackwater   Low-Purine Diet Purines are compounds that affect the level of uric acid in your body. A low-purine diet is a diet that is low in purines. Eating a low-purine diet can prevent the level of uric acid in your body from getting too high and causing gout or kidney stones or both. What do I need to know about this diet?  Choose low-purine foods. Examples of low-purine foods are listed in the next section.  Drink plenty of fluids, especially water. Fluids can help remove uric acid from your body. Try to drink 8-16 cups (1.9-3.8 L) a day.  Limit foods high in fat, especially saturated fat, as fat makes it harder for the body to get rid of uric acid. Foods high in saturated fat include pizza, cheese, ice  cream, whole milk, fried foods, and gravies. Choose foods that are lower in fat and lean sources of protein. Use olive oil when cooking as it contains healthy fats that are not high in saturated fat.  Limit alcohol. Alcohol interferes with the elimination of uric acid from your body. If you are having a gout attack, avoid all alcohol.  Keep in mind that different people's bodies react differently to different foods. You will probably learn over time which foods do or do not affect you. If you discover that a food tends to cause your gout to flare up, avoid eating that food. You can more freely enjoy foods that do not cause problems. If you have any questions about a food item, talk to your dietitian or health care provider. Which foods are low, moderate, and high in purines? The following is a list of foods that are low, moderate, and high in purines. You can eat any amount of the foods that are low in purines. You may be able to have small amounts of foods that are moderate in purines. Ask your health care provider how much of a food moderate in purines you can have. Avoid foods high in purines. Grains   Foods low in purines: Enriched white bread, pasta, rice, cake, cornbread, popcorn.  Foods moderate in purines:  Whole-grain breads and cereals, wheat germ, bran, oatmeal. Uncooked oatmeal. Dry wheat bran or wheat germ.  Foods high in purines: Pancakes, [place] toast, biscuits, muffins. Vegetables   Foods low in purines: All vegetables, except those that are moderate in purines.  Foods moderate in purines: Asparagus, cauliflower, spinach, mushrooms, green peas. Fruits   All fruits are low in purines. Meats and other Protein Foods   Foods low in purines: Eggs, nuts, peanut butter.  Foods moderate in purines: 80-90% lean beef, lamb, veal, pork, poultry, fish, eggs, peanut butter, nuts. Crab, lobster, oysters, and shrimp. Cooked dried beans, peas, and lentils.  Foods high in purines:  Anchovies, sardines, herring, mussels, tuna, codfish, scallops, trout, and haddock. Edward Wilson. Organ meats (such as liver or kidney). Tripe. Game meat. Goose. Sweetbreads. Dairy   All dairy foods are low in purines. Low-fat and fat-free dairy products are best because they are low in saturated fat. Beverages   Drinks low in purines: Water, carbonated beverages, tea, coffee, cocoa.  Drinks moderate in purines: Soft drinks and other drinks sweetened with high-fructose corn syrup. Juices. To find whether a food or drink is sweetened with high-fructose corn syrup, look at the ingredients list.  Drinks high in purines: Alcoholic beverages (such as beer). Condiments   Foods low in purines: Salt, herbs, olives, pickles, relishes, vinegar.  Foods moderate in purines: Butter, margarine, oils, mayonnaise. Fats and Oils   Foods low in purines: All types, except gravies and sauces made with meat.  Foods high in purines: Gravies and sauces made with meat. Other Foods   Foods low in purines: Sugars, sweets, gelatin. Cake. Soups made without meat.  Foods moderate in purines: Meat-based or fish-based soups, broths, or bouillons. Foods and drinks sweetened with high-fructose corn syrup.  Foods high in purines: High-fat desserts (such as ice cream, cookies, cakes, pies, doughnuts, and chocolate). Contact your dietitian for more information on foods that are not listed here.  This information is not intended to replace advice given to you by your health care provider. Make sure you discuss any questions you have with your health care provider. Document Released: 01/16/2011 Document Revised: 02/27/2016 Document Reviewed: 08/28/2013 Elsevier Interactive Patient Education  2017 [employer].

## 2017-02-03 MED FILL — ?CETIRIZINE HCL 10 MG TABLE: 10 | 30 days supply | Qty: 30 | Fill #0

## 2017-02-03 MED FILL — TRIAMCINOLONE 0.5% OINTMENT: 0.5 | 30 days supply | Qty: 30 | Fill #0

## 2017-02-03 MED FILL — ?MONTELUKAST SOD 10 MG TAB: 10 | 30 days supply | Qty: 30 | Fill #0

## 2017-02-03 MED FILL — AMLODIPINE BESYLATE 10 MG T: 10 | 30 days supply | Qty: 30 | Fill #0

## 2017-02-03 MED FILL — HYDROCHLOROTHIAZIDE 25 MG T: 25 | 30 days supply | Qty: 30 | Fill #0

## 2017-02-03 MED FILL — LOSARTAN POTASSIUM 100 MG T: 100 | 30 days supply | Qty: 30 | Fill #0

## 2017-02-05 MED FILL — VENTOLIN HFA 90 MCG INHALER: 108 (90 BAS | 25 days supply | Qty: 18 | Fill #0

## 2017-02-17 ENCOUNTER — Encounter: Payer: Self-pay | Admitting: Family Medicine

## 2017-05-03 ENCOUNTER — Ambulatory Visit: Payer: BLUE CROSS/BLUE SHIELD | Admitting: Family Medicine

## 2017-06-11 ENCOUNTER — Ambulatory Visit: Payer: BLUE CROSS/BLUE SHIELD | Attending: Family Medicine | Admitting: Family Medicine

## 2017-06-11 ENCOUNTER — Encounter: Payer: Self-pay | Admitting: Family Medicine

## 2017-06-11 VITALS — BP 154/80 | HR 71 | Temp 98.1°F | Resp 18 | Ht 77.0 in | Wt 281.2 lb

## 2017-06-11 DIAGNOSIS — I10 Essential (primary) hypertension: Secondary | ICD-10-CM

## 2017-06-11 DIAGNOSIS — M79672 Pain in left foot: Secondary | ICD-10-CM | POA: Insufficient documentation

## 2017-06-11 DIAGNOSIS — J4540 Moderate persistent asthma, uncomplicated: Secondary | ICD-10-CM | POA: Diagnosis not present

## 2017-06-11 DIAGNOSIS — M2550 Pain in unspecified joint: Secondary | ICD-10-CM | POA: Insufficient documentation

## 2017-06-11 DIAGNOSIS — J45909 Unspecified asthma, uncomplicated: Secondary | ICD-10-CM | POA: Insufficient documentation

## 2017-06-11 DIAGNOSIS — Z131 Encounter for screening for diabetes mellitus: Secondary | ICD-10-CM | POA: Diagnosis not present

## 2017-06-11 DIAGNOSIS — L298 Other pruritus: Secondary | ICD-10-CM

## 2017-06-11 DIAGNOSIS — M25572 Pain in left ankle and joints of left foot: Secondary | ICD-10-CM | POA: Insufficient documentation

## 2017-06-11 DIAGNOSIS — R21 Rash and other nonspecific skin eruption: Secondary | ICD-10-CM | POA: Insufficient documentation

## 2017-06-11 LAB — POCT GLYCOSYLATED HEMOGLOBIN (HGB A1C): Hemoglobin A1C: 5.5

## 2017-06-11 MED ORDER — CETIRIZINE HCL 10 MG PO TABS: 10.0000 mg | ORAL_TABLET | Freq: Every day | ORAL | 3 refills | Status: DC

## 2017-06-11 MED ORDER — LOSARTAN POTASSIUM 100 MG PO TABS: 100.0000 mg | ORAL_TABLET | Freq: Every day | ORAL | 3 refills | Status: DC

## 2017-06-11 MED ORDER — NAPROXEN 500 MG PO TABS: ORAL_TABLET | ORAL | 0 refills | Status: DC

## 2017-06-11 MED ORDER — MONTELUKAST SODIUM 10 MG PO TABS: 10.0000 mg | ORAL_TABLET | Freq: Every day | ORAL | 3 refills | Status: DC

## 2017-06-11 MED ORDER — ALBUTEROL SULFATE HFA 108 (90 BASE) MCG/ACT IN AERS: 2.0000 | INHALATION_SPRAY | Freq: Four times a day (QID) | RESPIRATORY_TRACT | 5 refills | Status: DC | PRN

## 2017-06-11 MED ORDER — TRIAMCINOLONE ACETONIDE 0.1 % EX CREA: 1.0000 "application " | TOPICAL_CREAM | Freq: Two times a day (BID) | CUTANEOUS | 0 refills | Status: DC

## 2017-06-11 MED ORDER — HYDROCHLOROTHIAZIDE 25 MG PO TABS: 25.0000 mg | ORAL_TABLET | Freq: Every day | ORAL | 3 refills | Status: DC

## 2017-06-11 MED ORDER — AMLODIPINE BESYLATE 10 MG PO TABS: 10.0000 mg | ORAL_TABLET | Freq: Every day | ORAL | 3 refills | Status: DC

## 2017-06-11 MED FILL — LOSARTAN POTASSIUM 100 MG T: 100 | 30 days supply | Qty: 30 | Fill #0

## 2017-06-11 MED FILL — NAPROXEN 500 MG TABLET: 500 | 20 days supply | Qty: 40 | Fill #0

## 2017-06-11 MED FILL — ?CETIRIZINE HCL 10 MG TABLE: 10 | 30 days supply | Qty: 30 | Fill #0

## 2017-06-11 MED FILL — HYDROCHLOROTHIAZIDE 25 MG T: 25 | 30 days supply | Qty: 30 | Fill #0

## 2017-06-11 MED FILL — MONTELUKAST SOD 10 MG TAB: 10 | 30 days supply | Qty: 30 | Fill #0

## 2017-06-11 MED FILL — AMLODIPINE BESYLATE 10 MG T: 10 | 30 days supply | Qty: 30 | Fill #0

## 2017-06-11 MED FILL — TRIAMCINOLONE 0.1% CREAM: 0.1 | 14 days supply | Qty: 30 | Fill #0

## 2017-06-11 NOTE — Progress Notes (Signed)
Patient is here for f/up HTN   Patient been with out BP med for 3 months now  Patient complains left foot sharp  pain

## 2017-06-11 NOTE — Patient Instructions (Signed)
Ankle Pain Many things can cause ankle pain, including an injury to the area and overuse of the ankle.The ankle joint holds your body weight and allows you to move around. Ankle pain can occur on either side or the back of one ankle or both ankles. Ankle pain may be sharp and burning or dull and aching. There may be tenderness, stiffness, redness, or warmth around the ankle. Follow these instructions at home: Activity  Rest your ankle as told by your health care provider. Avoid any activities that cause ankle pain.  Do exercises as told by your health care provider.  Ask your health care provider if you can drive. Using a brace, a bandage, or crutches  If you were given a brace: ? Wear it as told by your health care provider. ? Remove it when you take a bath or a shower. ? Try not to move your ankle very much, but wiggle your toes from time to time. This helps to prevent swelling.  If you were given an elastic bandage: ? Remove it when you take a bath or a shower. ? Try not to move your ankle very much, but wiggle your toes from time to time. This helps to prevent swelling. ? Adjust the bandage to make it more comfortable if it feels too tight. ? Loosen the bandage if you have numbness or tingling in your foot or if your foot turns cold and blue.  If you have crutches, use them as told by your health care provider. Continue to use them until you can walk without feeling pain in your ankle. Managing pain, stiffness, and swelling  Raise (elevate) your ankle above the level of your heart while you are sitting or lying down.  If directed, apply ice to the area: ? Put ice in a plastic bag. ? Place a towel between your skin and the bag. ? Leave the ice on for 20 minutes, 2-3 times per day. General instructions  Keep all follow-up visits as told by your health care provider. This is important.  Record this information that may be helpful for you and your health care provider: ? How  often you have ankle pain. ? Where the pain is located. ? What the pain feels like.  Take over-the-counter and prescription medicines only as told by your health care provider. Contact a health care provider if:  Your pain gets worse.  Your pain is not relieved with medicines.  You have a fever or chills.  You are having more trouble with walking.  You have new symptoms. Get help right away if:  Your foot, leg, toes, or ankle tingles or becomes numb.  Your foot, leg, toes, or ankle becomes swollen.  Your foot, leg, toes, or ankle turns pale or blue. This information is not intended to replace advice given to you by your health care provider. Make sure you discuss any questions you have with your health care provider. Document Released: 03/11/2010 Document Revised: 05/22/2016 Document Reviewed: 04/23/2015 Elsevier Interactive Patient Education  2017 [employer]vier Inc.  

## 2017-06-11 NOTE — Progress Notes (Signed)
Subjective:  Patient ID: Edward Wilson, male    DOB: 04/02/1961  Age: 56 y.o. MRN: [id number]  CC: Hypertension   HPI Edward Wilson presents for follow up. History of HTN and asthma. He is not exercising and is not adherent to low salt diet.  He does not shape BP at home. Cardiac symptoms none. Patient denies chest pain, chest pressure/discomfort, claudication, dyspnea, lower extremity edema, near-syncope, palpitations and syncope.  Cardiovascular risk factors: advanced age (older than 31 for men, 48 for women), dyslipidemia, hypertension, male gender and sedentary lifestyle. Use of agents associated with hypertension: none. History of target organ damage: none. History of asthma. He denies any increased inhaler use. He denies symptoms of  chest tightness, dyspnea, productive cough and wheezing.  Patient complains of arthralgias for which has been present for 1 month. Pain is located in the left ankle(s) and left foot, is described as sharp, and is intermittent .  Associated symptoms include: none.  The patient has tried nothing for pain relief.  Related to injury:   No. History of generalized rash. Rash started several years ago. Discomfort associated with rash: is pruritic.  Associated symptoms: none. Denies: fever. Patient has had previous evaluation of rash. Patient has had previous treatment.   Patient has not had contacts with similar rash. Patient has not identified precipitant. Patient has not had new exposures (soaps, lotions, laundry detergents, foods, medications, plants, insects or animals.).    Outpatient Medications Prior to Visit  Medication Sig Dispense Refill  . atorvastatin (LIPITOR) 40 MG tablet Take 1 tablet (40 mg total) by mouth daily. 30 tablet 11  . omeprazole (PRILOSEC) 40 MG capsule Take 1 capsule (40 mg total) by mouth daily. (Patient taking differently: Take 40 mg by mouth as needed. ) 30 capsule 3  . albuterol (VENTOLIN HFA) 108 (90 Base) MCG/ACT inhaler Inhale 2 puffs into  the lungs every 6 (six) hours as needed for wheezing or shortness of breath. 18 g 5  . amLODipine (NORVASC) 10 MG tablet Take 1 tablet (10 mg total) by mouth daily. 30 tablet 5  . cetirizine (ZYRTEC ALLERGY) 10 MG tablet Take 1 tablet (10 mg total) by mouth daily. 30 tablet 5  . hydrochlorothiazide (HYDRODIURIL) 25 MG tablet Take 1 tablet (25 mg total) by mouth daily. 30 tablet 5  . losartan (COZAAR) 100 MG tablet Take 1 tablet (100 mg total) by mouth daily. 30 tablet 5  . montelukast (SINGULAIR) 10 MG tablet Take 1 tablet (10 mg total) by mouth at bedtime. 30 tablet 2  . triamcinolone ointment (KENALOG) 0.5 % Apply 1 application topically 2 (two) times daily. To hands for up to 2 weeks 30 g 0   No facility-administered medications prior to visit.     ROS Review of Systems  Constitutional: Negative.   HENT: Negative.   Eyes: Negative.   Respiratory: Negative.   Cardiovascular: Negative.   Gastrointestinal: Negative.   Musculoskeletal: Positive for arthralgias.  Skin: Negative.   Psychiatric/Behavioral: Negative.     Objective:  BP (!) 154/80 (BP Location: Left Arm, Patient Position: Sitting, Cuff Size: Normal)   Pulse 71   Temp 98.1 F (36.7 C) (Oral)   Resp 18   Ht 6\' 5"  (1.956 m)   Wt 281 lb 3.2 oz (127.6 kg)   SpO2 96%   BMI 33.35 kg/m   BP/Weight 06/11/2017 02/01/2017 2/54/2706  Systolic BP 237 628 315  Diastolic BP 80 67 71  Wt. (Lbs) 281.2 277.6 280  BMI 33.35  32.92 33.2     Physical Exam  Constitutional: He appears well-developed and well-nourished.  HENT:  Head: Normocephalic and atraumatic.  Right Ear: External ear normal.  Left Ear: External ear normal.  Nose: Nose normal.  Mouth/Throat: Oropharynx is clear and moist.  Eyes: Pupils are equal, round, and reactive to light. Conjunctivae are normal.  Neck: Normal range of motion. Neck supple. No JVD present.  Cardiovascular: Normal rate, regular rhythm, normal heart sounds and intact distal pulses.     Pulmonary/Chest: Effort normal and breath sounds normal. No respiratory distress. He has no wheezes.  Abdominal: Soft. Bowel sounds are normal. There is no tenderness.  Musculoskeletal:       Left foot: There is bony tenderness. There is normal range of motion.  Skin: Skin is warm and dry.  Psychiatric: He has a normal mood and affect.  Nursing note and vitals reviewed.  Assessment & Plan:   Problem List Items Addressed This Visit      Cardiovascular and Mediastinum   Essential hypertension, benign - Primary (Chronic)   Schedule BP recheck in 2 weeks with clinic RN.   If BP is greater than 90/60 (MAP 65 or greater) but not less than 130/80 may add coreg 3.125 QD and recheck in another 2 weeks clinic RN.   Follow up with PCP in 3 months.   Relevant Medications   hydrochlorothiazide (HYDRODIURIL) 25 MG tablet   losartan (COZAAR) 100 MG tablet   amLODipine (NORVASC) 10 MG tablet   Other Relevant Orders   CMP and Liver (Completed)   Lipid Panel (Completed)     Respiratory   Asthma, chronic (Chronic)   Relevant Medications   albuterol (VENTOLIN HFA) 108 (90 Base) MCG/ACT inhaler   montelukast (SINGULAIR) 10 MG tablet   cetirizine (ZYRTEC ALLERGY) 10 MG tablet     Other   Arthralgia   Relevant Medications   naproxen (NAPROSYN) 500 MG tablet   Other Relevant Orders   Uric Acid (Completed)    Other Visit Diagnoses    Chronic pruritic rash in adult       Relevant Medications   triamcinolone cream (KENALOG) 0.1 %   Other Relevant Orders   Ambulatory referral to Dermatology   Diabetes mellitus screening       Relevant Orders   POCT glycosylated hemoglobin (Hb A1C) (Completed)      Meds ordered this encounter  Medications  . albuterol (VENTOLIN HFA) 108 (90 Base) MCG/ACT inhaler    Sig: Inhale 2 puffs into the lungs every 6 (six) hours as needed for wheezing or shortness of breath.    Dispense:  18 g    Refill:  5    Order Specific Question:   Supervising Provider     Answer:   Tresa Garter [id number]  . montelukast (SINGULAIR) 10 MG tablet    Sig: Take 1 tablet (10 mg total) by mouth at bedtime.    Dispense:  30 tablet    Refill:  3    Order Specific Question:   Supervising Provider    Answer:   Tresa Garter [id number]  . cetirizine (ZYRTEC ALLERGY) 10 MG tablet    Sig: Take 1 tablet (10 mg total) by mouth daily.    Dispense:  30 tablet    Refill:  3    Order Specific Question:   Supervising Provider    Answer:   Tresa Garter [id number]  . hydrochlorothiazide (HYDRODIURIL) 25 MG tablet    Sig: Take 1 tablet (  25 mg total) by mouth daily.    Dispense:  30 tablet    Refill:  3    Order Specific Question:   Supervising Provider    Answer:   Tresa Garter [id number]  . losartan (COZAAR) 100 MG tablet    Sig: Take 1 tablet (100 mg total) by mouth daily.    Dispense:  30 tablet    Refill:  3    Order Specific Question:   Supervising Provider    Answer:   Tresa Garter [id number]  . amLODipine (NORVASC) 10 MG tablet    Sig: Take 1 tablet (10 mg total) by mouth daily.    Dispense:  30 tablet    Refill:  3    Order Specific Question:   Supervising Provider    Answer:   Tresa Garter [id number]  . naproxen (NAPROSYN) 500 MG tablet    Sig: TAKE ONE TABLET BY MOUTH WITH MEALS TWICE A DAY FOR 5 DAYS. THEN TAKE ONE TABLET BY MOUTH WITH MEAL AS NEEDED.    Dispense:  40 tablet    Refill:  0    Order Specific Question:   Supervising Provider    Answer:   Tresa Garter [id number]  . triamcinolone cream (KENALOG) 0.1 %    Sig: Apply 1 application topically 2 (two) times daily. For 2 weeks    Dispense:  30 g    Refill:  0    Order Specific Question:   Supervising Provider    Answer:   Tresa Garter [[id number]]    Follow-up: Return in about 2 weeks (around 06/25/2017) for BP check with [place].   Alfonse Spruce FNP

## 2017-06-12 LAB — CMP AND LIVER
ALBUMIN: 4.5 g/dL (ref 3.5–5.5)
ALT: 11 IU/L (ref 0–44)
AST: 17 IU/L (ref 0–40)
Alkaline Phosphatase: 83 IU/L (ref 39–117)
BILIRUBIN TOTAL: 0.4 mg/dL (ref 0.0–1.2)
BUN: 13 mg/dL (ref 6–24)
Bilirubin, Direct: 0.1 mg/dL (ref 0.00–0.40)
CALCIUM: 9.3 mg/dL (ref 8.7–10.2)
CHLORIDE: 102 mmol/L (ref 96–106)
CO2: 22 mmol/L (ref 20–29)
CREATININE: 0.95 mg/dL (ref 0.76–1.27)
GFR calc Af Amer: 102 mL/min/{1.73_m2} (ref >59)
GFR, EST NON AFRICAN AMERICAN: 88 mL/min/{1.73_m2} (ref >59)
GLUCOSE: 80 mg/dL (ref 65–99)
POTASSIUM: 4.1 mmol/L (ref 3.5–5.2)
Sodium: 142 mmol/L (ref 134–144)
Total Protein: 7.6 g/dL (ref 6.0–8.5)

## 2017-06-12 LAB — URIC ACID: Uric Acid: 7.8 mg/dL (ref 3.7–8.6)

## 2017-06-12 LAB — LIPID PANEL
CHOL/HDL RATIO: 2.5 ratio (ref 0.0–5.0)
CHOLESTEROL TOTAL: 184 mg/dL (ref 100–199)
HDL: 75 mg/dL (ref >39)
LDL Calculated: 92 mg/dL (ref 0–99)
TRIGLYCERIDES: 83 mg/dL (ref 0–149)
VLDL Cholesterol Cal: 17 mg/dL (ref 5–40)

## 2017-06-14 ENCOUNTER — Other Ambulatory Visit: Payer: Self-pay | Admitting: Family Medicine

## 2017-06-15 ENCOUNTER — Telehealth: Payer: Self-pay

## 2017-06-15 NOTE — Telephone Encounter (Signed)
CMA call regarding lab results   Patient verify DOB  Patient was aware and understood  

## 2017-06-15 NOTE — Telephone Encounter (Signed)
-----   Message from Alfonse Spruce, [place] sent at 06/14/2017  6:19 PM EDT ----- Kidney function normal Liver function normal Cholesterol levels normal on current dosage of atorvastatin.  Uric acid test is normal When uric acid is high it can cause gout.  Follow up in 3 months.

## 2017-09-10 ENCOUNTER — Telehealth: Payer: Self-pay | Admitting: Family Medicine

## 2017-09-10 ENCOUNTER — Other Ambulatory Visit: Payer: Self-pay | Admitting: Family Medicine

## 2017-09-10 DIAGNOSIS — I10 Essential (primary) hypertension: Secondary | ICD-10-CM

## 2017-09-10 DIAGNOSIS — J4540 Moderate persistent asthma, uncomplicated: Secondary | ICD-10-CM

## 2017-09-10 MED ORDER — HYDROCHLOROTHIAZIDE 25 MG PO TABS: 25.0000 mg | ORAL_TABLET | Freq: Every day | ORAL | 0 refills | Status: DC

## 2017-09-10 MED ORDER — LOSARTAN POTASSIUM 100 MG PO TABS: 100.0000 mg | ORAL_TABLET | Freq: Every day | ORAL | 0 refills | Status: DC

## 2017-09-10 MED ORDER — AMLODIPINE BESYLATE 10 MG PO TABS: 10.0000 mg | ORAL_TABLET | Freq: Every day | ORAL | 0 refills | Status: DC

## 2017-09-10 NOTE — Telephone Encounter (Signed)
Called patient regarding medication request. He denies needing albuterol refill at this time. He reports needing refills on antihypertensive medications. Medications will be sent to [place] on file.

## 2017-09-10 NOTE — Telephone Encounter (Signed)
Called patient he denies need albuterol refill at this time. BP meds sent to [place] on file.

## 2017-09-10 NOTE — Telephone Encounter (Signed)
Pt came in to request a refill on his  -Blood pressure medication -albuterol (VENTOLIN HFA) 108 (90 Base) MCG/ACT inhaler  please follow up

## 2017-09-10 NOTE — Telephone Encounter (Signed)
Pt came in to request a refill on his  -Blood pressure medication -albuterol (VENTOLIN HFA) 108 (90 Base) MCG/ACT inhaler  please advice?

## 2017-09-16 ENCOUNTER — Ambulatory Visit: Payer: BLUE CROSS/BLUE SHIELD | Attending: Family Medicine | Admitting: Family Medicine

## 2017-09-16 ENCOUNTER — Encounter: Payer: Self-pay | Admitting: Family Medicine

## 2017-09-16 VITALS — BP 119/66 | HR 81 | Temp 97.9°F | Resp 18 | Ht 77.0 in | Wt 279.8 lb

## 2017-09-16 DIAGNOSIS — Z79899 Other long term (current) drug therapy: Secondary | ICD-10-CM | POA: Insufficient documentation

## 2017-09-16 DIAGNOSIS — E785 Hyperlipidemia, unspecified: Secondary | ICD-10-CM | POA: Insufficient documentation

## 2017-09-16 DIAGNOSIS — Z23 Encounter for immunization: Secondary | ICD-10-CM

## 2017-09-16 DIAGNOSIS — I10 Essential (primary) hypertension: Secondary | ICD-10-CM

## 2017-09-16 DIAGNOSIS — Z7951 Long term (current) use of inhaled steroids: Secondary | ICD-10-CM | POA: Insufficient documentation

## 2017-09-16 DIAGNOSIS — L308 Other specified dermatitis: Secondary | ICD-10-CM

## 2017-09-16 DIAGNOSIS — L299 Pruritus, unspecified: Secondary | ICD-10-CM | POA: Diagnosis not present

## 2017-09-16 DIAGNOSIS — J4540 Moderate persistent asthma, uncomplicated: Secondary | ICD-10-CM

## 2017-09-16 DIAGNOSIS — L989 Disorder of the skin and subcutaneous tissue, unspecified: Secondary | ICD-10-CM

## 2017-09-16 MED ORDER — ALBUTEROL SULFATE HFA 108 (90 BASE) MCG/ACT IN AERS: 2.0000 | INHALATION_SPRAY | Freq: Four times a day (QID) | RESPIRATORY_TRACT | 3 refills | Status: DC | PRN

## 2017-09-16 MED ORDER — FLUTICASONE-SALMETEROL 250-50 MCG/DOSE IN AEPB: 1.0000 | INHALATION_SPRAY | Freq: Two times a day (BID) | RESPIRATORY_TRACT | 2 refills | Status: DC

## 2017-09-16 MED ORDER — HYDROCHLOROTHIAZIDE 25 MG PO TABS: 25.0000 mg | ORAL_TABLET | Freq: Every day | ORAL | 3 refills | Status: DC

## 2017-09-16 MED ORDER — MONTELUKAST SODIUM 10 MG PO TABS: 10.0000 mg | ORAL_TABLET | Freq: Every day | ORAL | 11 refills | Status: DC

## 2017-09-16 MED ORDER — TRIAMCINOLONE ACETONIDE 0.1 % EX CREA: 1.0000 "application " | TOPICAL_CREAM | Freq: Two times a day (BID) | CUTANEOUS | 0 refills | Status: DC

## 2017-09-16 MED ORDER — AMLODIPINE BESYLATE 10 MG PO TABS: 10.0000 mg | ORAL_TABLET | Freq: Every day | ORAL | 3 refills | Status: DC

## 2017-09-16 MED ORDER — LOSARTAN POTASSIUM 100 MG PO TABS: 100.0000 mg | ORAL_TABLET | Freq: Every day | ORAL | 3 refills | Status: DC

## 2017-09-16 NOTE — Patient Instructions (Signed)
Asthma Attack Prevention, Adult Although you may not be able to control the fact that you have asthma, you can take actions to prevent episodes of asthma (asthma attacks). These actions include:  Creating a written plan for managing and treating your asthma attacks (asthma action plan).  Monitoring your asthma.  Avoiding things that can irritate your airways or make your asthma symptoms worse (asthma triggers).  Taking your medicines as directed.  Acting quickly if you have signs or symptoms of an asthma attack.  What are some ways to prevent an asthma attack? Create a plan Work with your health care provider to create an asthma action plan. This plan should include:  A list of your asthma triggers and how to avoid them.  A list of symptoms that you experience during an asthma attack.  Information about when to take medicine and how much medicine to take.  Information to help you understand your peak flow measurements.  Contact information for your health care providers.  Daily actions that you can take to control asthma.  Monitor your asthma  To monitor your asthma:  Use your peak flow meter every morning and every evening for 2-3 weeks. Record the results in a journal. A drop in your peak flow numbers on one or more days may mean that you are starting to have an asthma attack, even if you are not having symptoms.  When you have asthma symptoms, write them down in a journal.  Avoid asthma triggers  Work with your health care provider to find out what your asthma triggers are. This can be done by:  Being tested for allergies.  Keeping a journal that notes when asthma attacks occur and what may have contributed to them.  Asking your health care provider whether other medical conditions make your asthma worse.  Common asthma triggers include:  Dust.  Smoke. This includes campfire smoke and secondhand smoke from tobacco products.  Pet dander.  Trees, grasses or  pollens.  Very cold, dry, or humid air.  Mold.  Foods that contain high amounts of sulfites.  Strong smells.  Engine exhaust and air pollution.  Aerosol sprays and fumes from household cleaners.  Household pests and their droppings, including dust mites and cockroaches.  Certain medicines, including NSAIDs.  Once you have determined your asthma triggers, take steps to avoid them. Depending on your triggers, you may be able to reduce the chance of an asthma attack by:  Keeping your home clean. Have someone dust and vacuum your home for you 1 or 2 times a week. If possible, have them use a high-efficiency particulate arrestance (HEPA) vacuum.  Washing your sheets weekly in hot water.  Using allergy-proof mattress covers and casings on your bed.  Keeping pets out of your home.  Taking care of mold and water problems in your home.  Avoiding areas where people smoke.  Avoiding using strong perfumes or odor sprays.  Avoid spending a lot of time outdoors when pollen counts are high and on very windy days.  Talking with your health care provider before stopping or starting any new medicines.  Medicines Take over-the-counter and prescription medicines only as told by your health care provider. Many asthma attacks can be prevented by carefully following your medicine schedule. Taking your medicines correctly is especially important when you cannot avoid certain asthma triggers. Even if you are doing well, do not stop taking your medicine and do not take less medicine. Act quickly If an asthma attack happens, acting quickly   it is and how long it lasts. Take these actions:  Pay attention to your symptoms. If you are coughing, wheezing, or having difficulty breathing, do not wait to see if your symptoms go away on their own. Follow your asthma action plan.  If you have followed your asthma action plan and your symptoms are not improving, call your health care  provider or seek immediate medical care at the nearest hospital. It is important to write down how often you need to use your fast-acting rescue inhaler. You can track how often you use an inhaler in your journal. If you are using your rescue inhaler more often, it may mean that your asthma is not under control. Adjusting your asthma treatment plan may help you to prevent future asthma attacks and help you to gain better control of your condition. How can I prevent an asthma attack when I exercise?   Exercise is a common asthma trigger. To prevent asthma attacks during exercise:  Follow advice from your health care provider about whether you should use your fast-acting inhaler before exercising. Many people with asthma experience exercise-induced bronchoconstriction (EIB). This condition often worsens during vigorous exercise in cold, humid, or dry environments. Usually, people with EIB can stay very active by using a fast-acting inhaler before exercising.  Avoid exercising outdoors in very cold or humid weather.  Avoid exercising outdoors when pollen counts are high.  Warm up and cool down when exercising.  Stop exercising right away if asthma symptoms start. Consider taking part in exercises that are less likely to cause asthma symptoms such as:  Indoor swimming.  Biking.  Walking.  Hiking.  Playing football. This information is not intended to replace advice given to you by your health care provider. Make sure you discuss any questions you have with your health care provider. Document Released: 09/09/2009 Document Revised: 05/22/2016 Document Reviewed: 03/07/2016 Elsevier Interactive Patient Education  2017 [employer] Inc[employer]  

## 2017-09-16 NOTE — Progress Notes (Signed)
Patient is here for f/up HTN 

## 2017-09-16 NOTE — Progress Notes (Signed)
Subjective:  Patient ID: Edward Wilson, male    DOB: 1961-04-12  Age: 56 y.o. MRN: [id number]  CC: Hypertension   HPI Edward Wilson presents for follow up. History of HTN and asthma. He is not exercising and is not adherent to low salt diet.  He does not shape BP at home. Cardiac symptoms none. Patient denies chest pain, chest pressure/discomfort, claudication, dyspnea, lower extremity edema, near-syncope, palpitations and syncope.  Cardiovascular risk factors: advanced age (older than 30 for men, 46 for women), dyslipidemia, hypertension, male gender and sedentary lifestyle. Use of agents associated with hypertension: none. History of target organ damage: none.  History of asthma. He reports any increased inhaler use. He denies symptoms of  chest tightness, dyspnea, productive cough. He does report increase of wheezing upon waking up in the a.m. Asthma triggers include cold temperature.   History of generalized rash. Rash started several years ago. Discomfort associated with rash: is pruritic.  Associated symptoms: none. Denies: fever. Patient has had previous evaluation of rash.  Previous treatment included triamcinolone cream. He reports not using. Patient has not had contacts with similar rash. Patient has not identified precipitant. Patient has not had new exposures (soaps, lotions, laundry detergents, foods, medications, plants, insects or animals.).  History of previous dermatology referral patient reports not following up.   Outpatient Medications Prior to Visit  Medication Sig Dispense Refill  . atorvastatin (LIPITOR) 40 MG tablet Take 1 tablet (40 mg total) by mouth daily. 30 tablet 11  . naproxen (NAPROSYN) 500 MG tablet TAKE ONE TABLET BY MOUTH WITH MEALS TWICE A DAY FOR 5 DAYS. THEN TAKE ONE TABLET BY MOUTH WITH MEAL AS NEEDED. 40 tablet 0  . omeprazole (PRILOSEC) 40 MG capsule Take 1 capsule (40 mg total) by mouth daily. (Patient taking differently: Take 40 mg by mouth as needed. ) 30  capsule 3  . albuterol (VENTOLIN HFA) 108 (90 Base) MCG/ACT inhaler Inhale 2 puffs into the lungs every 6 (six) hours as needed for wheezing or shortness of breath. 18 g 5  . amLODipine (NORVASC) 10 MG tablet Take 1 tablet (10 mg total) by mouth daily. 30 tablet 0  . cetirizine (ZYRTEC ALLERGY) 10 MG tablet Take 1 tablet (10 mg total) by mouth daily. 30 tablet 3  . hydrochlorothiazide (HYDRODIURIL) 25 MG tablet Take 1 tablet (25 mg total) by mouth daily. 30 tablet 0  . losartan (COZAAR) 100 MG tablet Take 1 tablet (100 mg total) by mouth daily. 30 tablet 0  . montelukast (SINGULAIR) 10 MG tablet Take 1 tablet (10 mg total) by mouth at bedtime. 30 tablet 3  . triamcinolone cream (KENALOG) 0.1 % Apply 1 application topically 2 (two) times daily. For 2 weeks 30 g 0   No facility-administered medications prior to visit.     Review of Systems  Constitutional: Negative.   Respiratory: Positive for wheezing.   Cardiovascular: Negative.   Skin: Positive for rash.    Objective:  BP 119/66 (BP Location: Left Arm, Patient Position: Sitting, Cuff Size: Normal)   Pulse 81   Temp 97.9 F (36.6 C) (Oral)   Resp 18   Ht 6\' 5"  (1.956 m)   Wt 279 lb 12.8 oz (126.9 kg)   SpO2 97%   BMI 33.18 kg/m   BP/Weight 09/16/2017 06/11/2017 5/36/6440  Systolic BP 347 425 956  Diastolic BP 66 80 67  Wt. (Lbs) 279.8 281.2 277.6  BMI 33.18 33.35 32.92   Physical Exam  Constitutional: He is well-developed, well-nourished,  and in no distress.  HENT:  Head: Normocephalic and atraumatic.  Right Ear: External ear normal.  Left Ear: External ear normal.  Nose: Nose normal.  Mouth/Throat: Oropharynx is clear and moist.  Eyes: Conjunctivae are normal.  Cardiovascular: Normal rate, regular rhythm, normal heart sounds and intact distal pulses.  Pulmonary/Chest: Effort normal and breath sounds normal. He has no wheezes.  Abdominal: Soft. Bowel sounds are normal.  Skin: Rash (BUE/BLE extremities) noted. Rash is  nodular.  Skin peeling is present  Nursing note and vitals reviewed.   Assessment & Plan:   1. Essential hypertension, benign  - amLODipine (NORVASC) 10 MG tablet; Take 1 tablet (10 mg total) by mouth daily.  Dispense: 30 tablet; Refill: 3 - hydrochlorothiazide (HYDRODIURIL) 25 MG tablet; Take 1 tablet (25 mg total) by mouth daily.  Dispense: 30 tablet; Refill: 3 - losartan (COZAAR) 100 MG tablet; Take 1 tablet (100 mg total) by mouth daily.  Dispense: 30 tablet; Refill: 3  2. Moderate persistent chronic asthma without complication  - albuterol (VENTOLIN HFA) 108 (90 Base) MCG/ACT inhaler; Inhale 2 puffs into the lungs every 6 (six) hours as needed for wheezing or shortness of breath.  Dispense: 18 g; Refill: 3 - montelukast (SINGULAIR) 10 MG tablet; Take 1 tablet (10 mg total) by mouth at bedtime.  Dispense: 30 tablet; Refill: 11 - Fluticasone-Salmeterol (ADVAIR DISKUS) 250-50 MCG/DOSE AEPB; Inhale 1 puff into the lungs 2 (two) times daily.  Dispense: 60 each; Refill: 2  3. Skin lesions  - Ambulatory referral to Dermatology  4. Pruritic dermatitis  - triamcinolone cream (KENALOG) 0.1 %; Apply 1 application topically 2 (two) times daily. For 2 weeks  Dispense: 30 g; Refill: 0  5. Needs flu shot  - Flu Vaccine QUAD 6+ mos PF IM (Fluarix Quad PF)     Follow-up: Return in about 3 months (around 12/15/2017), or if symptoms worsen or fail to improve, for HTN / Asthma.   Alfonse Spruce FNP

## 2017-09-29 ENCOUNTER — Ambulatory Visit (HOSPITAL_COMMUNITY)
Admission: RE | Admit: 2017-09-29 | Discharge: 2017-09-29 | Disposition: A | Discharge status: Home / Self Care | Payer: BLUE CROSS/BLUE SHIELD | Source: Ambulatory Visit | Attending: Family Medicine | Admitting: Family Medicine

## 2017-09-29 ENCOUNTER — Ambulatory Visit: Payer: BLUE CROSS/BLUE SHIELD | Attending: Family Medicine | Admitting: Family Medicine

## 2017-09-29 ENCOUNTER — Encounter: Payer: Self-pay | Admitting: Family Medicine

## 2017-09-29 VITALS — BP 171/78 | HR 73 | Temp 98.1°F | Ht 77.0 in | Wt 282.4 lb

## 2017-09-29 DIAGNOSIS — Z87828 Personal history of other (healed) physical injury and trauma: Secondary | ICD-10-CM | POA: Diagnosis not present

## 2017-09-29 DIAGNOSIS — M25511 Pain in right shoulder: Secondary | ICD-10-CM | POA: Insufficient documentation

## 2017-09-29 DIAGNOSIS — M25611 Stiffness of right shoulder, not elsewhere classified: Secondary | ICD-10-CM | POA: Insufficient documentation

## 2017-09-29 DIAGNOSIS — Z79899 Other long term (current) drug therapy: Secondary | ICD-10-CM | POA: Diagnosis not present

## 2017-09-29 DIAGNOSIS — M19011 Primary osteoarthritis, right shoulder: Secondary | ICD-10-CM | POA: Diagnosis not present

## 2017-09-29 MED ORDER — NAPROXEN 500 MG PO TABS: ORAL_TABLET | ORAL | 0 refills | Status: DC

## 2017-09-29 MED FILL — NAPROXEN 500 MG TABLET: 500 | 30 days supply | Qty: 40 | Fill #0

## 2017-09-29 NOTE — Progress Notes (Signed)
Subjective:  Patient ID: Edward Wilson, male    DOB: 03/07/1961  Age: 56 y.o. MRN: [id number]  CC: Shoulder Pain   HPI Sevan Mcbroom presents for complains of mild arthralgias for which has been present for 1 year to the right shoulder. He reports history of heavy lifting of furniture 1 week ago. He reports pushing against an item with his right hand with heavy force. Afterwards he began to experience pain and limited ROM in his right shoulder. He reports taking Naproxen two tablets twice daily for pain with some relief of pain symptoms.   Outpatient Medications Prior to Visit  Medication Sig Dispense Refill  . albuterol (VENTOLIN HFA) 108 (90 Base) MCG/ACT inhaler Inhale 2 puffs into the lungs every 6 (six) hours as needed for wheezing or shortness of breath. 18 g 3  . amLODipine (NORVASC) 10 MG tablet Take 1 tablet (10 mg total) by mouth daily. 30 tablet 3  . atorvastatin (LIPITOR) 40 MG tablet Take 1 tablet (40 mg total) by mouth daily. 30 tablet 11  . hydrochlorothiazide (HYDRODIURIL) 25 MG tablet Take 1 tablet (25 mg total) by mouth daily. 30 tablet 3  . losartan (COZAAR) 100 MG tablet Take 1 tablet (100 mg total) by mouth daily. 30 tablet 3  . naproxen (NAPROSYN) 500 MG tablet TAKE ONE TABLET BY MOUTH WITH MEALS TWICE A DAY FOR 5 DAYS. THEN TAKE ONE TABLET BY MOUTH WITH MEAL AS NEEDED. 40 tablet 0  . Fluticasone-Salmeterol (ADVAIR DISKUS) 250-50 MCG/DOSE AEPB Inhale 1 puff into the lungs 2 (two) times daily. (Patient not taking: Reported on 09/29/2017) 60 each 2  . montelukast (SINGULAIR) 10 MG tablet Take 1 tablet (10 mg total) by mouth at bedtime. (Patient not taking: Reported on 09/29/2017) 30 tablet 11  . omeprazole (PRILOSEC) 40 MG capsule Take 1 capsule (40 mg total) by mouth daily. (Patient not taking: Reported on 09/29/2017) 30 capsule 3  . triamcinolone cream (KENALOG) 0.1 % Apply 1 application topically 2 (two) times daily. For 2 weeks (Patient not taking: Reported on 09/29/2017)  30 g 0   No facility-administered medications prior to visit.     ROS Review of Systems  Constitutional: Negative.   Respiratory: Negative.   Cardiovascular: Negative.   Musculoskeletal: Positive for arthralgias and myalgias.   Objective:  BP (!) 171/78   Pulse 73   Temp 98.1 F (36.7 C) (Oral)   Ht 6\' 5"  (1.956 m)   Wt 282 lb 6.4 oz (128.1 kg)   SpO2 96%   BMI 33.49 kg/m   BP/Weight 09/29/2017 44/10/270 02/05/6643  Systolic BP 034 742 595  Diastolic BP 78 66 80  Wt. (Lbs) 282.4 279.8 281.2  BMI 33.49 33.18 33.35   Physical Exam  Neck: No JVD present.  Cardiovascular: Normal rate, regular rhythm, normal heart sounds and intact distal pulses.  Pulmonary/Chest: Effort normal and breath sounds normal.  Musculoskeletal:       Right shoulder: He exhibits decreased range of motion (unable to abduct shoulder greater than 50 degrees), bony tenderness, pain and decreased strength (4/5 motor strength).  Skin: Skin is warm and dry.  Nursing note and vitals reviewed.  Depression screen [facility] 2/9 09/29/2017 09/16/2017 06/11/2017  Decreased Interest 0 0 0  Down, Depressed, Hopeless 0 0 0  PHQ - 2 Score 0 0 0  Altered sleeping 0 0 0  Tired, decreased energy 0 1 0  Change in appetite 0 1 0  Feeling bad or failure about yourself  0 0  0  Trouble concentrating 0 1 0  Moving slowly or fidgety/restless 0 0 0  Suicidal thoughts 0 0 0  PHQ-9 Score 0 3 0    GAD 7 : Generalized Anxiety Score 09/29/2017 09/16/2017 06/11/2017 02/01/2017  Nervous, Anxious, on Edge 0 0 0 0  Control/stop worrying 0 0 0 0  Worry too much - different things 0 0 0 0  Trouble relaxing 0 0 0 0  Restless 0 0 0 0  Easily annoyed or irritable 0 0 0 1  Afraid - awful might happen 0 0 0 0  Total GAD 7 Score 0 0 0 1     Assessment & Plan:   1. Acute pain of right shoulder  - DG Shoulder Right; Future - naproxen (NAPROSYN) 500 MG tablet; TAKE ONE TABLET BY MOUTH WITH MEALS TWICE A DAY FOR 5 DAYS. THEN TAKE ONE  TABLET BY MOUTH WITH MEAL AS NEEDED.  Dispense: 40 tablet; Refill: 0  2. Decreased ROM of right shoulder  - DG Shoulder Right; Future - Ambulatory referral to Physical Therapy  3. History of injury  - DG Shoulder Right; Future     Follow-up: Return if symptoms worsen or fail to improve.   Alfonse Spruce FNP

## 2017-09-29 NOTE — Patient Instructions (Signed)
Shoulder Pain Many things can cause shoulder pain, including:  An injury.  Moving the arm in the same way again and again (overuse).  Joint pain (arthritis).  Follow these instructions at home: Take these actions to help with your pain:  Squeeze a soft ball or a foam pad as much as you can. This helps to prevent swelling. It also makes the arm stronger.  Take over-the-counter and prescription medicines only as told by your doctor.  If told, put ice on the area: ? Put ice in a plastic bag. ? Place a towel between your skin and the bag. ? Leave the ice on for 20 minutes, 2-3 times per day. Stop putting on ice if it does not help with the pain.  If you were given a shoulder sling or immobilizer: ? Wear it as told. ? Remove it to shower or bathe. ? Move your arm as little as possible. ? Keep your hand moving. This helps prevent swelling.  Contact a doctor if:  Your pain gets worse.  Medicine does not help your pain.  You have new pain in your arm, hand, or fingers. Get help right away if:  Your arm, hand, or fingers: ? Tingle. ? Are numb. ? Are swollen. ? Are painful. ? Turn white or blue. This information is not intended to replace advice given to you by your health care provider. Make sure you discuss any questions you have with your health care provider. Document Released: 03/09/2008 Document Revised: 05/17/2016 Document Reviewed: 01/14/2015 Elsevier Interactive Patient Education  2018 [employer]vier Inc.  

## 2017-10-04 ENCOUNTER — Other Ambulatory Visit: Payer: Self-pay | Admitting: Family Medicine

## 2017-10-06 ENCOUNTER — Telehealth: Payer: Self-pay | Admitting: *Deleted

## 2017-10-06 ENCOUNTER — Other Ambulatory Visit: Payer: Self-pay | Admitting: Family Medicine

## 2017-10-06 DIAGNOSIS — M19011 Primary osteoarthritis, right shoulder: Secondary | ICD-10-CM

## 2017-10-06 DIAGNOSIS — M7581 Other shoulder lesions, right shoulder: Secondary | ICD-10-CM

## 2017-10-06 NOTE — Telephone Encounter (Signed)
Please submit referral information for patient to orthopedics. Pt was in office today requesting rush on referral.

## 2017-10-12 ENCOUNTER — Telehealth (INDEPENDENT_AMBULATORY_CARE_PROVIDER_SITE_OTHER): Payer: Self-pay | Admitting: *Deleted

## 2017-10-12 NOTE — Telephone Encounter (Signed)
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 You will be referred to orthopedics.

## 2017-10-12 NOTE — Telephone Encounter (Signed)
Patient verified DOB Patient has an appointment with [facility] on 10/18/17. No further questions.

## 2017-10-18 ENCOUNTER — Encounter (INDEPENDENT_AMBULATORY_CARE_PROVIDER_SITE_OTHER): Payer: Self-pay | Admitting: Orthopaedic Surgery

## 2017-10-18 ENCOUNTER — Ambulatory Visit (INDEPENDENT_AMBULATORY_CARE_PROVIDER_SITE_OTHER): Payer: Self-pay | Admitting: Orthopaedic Surgery

## 2017-10-18 DIAGNOSIS — M25511 Pain in right shoulder: Secondary | ICD-10-CM

## 2017-10-18 DIAGNOSIS — G8929 Other chronic pain: Secondary | ICD-10-CM

## 2017-10-18 MED ORDER — NAPROXEN 500 MG PO TABS: 500.0000 mg | ORAL_TABLET | Freq: Two times a day (BID) | ORAL | 3 refills | Status: DC

## 2017-10-18 NOTE — Addendum Note (Signed)
Addended by: Precious Bard on: 10/18/2017 04:05 PM   Modules accepted: Orders

## 2017-10-18 NOTE — Progress Notes (Signed)
Office Visit Note   Patient: Edward Wilson           Date of Birth: 07-15-1961           MRN: [id number] Visit Date: 10/18/2017              Requested by: Alfonse Spruce, [place], [place] [id number] PCP: Alfonse Spruce, FNP   Assessment & Plan: Visit Diagnoses:  1. 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  Recommend MRI to evaluate for this.  Return in about a week and half to review the MRI.  Prescription for naproxen.  Follow-Up Instructions: Return in about 10 days (around 10/28/2017).   Orders:  No orders of the defined types were placed in this encounter.  Meds ordered this encounter  Medications  . naproxen (NAPROSYN) 500 MG tablet    Sig: Take 1 tablet (500 mg total) by mouth 2 (two) times daily with a meal.    Dispense:  30 tablet    Refill:  3      Procedures: No procedures performed   Clinical Data: No additional findings.   Subjective: Chief Complaint  Patient presents with  . Right Shoulder - Pain    Patient is a 57 year old gentleman comes in with 2-week history of acute worsening right shoulder pain that has been bothering him chronically for the last 2 years.  He is always had some difficulty with weakness with elevation of the arm above shoulder.  He jammed his right arm and shoulder and has had worsening.  He denies any numbness and tingling.    Review of Systems  Constitutional: Negative.   All other systems reviewed and are negative.    Objective: Vital Signs: There were no vitals taken for this visit.  Physical Exam  Constitutional: He is oriented to person, place, and time. He appears well-developed and well-nourished.  HENT:  Head: Normocephalic and atraumatic.  Eyes: Pupils are equal, round, and reactive to light.  Neck: Neck supple.  Pulmonary/Chest: Effort normal.  Abdominal: Soft.  Musculoskeletal: Normal  range of motion.  Neurological: He is alert and oriented to person, place, and time.  Skin: Skin is warm.  Psychiatric: He has a normal mood and affect. His behavior is normal. Judgment and thought content normal.  Nursing note and vitals reviewed.   Ortho Exam Right shoulder exam shows a positive drop arm test.  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  Rotation of the humeral head is asymptomatic Specialty Comments:  No specialty comments available.  Imaging: No results found.   PMFS History: Patient Active Problem List   Diagnosis Date Noted  . Hand dermatitis 02/01/2017  . Pure hypercholesterolemia 09/18/2016  . Asthma, chronic 08/07/2016  . History of syphilis 10/14/2015  . Essential hypertension, benign 02/12/2015  . Arthralgia 02/12/2015  . Tobacco use disorder 02/12/2015  . Alcohol use 02/12/2015   Past Medical History:  Diagnosis Date  . Asthma   . Hypertension     Family History  Problem Relation Age of Onset  . Heart failure Mother        heart attack  . Heart attack Mother   . Colon cancer Neg Hx     History reviewed. No pertinent surgical history. Social History   Occupational History  . Not on file  Tobacco Use  . Smoking status: Former Smoker    Packs/day: 0.10  Types: Cigarettes    Last attempt to quit: 01/25/2017    Years since quitting: 0.7  . Smokeless tobacco: Never Used  . Tobacco comment: Smoking 1-2 cigs 3 days per week  Substance and Sexual Activity  . Alcohol use: Yes    Comment: 2, 40 oz of beer per week   . Drug use: No  . Sexual activity: Not on file

## 2017-10-27 ENCOUNTER — Ambulatory Visit
Admission: RE | Admit: 2017-10-27 | Discharge: 2017-10-27 | Disposition: A | Discharge status: Home / Self Care | Payer: BLUE CROSS/BLUE SHIELD | Source: Ambulatory Visit | Attending: Orthopaedic Surgery | Admitting: Orthopaedic Surgery

## 2017-10-27 DIAGNOSIS — G8929 Other chronic pain: Secondary | ICD-10-CM

## 2017-10-27 DIAGNOSIS — M25511 Pain in right shoulder: Principal | ICD-10-CM

## 2017-11-01 ENCOUNTER — Encounter (INDEPENDENT_AMBULATORY_CARE_PROVIDER_SITE_OTHER): Payer: Self-pay | Admitting: Orthopaedic Surgery

## 2017-11-01 ENCOUNTER — Ambulatory Visit (INDEPENDENT_AMBULATORY_CARE_PROVIDER_SITE_OTHER): Payer: BLUE CROSS/BLUE SHIELD | Admitting: Orthopaedic Surgery

## 2017-11-01 DIAGNOSIS — M25511 Pain in right shoulder: Secondary | ICD-10-CM

## 2017-11-01 DIAGNOSIS — S46811A Strain of other muscles, fascia and tendons at shoulder and upper arm level, right arm, initial encounter: Secondary | ICD-10-CM | POA: Diagnosis not present

## 2017-11-01 DIAGNOSIS — G8929 Other chronic pain: Secondary | ICD-10-CM

## 2017-11-01 DIAGNOSIS — IMO0001 Reserved for inherently not codable concepts without codable children: Secondary | ICD-10-CM

## 2017-11-01 MED ORDER — NAPROXEN 500 MG PO TABS: 500.0000 mg | ORAL_TABLET | Freq: Two times a day (BID) | ORAL | 3 refills | Status: DC

## 2017-11-01 NOTE — Progress Notes (Signed)
Office Visit Note   Patient: Edward Wilson           Date of Birth: Mar 13, 1961           MRN: [id number] Visit Date: 11/01/2017              Requested by: Alfonse Spruce, [place], [place] [address] PCP: Alfonse Spruce, FNP   Assessment & Plan: Visit Diagnoses:  1. Chronic right shoulder pain   2. Supraspinatus tendon tear, right, initial encounter   3. Infraspinatus tendon tear, right, initial encounter     Plan: impression is 57 year old gentleman with Full-thickness retracted supraspinatus and infraspinatus tears I think there is likely.  A chronic component to this as he has a slightly superiorly migrated humeral head with some mild muscle atrophy.  I think he acutely tore his rotator cuff and given his young age I recommend at least attempting to repair his rotator cuff we discussed the risks and benefits alternatives to surgery including incomplete relief of pain and failure to achieve desired results as well as infection.  He understands and will let us know.  We also discussed nonsurgical treatment with pain control and physical therapy in hopes of hopefully compensating for his deficient rotator cuff. Total face to face encounter time was greater than 25 minutes and over half of this time was spent in counseling and/or coordination of care.  Follow-Up Instructions: Return if symptoms worsen or fail to improve.   Orders:  No orders of the defined types were placed in this encounter.  Meds ordered this encounter  Medications  . naproxen (NAPROSYN) 500 MG tablet    Sig: Take 1 tablet (500 mg total) by mouth 2 (two) times daily with a meal.    Dispense:  60 tablet    Refill:  3      Procedures: No procedures performed   Clinical Data: No additional findings.   Subjective: Chief Complaint  Patient presents with  . Right Shoulder - Pain, Follow-up    Patient comes in today to review his right shoulder MRI still has significant pain and  dysfunction.    Review of Systems   Objective: Vital Signs: There were no vitals taken for this visit.  Physical Exam  Ortho Exam Right shoulder exam shows significant weakness with active use of his supraspinatus and infraspinatus.  Passive range of motion is normal.   Specialty Comments:  No specialty comments available.  Imaging: No results found.   PMFS History: Patient Active Problem List   Diagnosis Date Noted  . Supraspinatus tendon tear, right, initial encounter 11/01/2017  . Infraspinatus tendon tear, right, initial encounter 11/01/2017  . Hand dermatitis 02/01/2017  . Pure hypercholesterolemia 09/18/2016  . Asthma, chronic 08/07/2016  . History of syphilis 10/14/2015  . Essential hypertension, benign 02/12/2015  . Arthralgia 02/12/2015  . Tobacco use disorder 02/12/2015  . Alcohol use 02/12/2015   Past Medical History:  Diagnosis Date  . Asthma   . Hypertension     Family History  Problem Relation Age of Onset  . Heart failure Mother        heart attack  . Heart attack Mother   . Colon cancer Neg Hx     History reviewed. No pertinent surgical history. Social History   Occupational History  . Not on file  Tobacco Use  . Smoking status: Former Smoker    Packs/day: 0.10    Types: Cigarettes    Last attempt to  quit: 01/25/2017    Years since quitting: 0.7  . Smokeless tobacco: Never Used  . Tobacco comment: Smoking 1-2 cigs 3 days per week  Substance and Sexual Activity  . Alcohol use: Yes    Comment: 2, 40 oz of beer per week   . Drug use: No  . Sexual activity: Not on file

## 2017-11-02 NOTE — Telephone Encounter (Signed)
Patient had an appointment at [facility]  10-18-17 @ 3pm

## 2017-11-08 ENCOUNTER — Telehealth (INDEPENDENT_AMBULATORY_CARE_PROVIDER_SITE_OTHER): Payer: Self-pay

## 2017-11-08 NOTE — Telephone Encounter (Signed)
I left blue sheet in door

## 2017-11-08 NOTE — Telephone Encounter (Signed)
[phone] Left voice mail wanting to schedule shoulder surgery.

## 2017-11-17 NOTE — Telephone Encounter (Signed)
Patient is requesting a call from Dr. Erlinda Hong personally to discuss his surgery as far as his work is concerned. He might have to postpone it depending on what Dr. Erlinda Hong says. Please advise # [phone]

## 2017-11-18 NOTE — Telephone Encounter (Signed)
See below

## 2017-11-19 NOTE — Telephone Encounter (Signed)
Spoke with patient about this and he's going to cancel surgery and f/u with me in 6 weeks.

## 2017-11-22 NOTE — Telephone Encounter (Signed)
Edward Wilson see below.

## 2017-12-03 ENCOUNTER — Inpatient Hospital Stay (INDEPENDENT_AMBULATORY_CARE_PROVIDER_SITE_OTHER): Payer: BLUE CROSS/BLUE SHIELD | Admitting: Orthopaedic Surgery

## 2017-12-08 ENCOUNTER — Telehealth (INDEPENDENT_AMBULATORY_CARE_PROVIDER_SITE_OTHER): Payer: Self-pay | Admitting: Orthopaedic Surgery

## 2017-12-08 ENCOUNTER — Other Ambulatory Visit (INDEPENDENT_AMBULATORY_CARE_PROVIDER_SITE_OTHER): Payer: Self-pay

## 2017-12-08 MED ORDER — NAPROXEN 500 MG PO TABS: 500.0000 mg | ORAL_TABLET | Freq: Two times a day (BID) | ORAL | 0 refills | Status: DC | PRN

## 2017-12-08 MED FILL — HYDROCHLOROTHIAZIDE 25 MG T: 25 | 30 days supply | Qty: 30 | Fill #0

## 2017-12-08 MED FILL — LOSARTAN POTASSIUM 100 MG T: 100 | 30 days supply | Qty: 30 | Fill #0

## 2017-12-08 MED FILL — AMLODIPINE BESYLATE 10 MG T: 10 | 30 days supply | Qty: 30 | Fill #0

## 2017-12-08 MED FILL — PROAIR HFA 90 MCG INHALER: 108 (90 BAS | 25 days supply | Qty: 9 | Fill #0

## 2017-12-08 NOTE — Telephone Encounter (Signed)
Patient called asking for a refill on his Naproxen. CB # [phone]

## 2017-12-08 NOTE — Telephone Encounter (Signed)
Please advise 

## 2017-12-08 NOTE — Telephone Encounter (Signed)
Called into pharm, pt is aware.

## 2017-12-08 NOTE — Telephone Encounter (Signed)
Yes #30.  500 mg bid prn pain

## 2017-12-16 ENCOUNTER — Ambulatory Visit: Payer: BLUE CROSS/BLUE SHIELD | Admitting: Family Medicine

## 2018-01-04 ENCOUNTER — Encounter (INDEPENDENT_AMBULATORY_CARE_PROVIDER_SITE_OTHER): Payer: Self-pay | Admitting: Orthopaedic Surgery

## 2018-01-04 ENCOUNTER — Ambulatory Visit (INDEPENDENT_AMBULATORY_CARE_PROVIDER_SITE_OTHER): Payer: BLUE CROSS/BLUE SHIELD | Admitting: Orthopaedic Surgery

## 2018-01-04 DIAGNOSIS — M75101 Unspecified rotator cuff tear or rupture of right shoulder, not specified as traumatic: Secondary | ICD-10-CM

## 2018-01-04 DIAGNOSIS — S46811A Strain of other muscles, fascia and tendons at shoulder and upper arm level, right arm, initial encounter: Secondary | ICD-10-CM | POA: Diagnosis not present

## 2018-01-04 NOTE — Progress Notes (Signed)
   Office Visit Note   Patient: Edward Wilson           Date of Birth: [birth year]           MRN: [id number] Visit Date: 01/04/2018              Requested by: Alfonse Spruce, FNP No address on file PCP: Alfonse Spruce, FNP   Assessment & Plan: Visit Diagnoses:  1. Infraspinatus tendon tear, right, initial encounter   2. Tear of right supraspinatus tendon     Plan: Impression 57 year old gentleman with large full-thickness retracted rotator cuff tear.  Patient would like to undergo rotator cuff repair with debridement as indicated.  We again reviewed the MRI findings today.  He understands the risks and benefits associated with the surgery.  He would like to have surgery during the first week of June.  Questions encouraged and answered.  We will see him in the near future for surgery.  Follow-Up Instructions: Return if symptoms worsen or fail to improve.   Orders:  No orders of the defined types were placed in this encounter.  No orders of the defined types were placed in this encounter.     Procedures: No procedures performed   Clinical Data: No additional findings.   Subjective: Chief Complaint  Patient presents with  . Right Shoulder - Pain, Follow-up    Patient comes in today for follow-up of his right shoulder pain.  His previous MRI showed a large full-thickness retracted tear of the supraspinatus and infraspinatus.  He is getting better but he understands that he should undergo rotator cuff repair.   Review of Systems   Objective: Vital Signs: There were no vitals taken for this visit.  Physical Exam  Ortho Exam Right shoulder exam shows 3 out of 5 supraspinatus and infraspinatus.  Passive range of motion is normal. Specialty Comments:  No specialty comments available.  Imaging: No results found.   PMFS History: Patient Active Problem List   Diagnosis Date Noted  . Supraspinatus tendon tear, right, initial encounter 11/01/2017  .  Infraspinatus tendon tear, right, initial encounter 11/01/2017  . Hand dermatitis 02/01/2017  . Pure hypercholesterolemia 09/18/2016  . Asthma, chronic 08/07/2016  . History of syphilis 10/14/2015  . Essential hypertension, benign 02/12/2015  . Arthralgia 02/12/2015  . Tobacco use disorder 02/12/2015  . Alcohol use 02/12/2015   Past Medical History:  Diagnosis Date  . Asthma   . Hypertension     Family History  Problem Relation Age of Onset  . Heart failure Mother        heart attack  . Heart attack Mother   . Colon cancer Neg Hx     History reviewed. No pertinent surgical history. Social History   Occupational History  . Not on file  Tobacco Use  . Smoking status: Former Smoker    Packs/day: 0.10    Types: Cigarettes    Last attempt to quit: 01/25/2017    Years since quitting: 0.9  . Smokeless tobacco: Never Used  . Tobacco comment: Smoking 1-2 cigs 3 days per week  Substance and Sexual Activity  . Alcohol use: Yes    Comment: 2, 40 oz of beer per week   . Drug use: No  . Sexual activity: Not on file

## 2018-05-29 IMAGING — MR MR SHOULDER*R* W/O CM
6 of 7 series · 34 of 40 positions shown · non-contrast
Comparison: None.

CLINICAL DATA: Chronic right shoulder pain. Limited range of
motion.

EXAM:
MRI OF THE RIGHT SHOULDER WITHOUT CONTRAST
TECHNIQUE: Multiplanar, multisequence MR imaging of the shoulder was performed.
No intravenous contrast was administered.

[Series 3: T2 fat-sat · axial · 4.0mm · 0.27mm/px · z∈[-54,+42]mm · 7 of 22 slices shown (1 of 5)]
[im 1/22]
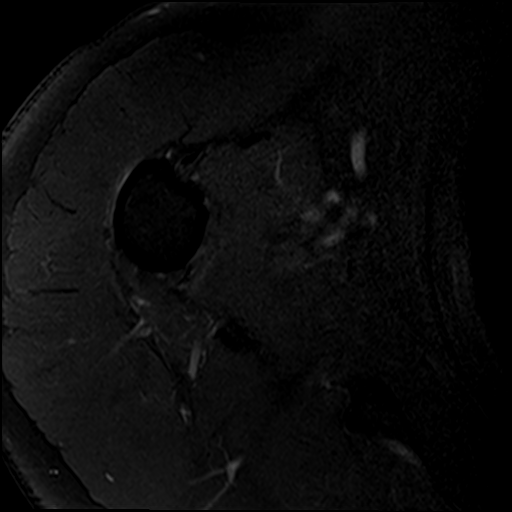
[im 4/22]
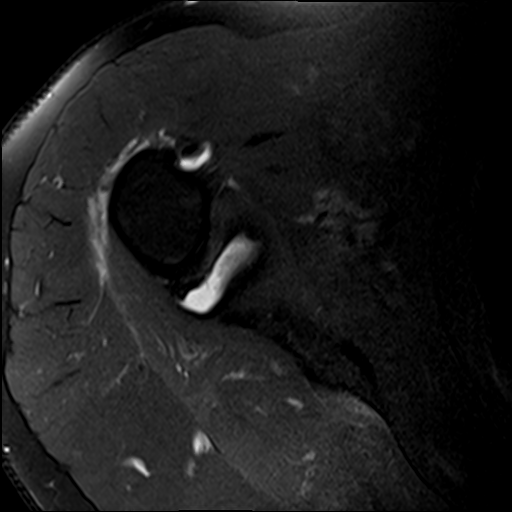
[im 8/22]
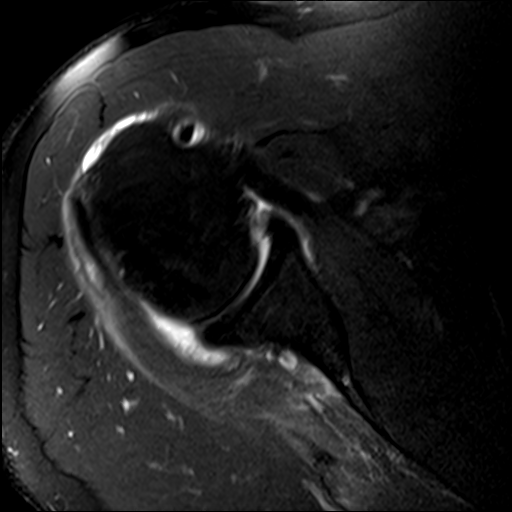
[im 11/22]
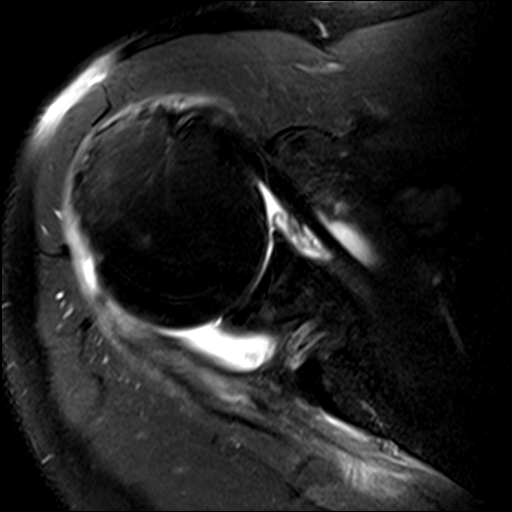
[im 15/22]
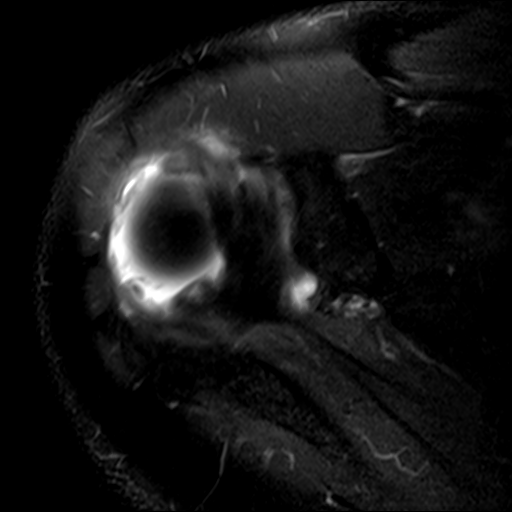
[im 18/22]
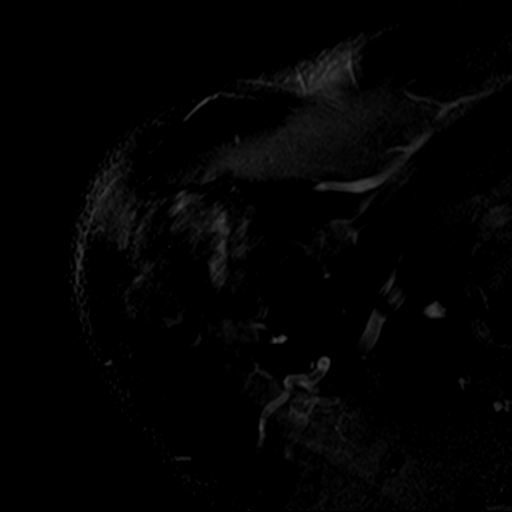
[im 22/22]
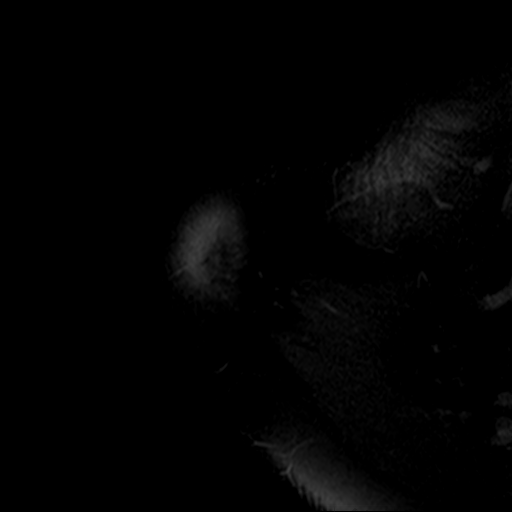

[Series 4: T2 fat-sat · oblique · 4.0mm · 0.55mm/px · 5 of 18 slices shown (2 of 5)]
[im 1/18]
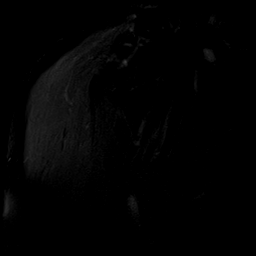
[im 5/18]
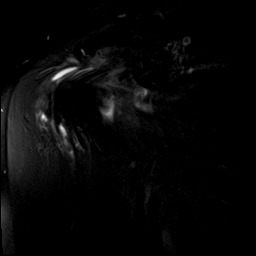
[im 9/18]
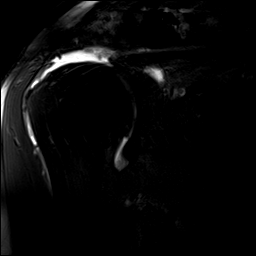
[im 13/18]
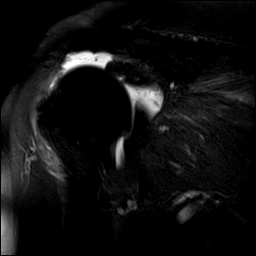
[im 18/18]
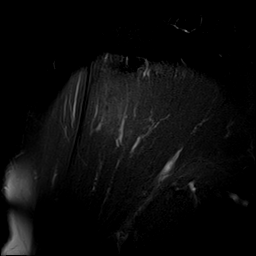

[Series 5: PD · oblique · 4.0mm · 0.27mm/px · 5 of 18 slices shown]
[im 1/18]
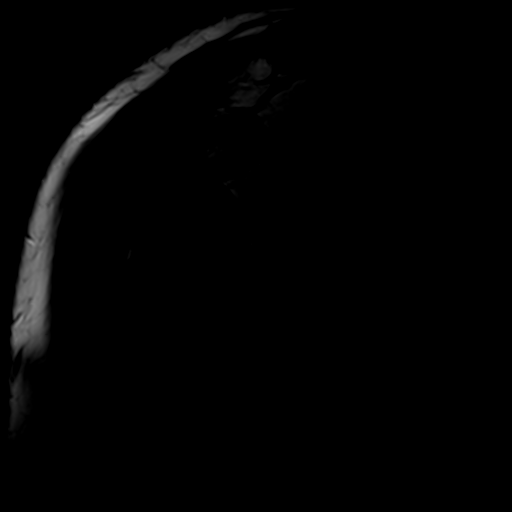
[im 5/18]
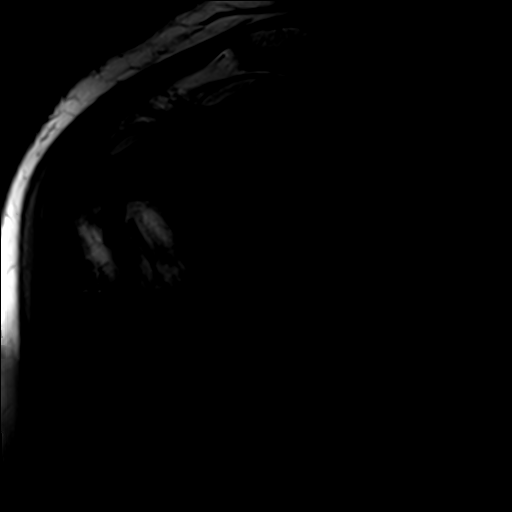
[im 9/18]
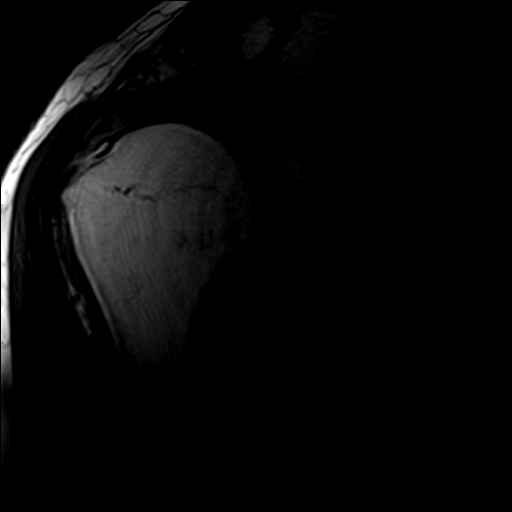
[im 13/18]
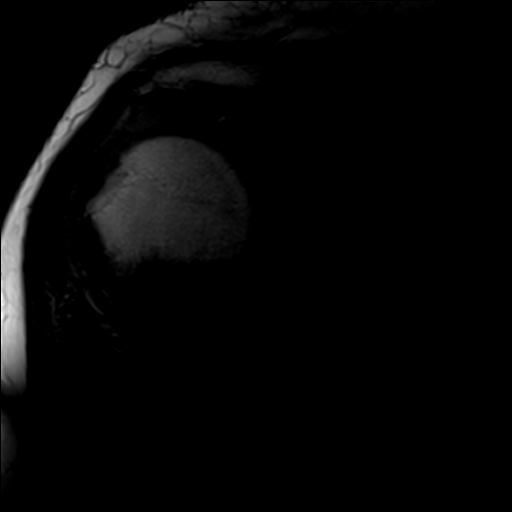
[im 18/18]
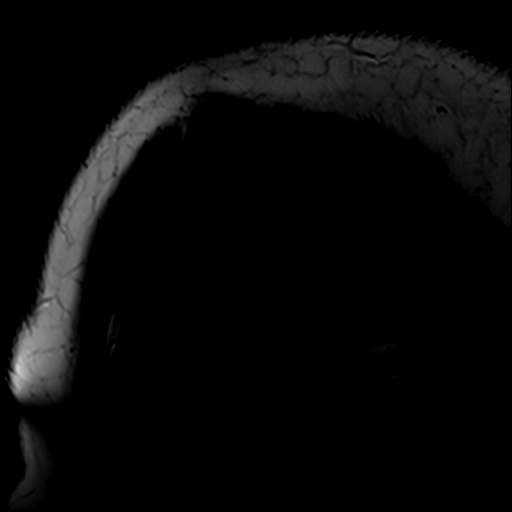

[Series 6: T2 fat-sat · oblique · 4.0mm · 0.55mm/px · 6 of 22 slices shown (3 of 5)]
[im 1/22]
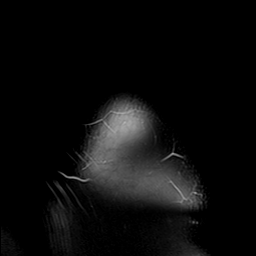
[im 5/22]
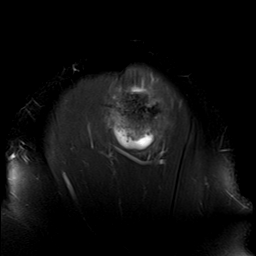
[im 9/22]
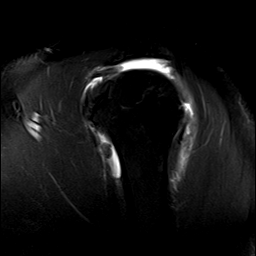
[im 13/22]
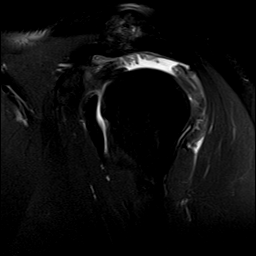
[im 17/22]
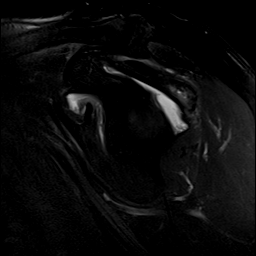
[im 22/22]
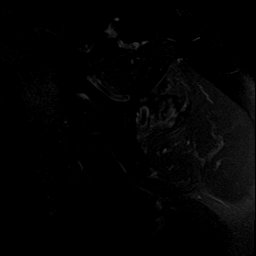

[Series 8: T2 fat-sat · axial · 4.0mm · 0.55mm/px · z∈[-54,+42]mm · 6 of 22 slices shown (4 of 5)]
[im 1/22]
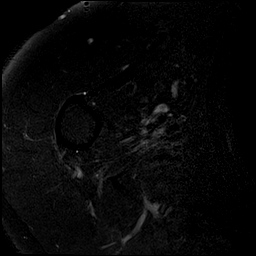
[im 5/22]
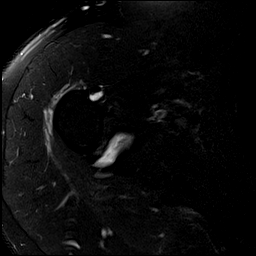
[im 9/22]
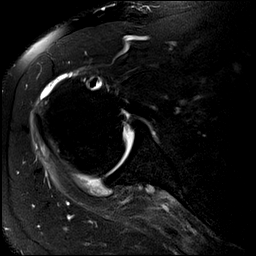
[im 13/22]
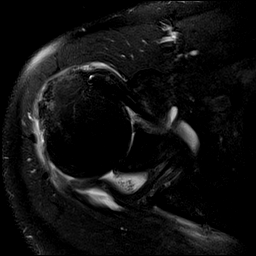
[im 17/22]
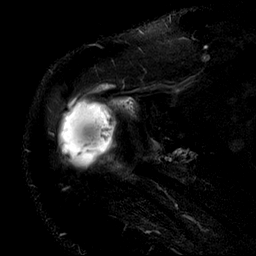
[im 22/22]
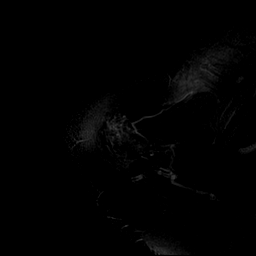

[Series 9: T2 fat-sat · oblique · 4.0mm · 0.55mm/px · 5 of 18 slices shown (5 of 5)]
[im 1/18]
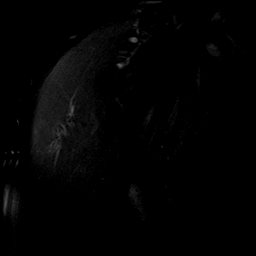
[im 5/18]
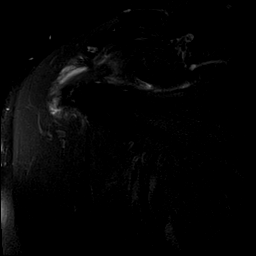
[im 9/18]
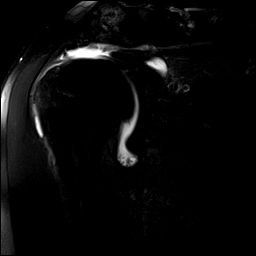
[im 13/18]
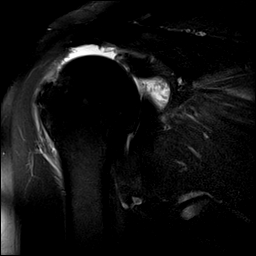
[im 18/18]
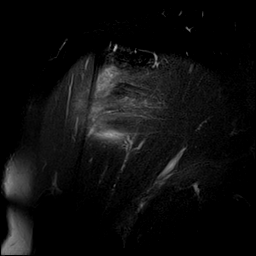

[34 of 40 positions shown; findings below may reference images not displayed]

FINDINGS: Rotator cuff: Complete tear of the supraspinatus and infraspinatus
tendons with 4.7 cm of retraction. Teres minor tendon is intact.
Subscapularis tendon is intact.

Muscles: No atrophy or fatty replacement of nor abnormal signal
within, the muscles of the rotator cuff.

Biceps long head: Mild tendinosis of the intra-articular portion of
the long head of the biceps tendon.

Acromioclavicular Joint: Moderate arthropathy of the
acromioclavicular joint. Type I acromion. Small amount of
subacromial/subdeltoid bursal fluid.

Glenohumeral Joint: Moderate joint effusion. Partial-thickness
cartilage loss of the glenohumeral joint.

Labrum:  Intact.

Bones: No acute osseous abnormality. No fracture or dislocation. No
aggressive osseous lesion.

Other: No fluid collection or hematoma.
IMPRESSION: 1. Complete tear of the supraspinatus and infraspinatus tendons with
4.7 cm of retraction.
2. Mild tendinosis of the intra-articular portion of the long head
of the biceps tendon.

## 2018-05-30 ENCOUNTER — Encounter: Payer: Self-pay | Admitting: Family Medicine

## 2018-05-30 ENCOUNTER — Ambulatory Visit: Payer: Self-pay | Attending: Family Medicine | Admitting: Family Medicine

## 2018-05-30 ENCOUNTER — Other Ambulatory Visit: Payer: Self-pay

## 2018-05-30 VITALS — BP 153/80 | HR 65 | Temp 99.0°F | Resp 18 | Ht 77.0 in | Wt 289.8 lb

## 2018-05-30 DIAGNOSIS — I10 Essential (primary) hypertension: Secondary | ICD-10-CM

## 2018-05-30 DIAGNOSIS — Z23 Encounter for immunization: Secondary | ICD-10-CM

## 2018-05-30 DIAGNOSIS — J4520 Mild intermittent asthma, uncomplicated: Secondary | ICD-10-CM

## 2018-05-30 DIAGNOSIS — Z87891 Personal history of nicotine dependence: Secondary | ICD-10-CM | POA: Insufficient documentation

## 2018-05-30 DIAGNOSIS — M25511 Pain in right shoulder: Secondary | ICD-10-CM | POA: Insufficient documentation

## 2018-05-30 DIAGNOSIS — M25512 Pain in left shoulder: Secondary | ICD-10-CM

## 2018-05-30 DIAGNOSIS — Z79899 Other long term (current) drug therapy: Secondary | ICD-10-CM | POA: Insufficient documentation

## 2018-05-30 DIAGNOSIS — L299 Pruritus, unspecified: Secondary | ICD-10-CM

## 2018-05-30 DIAGNOSIS — L308 Other specified dermatitis: Secondary | ICD-10-CM

## 2018-05-30 MED ORDER — TRIAMCINOLONE ACETONIDE 0.1 % EX CREA: 1.0000 "application " | TOPICAL_CREAM | Freq: Two times a day (BID) | CUTANEOUS | 6 refills | Status: DC

## 2018-05-30 MED ORDER — AMLODIPINE BESYLATE 10 MG PO TABS: 10.0000 mg | ORAL_TABLET | Freq: Every day | ORAL | 6 refills | Status: DC

## 2018-05-30 NOTE — Patient Instructions (Signed)
Shoulder Exercises Ask your health care provider which exercises are safe for you. Do exercises exactly as told by your health care provider and adjust them as directed. It is normal to feel mild stretching, pulling, tightness, or discomfort as you do these exercises, but you should stop right away if you feel sudden pain or your pain gets worse.Do not begin these exercises until told by your health care provider. RANGE OF MOTION EXERCISES These exercises warm up your muscles and joints and improve the movement and flexibility of your shoulder. These exercises also help to relieve pain, numbness, and tingling. These exercises involve stretching your injured shoulder directly. Exercise A: Pendulum  1. Stand near a wall or a surface that you can hold onto for balance. 2. Bend at the waist and let your left / right arm hang straight down. Use your other arm to support you. Keep your back straight and do not lock your knees. 3. Relax your left / right arm and shoulder muscles, and move your hips and your trunk so your left / right arm swings freely. Your arm should swing because of the motion of your body, not because you are using your arm or shoulder muscles. 4. Keep moving your body so your arm swings in the following directions, as told by your health care provider: ? Side to side. ? Forward and backward. ? In clockwise and counterclockwise circles. 5. Continue each motion for __________ seconds, or for as long as told by your health care provider. 6. Slowly return to the starting position. Repeat __________ times. Complete this exercise __________ times a day. Exercise B:Flexion, Standing  1. Stand and hold a broomstick, a cane, or a similar object. Place your hands a little more than shoulder-width apart on the object. Your left / right hand should be palm-up, and your other hand should be palm-down. 2. Keep your elbow straight and keep your shoulder muscles relaxed. Push the stick down with  your healthy arm to raise your left / right arm in front of your body, and then over your head until you feel a stretch in your shoulder. ? Avoid shrugging your shoulder while you raise your arm. Keep your shoulder blade tucked down toward the middle of your back. 3. Hold for __________ seconds. 4. Slowly return to the starting position. Repeat __________ times. Complete this exercise __________ times a day. Exercise C: Abduction, Standing 1. Stand and hold a broomstick, a cane, or a similar object. Place your hands a little more than shoulder-width apart on the object. Your left / right hand should be palm-up, and your other hand should be palm-down. 2. While keeping your elbow straight and your shoulder muscles relaxed, push the stick across your body toward your left / right side. Raise your left / right arm to the side of your body and then over your head until you feel a stretch in your shoulder. ? Do not raise your arm above shoulder height, unless your health care provider tells you to do that. ? Avoid shrugging your shoulder while you raise your arm. Keep your shoulder blade tucked down toward the middle of your back. 3. Hold for __________ seconds. 4. Slowly return to the starting position. Repeat __________ times. Complete this exercise __________ times a day. Exercise D:Internal Rotation  1. Place your left / right hand behind your back, palm-up. 2. Use your other hand to dangle an exercise band, a towel, or a similar object over your shoulder. Grasp the band with   your left / right hand so you are holding onto both ends. 3. Gently pull up on the band until you feel a stretch in the front of your left / right shoulder. ? Avoid shrugging your shoulder while you raise your arm. Keep your shoulder blade tucked down toward the middle of your back. 4. Hold for __________ seconds. 5. Release the stretch by letting go of the band and lowering your hands. Repeat __________ times. Complete  this exercise __________ times a day. STRETCHING EXERCISES These exercises warm up your muscles and joints and improve the movement and flexibility of your shoulder. These exercises also help to relieve pain, numbness, and tingling. These exercises are done using your healthy shoulder to help stretch the muscles of your injured shoulder. Exercise E: Corner Stretch (External Rotation and Abduction)  1. Stand in a doorway with one of your feet slightly in front of the other. This is called a staggered stance. If you cannot reach your forearms to the door frame, stand facing a corner of a room. 2. Choose one of the following positions as told by your health care provider: ? Place your hands and forearms on the door frame above your head. ? Place your hands and forearms on the door frame at the height of your head. ? Place your hands on the door frame at the height of your elbows. 3. Slowly move your weight onto your front foot until you feel a stretch across your chest and in the front of your shoulders. Keep your head and chest upright and keep your abdominal muscles tight. 4. Hold for __________ seconds. 5. To release the stretch, shift your weight to your back foot. Repeat __________ times. Complete this stretch __________ times a day. Exercise F:Extension, Standing 1. Stand and hold a broomstick, a cane, or a similar object behind your back. ? Your hands should be a little wider than shoulder-width apart. ? Your palms should face away from your back. 2. Keeping your elbows straight and keeping your shoulder muscles relaxed, move the stick away from your body until you feel a stretch in your shoulder. ? Avoid shrugging your shoulders while you move the stick. Keep your shoulder blade tucked down toward the middle of your back. 3. Hold for __________ seconds. 4. Slowly return to the starting position. Repeat __________ times. Complete this exercise __________ times a day. STRENGTHENING  EXERCISES These exercises build strength and endurance in your shoulder. Endurance is the ability to use your muscles for a long time, even after they get tired. Exercise G:External Rotation  1. Sit in a stable chair without armrests. 2. Secure an exercise band at elbow height on your left / right side. 3. Place a soft object, such as a folded towel or a small pillow, between your left / right upper arm and your body to move your elbow a few inches away (about 10 cm) from your side. 4. Hold the end of the band so it is tight and there is no slack. 5. Keeping your elbow pressed against the soft object, move your left / right forearm out, away from your abdomen. Keep your body steady so only your forearm moves. 6. Hold for __________ seconds. 7. Slowly return to the starting position. Repeat __________ times. Complete this exercise __________ times a day. Exercise H:Shoulder Abduction  1. Sit in a stable chair without armrests, or stand. 2. Hold a __________ weight in your left / right hand, or hold an exercise band with both hands.   3. Start with your arms straight down and your left / right palm facing in, toward your body. 4. Slowly lift your left / right hand out to your side. Do not lift your hand above shoulder height unless your health care provider tells you that this is safe. ? Keep your arms straight. ? Avoid shrugging your shoulder while you do this movement. Keep your shoulder blade tucked down toward the middle of your back. 5. Hold for __________ seconds. 6. Slowly lower your arm, and return to the starting position. Repeat __________ times. Complete this exercise __________ times a day. Exercise I:Shoulder Extension 1. Sit in a stable chair without armrests, or stand. 2. Secure an exercise band to a stable object in front of you where it is at shoulder height. 3. Hold one end of the exercise band in each hand. Your palms should face each other. 4. Straighten your elbows and  lift your hands up to shoulder height. 5. Step back, away from the secured end of the exercise band, until the band is tight and there is no slack. 6. Squeeze your shoulder blades together as you pull your hands down to the sides of your thighs. Stop when your hands are straight down by your sides. Do not let your hands go behind your body. 7. Hold for __________ seconds. 8. Slowly return to the starting position. Repeat __________ times. Complete this exercise __________ times a day. Exercise J:Standing Shoulder Row 1. Sit in a stable chair without armrests, or stand. 2. Secure an exercise band to a stable object in front of you so it is at waist height. 3. Hold one end of the exercise band in each hand. Your palms should be in a thumbs-up position. 4. Bend each of your elbows to an "L" shape (about 90 degrees) and keep your upper arms at your sides. 5. Step back until the band is tight and there is no slack. 6. Slowly pull your elbows back behind you. 7. Hold for __________ seconds. 8. Slowly return to the starting position. Repeat __________ times. Complete this exercise __________ times a day. Exercise K:Shoulder Press-Ups  1. Sit in a stable chair that has armrests. Sit upright, with your feet flat on the floor. 2. Put your hands on the armrests so your elbows are bent and your fingers are pointing forward. Your hands should be about even with the sides of your body. 3. Push down on the armrests and use your arms to lift yourself off of the chair. Straighten your elbows and lift yourself up as much as you comfortably can. ? Move your shoulder blades down, and avoid letting your shoulders move up toward your ears. ? Keep your feet on the ground. As you get stronger, your feet should support less of your body weight as you lift yourself up. 4. Hold for __________ seconds. 5. Slowly lower yourself back into the chair. Repeat __________ times. Complete this exercise __________ times a  day. Exercise L: Wall Push-Ups  1. Stand so you are facing a stable wall. Your feet should be about one arm-length away from the wall. 2. Lean forward and place your palms on the wall at shoulder height. 3. Keep your feet flat on the floor as you bend your elbows and lean forward toward the wall. 4. Hold for __________ seconds. 5. Straighten your elbows to push yourself back to the starting position. Repeat __________ times. Complete this exercise __________ times a day. This information is not intended to replace advice   given to you by your health care provider. Make sure you discuss any questions you have with your health care provider. Document Released: 08/05/2005 Document Revised: 06/15/2016 Document Reviewed: 06/02/2015 Elsevier Interactive Patient Education  2018 [employer].  Asthma, Adult Asthma is a condition of the lungs in which the airways tighten and narrow. Asthma can make it hard to breathe. Asthma cannot be cured, but medicine and lifestyle changes can help control it. Asthma may be started (triggered) by:  Animal skin flakes (dander).  Dust.  Cockroaches.  Pollen.  Mold.  Smoke.  Cleaning products.  Hair sprays or aerosol sprays.  Paint fumes or strong smells.  Cold air, weather changes, and winds.  Crying or laughing hard.  Stress.  Certain medicines or drugs.  Foods, such as dried fruit, potato chips, and sparkling grape juice.  Infections or conditions (colds, flu).  Exercise.  Certain medical conditions or diseases.  Exercise or tiring activities.  Follow these instructions at home:  Take medicine as told by your doctor.  Use a peak flow meter as told by your doctor. A peak flow meter is a tool that measures how well the lungs are working.  Record and keep track of the peak flow meter's readings.  Understand and use the asthma action plan. An asthma action plan is a written plan for taking care of your asthma and treating your  attacks.  To help prevent asthma attacks: ? Do not smoke. Stay away from secondhand smoke. ? Change your heating and air conditioning filter often. ? Limit your use of fireplaces and wood stoves. ? Get rid of pests (such as roaches and mice) and their droppings. ? Throw away plants if you see mold on them. ? Clean your floors. Dust regularly. Use cleaning products that do not smell. ? Have someone vacuum when you are not home. Use a vacuum cleaner with a HEPA filter if possible. ? Replace carpet with wood, tile, or vinyl flooring. Carpet can trap animal skin flakes and dust. ? Use allergy-proof pillows, mattress covers, and box spring covers. ? Wash bed sheets and blankets every week in hot water and dry them in a dryer. ? Use blankets that are made of polyester or cotton. ? Clean bathrooms and kitchens with bleach. If possible, have someone repaint the walls in these rooms with mold-resistant paint. Keep out of the rooms that are being cleaned and painted. ? Wash hands often. Contact a doctor if:  You have make a whistling sound when breaking (wheeze), have shortness of breath, or have a cough even if taking medicine to prevent attacks.  The colored mucus you cough up (sputum) is thicker than usual.  The colored mucus you cough up changes from clear or white to yellow, green, gray, or bloody.  You have problems from the medicine you are taking such as: ? A rash. ? Itching. ? Swelling. ? Trouble breathing.  You need reliever medicines more than 2-3 times a week.  Your peak flow measurement is still at 50-79% of your personal best after following the action plan for 1 hour.  You have a fever. Get help right away if:  You seem to be worse and are not responding to medicine during an asthma attack.  You are short of breath even at rest.  You get short of breath when doing very little activity.  You have trouble eating, drinking, or talking.  You have chest pain.  You have  a fast heartbeat.  Your lips or fingernails  start to turn blue.  You are light-headed, dizzy, or faint.  Your peak flow is less than 50% of your personal best. This information is not intended to replace advice given to you by your health care provider. Make sure you discuss any questions you have with your health care provider. Document Released: 03/09/2008 Document Revised: 02/27/2016 Document Reviewed: 04/20/2013 Elsevier Interactive Patient Education  2017 [employer].

## 2018-05-30 NOTE — Progress Notes (Signed)
Subjective:    Patient ID: Edward Wilson, male    DOB: 07-05-1961, 57 y.o.   MRN: [id number]  HPI 57 yo male who is an established patient who returns for continued follow-up and management of chronic medical issues.  Patient reports a history of hypertension, asthma and right shoulder pain.  Patient states that he was actually supposed to have surgery for repair of the right shoulder and saved up money to have the shoulder repair done but was then told that the surgery would cost an additional $3000 and patient could not afford the surgery.  Patient however states that he has now regained range of motion in the right shoulder and pain has subsided.  Patient however has now developed some left shoulder pain which is worse with movement of the shoulder or lying on his left side.  Patient states that the pain is bearable about a 5-6 on a 0-to-10 scale.  Patient does take naproxen as needed for pain and patient states that he has had no stomach upset and that the naproxen does not bother his asthma.  Patient states that when he was younger, his asthma was worse but he believes he may have grown out of his asthma.  Patient does admit to increased shortness of breath with changes in the seasons as his asthma is triggered by pollen and cold weather.  Patient is no longer taking Advair or Singulair but does use albuterol during episodes of increased shortness of breath.  Patient states that he usually has about 2 weeks of difficulty with the pollen in the spring and then about 2 weeks of increased shortness of breath at the onset of cold weather.      Patient admits to some mild headache since being out of his blood pressure medications.  Patient states that it has been 2 to 3 months since he last took his medication for his hypertension.  Patient with family history of maternal aunts with diabetes but there is been no family history of hypertension, strokes or cancers.  Patient admits that he uses tobacco and  alcohol/beer bout 2 out of 7 days/week.  Patient reports that he has had his colonoscopy within the last 5 years but no polyps were found.  Patient states that on initial colonoscopy he did have polyps which required 5-year repeat of colonoscopy.  Patient also with complaint of recurrent rash especially on his arms, sometimes on his legs or any place that he suffers any wounds as well as on the back of his hands.  Patient reports he did see a dermatologist but the dermatologist seemed uninterested and patient did not get a clear explanation of what was going on with his skin.  Past Medical History:  Diagnosis Date  . Asthma   . Hypertension    Social History   Tobacco Use  . Smoking status: Former Smoker    Packs/day: 0.10    Types: Cigarettes    Last attempt to quit: 01/25/2017    Years since quitting: 1.3  . Smokeless tobacco: Never Used  . Tobacco comment: Smoking 1-2 cigs 3 days per week  Substance Use Topics  . Alcohol use: Yes    Comment: 2, 40 oz of beer per week   . Drug use: No   Family History  Problem Relation Age of Onset  . Heart failure Mother        heart attack  . Heart attack Mother   . Colon cancer Neg Hx    Allergies  Allergen Reactions  . Shellfish Allergy Hives     Review of Systems  Constitutional: Positive for fatigue (mild). Negative for chills, fever and unexpected weight change.  HENT: Negative for sinus pain, sore throat and trouble swallowing.   Respiratory: Negative for cough, shortness of breath and wheezing.   Cardiovascular: Negative for chest pain, palpitations and leg swelling.  Gastrointestinal: Negative for abdominal pain and blood in stool.  Genitourinary: Negative for dysuria and frequency.  Musculoskeletal: Negative for back pain and gait problem.       Patient with improvement in right shoulder pain and range of motion but now with some left shoulder pain  Skin: Positive for rash (Recurrent rash on the arms and hands that is sometimes  itchy).  Neurological: Positive for headaches. Negative for dizziness.  Psychiatric/Behavioral: Negative for sleep disturbance and suicidal ideas.       Objective:   Physical Exam  BP (!) 153/80 (BP Location: Left Arm, Patient Position: Sitting, Cuff Size: Normal)   Pulse 65   Temp 99 F (37.2 C) (Oral)   Resp 18   Ht 6\' 5"  (1.956 m)   Wt 289 lb 12.8 oz (131.5 kg)   SpO2 96%   BMI 34.37 kg/m  General- large framed, overweight for height older male in no acute distress. ENT-TMs gray, moderate edema of the nasal mucosa, mild posterior pharynx erythema slightly narrowed posterior airway with a large tongue base Neck-supple, no lymphadenopathy, no thyromegaly, no carotid bruit. Lungs-clear to auscultation bilaterally. Cardiovascular-regular rate and rhythm. Abdomen-soft, nontender.  Back-no CVA tenderness. Musculoskeletal- patient with positive impingement sign/empty can sign at both shoulders.  Patient does not have any significant give however with pressure/pushing on the arms with resistance.  Patient with ability to lift both arms overhead but this does cause discomfort bilaterally. Extremities-no edema Skin- patient with areas of dry, grayish, slightly flaky skin above the knuckles and between the fingers on the dorsum of the hands left greater than right, patient also with multiple healed small scars/abrasions on both forearms and skin appears dry and slightly flaky on the forearms        Assessment & Plan:  1. Essential hypertension, benign Per medication list, patient has been on amlodipine, losartan and hydrochlorothiazide.  Patient's blood pressure at today's visit is at 153/80.  Patient's blood pressure can likely be controlled on only one agent and therefore will receive a refill of amlodipine but should return for blood pressure recheck in a few weeks.  At this time will have patient discontinue losartan and HCTZ but these can be restarted if patient's blood pressure is  elevated or if patient has any issues with peripheral edema associated with amlodipine in which case amlodipine dose will be decreased to 5 mg. - amLODipine (NORVASC) 10 MG tablet; Take 1 tablet (10 mg total) by mouth daily.  Dispense: 30 tablet; Refill: 6  2. Pruritic dermatitis Patient with itchy rash which is likely atopic dermatitis/eczema.  Patient will be referred to dermatology and given refill of triamcinolone cream which is helped in the past. - triamcinolone cream (KENALOG) 0.1 %; Apply 1 application topically 2 (two) times daily. As needed  Dispense: 30 g; Refill: 6 - Ambulatory referral to Dermatology  3. Mild intermittent asthma without complication Patient with mild intermittent asthma by patient report as he states that he only has occasional seasonal flareups.  Patient has not been taking Advair.  Patient will continue use of albuterol inhaler as needed with avoidance of triggers when possible.  4. Left shoulder pain, unspecified chronicity Patient with pain in the left shoulder which is likely secondary to rotator cuff tendinopathy.  Patient has been taking nonsteroidal anti-inflammatories and states that these do not bother his asthma.  Patient given patient education regarding shoulder range of motion exercises.  Patient should call or return to clinic if he feels he needs referral back to orthopedics regarding current left shoulder pain or prior pain in the right shoulder.  5. Need for immunization against influenza Patient was offered and agreed to have influenza immunization at today's visit.  Patient was given educational handout regarding influenza immunization and should call or return if he has any questions or concerns. - Flu Vaccine QUAD 36+ mos IM  An After Visit Summary was printed and given to the patient.  Return in about 4 months (around 09/29/2018) for BP check 3-4 weeks with CPP Lurena Joiner); 4 months.

## 2018-05-31 ENCOUNTER — Telehealth: Payer: Self-pay | Admitting: Family Medicine

## 2018-05-31 NOTE — Telephone Encounter (Signed)
Patient wants to know why Dr Chapman Fitch change his Blood pressure medicine . Please, call him Thank you

## 2018-06-01 ENCOUNTER — Encounter: Payer: Self-pay | Admitting: Family Medicine

## 2018-06-01 NOTE — Telephone Encounter (Signed)
Please let patient know that at his recent visit, his blood pressure was at 153/80.  Patient's goal is to be at 130/80 or less.  Patient was on 3 blood pressure medications therefore only the amlodipine 10 mg was refilled as I felt that this medication would likely get his blood pressure to goal.  Patient should return in a few weeks for blood pressure recheck with the clinical pharmacist.  If his blood pressure is elevated at that time, his prior to medications, Cozaar and hydrochlorothiazide can be restarted.  If patient would prefer, patient is Cozaar and hydrochlorothiazide can be refilled at this time but I would still like for patient to follow-up in a few weeks with the clinical pharmacist or nurse to have blood pressure recheck.

## 2018-06-01 NOTE — Telephone Encounter (Signed)
Patient was called no answer. If patient returns call please inform patient :  Please let patient know that at his recent visit, his blood pressure was at 153/80.  Patient's goal is to be at 130/80 or less.  Patient was on 3 blood pressure medications therefore only the amlodipine 10 mg was refilled as I felt that this medication would likely get his blood pressure to goal.  Patient should return in a few weeks for blood pressure recheck with the clinical pharmacist.  If his blood pressure is elevated at that time, his prior to medications, Cozaar and hydrochlorothiazide can be restarted.  If patient would prefer, patients  Cozaar and hydrochlorothiazide can be refilled at this time but I would still like for patient to follow-up in a few weeks with the clinical pharmacist or nurse to have blood pressure recheck.

## 2018-06-01 NOTE — Telephone Encounter (Signed)
Pt called back, verified DOB and received results left by his nurse, he agreed and will follow up with [facility] on 06/20/2018 for any medication change needed

## 2018-06-20 ENCOUNTER — Encounter: Payer: Self-pay | Admitting: Pharmacist

## 2018-06-20 ENCOUNTER — Ambulatory Visit: Payer: Self-pay | Attending: Family Medicine | Admitting: Pharmacist

## 2018-06-20 VITALS — BP 148/71 | HR 71

## 2018-06-20 DIAGNOSIS — I10 Essential (primary) hypertension: Secondary | ICD-10-CM | POA: Insufficient documentation

## 2018-06-20 NOTE — Patient Instructions (Signed)
Thank you for coming to see [place] today.   Blood pressure today is improving.  Continue taking amlodipine 10 mg daily as prescribed.   Limiting salt and caffeine, as well as exercising as able for at least 30 minutes for 5 days out of the week, can also help you lower your blood pressure.  Take your blood pressure at home if you are able. Please write down these numbers and bring them to your visits.  If you have any questions about medications, please call me [phone].  Lurena Joiner

## 2018-06-20 NOTE — Progress Notes (Signed)
   S:    PCP: Dr. Chapman Fitch   Patient arrives in good spirits. Presents to the clinic for hypertension evaluation, counseling, and management. Patient was referred by Dr. Chapman Fitch on 05/30/18.  BP 153/80. Amlodipine continued.  Today, he denies CP, SOB, HA, or blurred vision. Denies LE edema.   Patient denies adherence with amlodipine 10 mg.  Dietary habits include:  - limits salt - Does not consume caffeine Exercise habits include: - limited d/t shoulder pain Family / Social history:  - Tobacco: occasional use; "when I drink" - Alcohol: two 40 oz beers/week  Home BP readings:  - does not monitor at home  O:  L arm after 5 minutes rest 148/71, HR 71 Last 3 Office BP readings: BP Readings from Last 3 Encounters:  05/30/18 (!) 153/80  09/29/17 (!) 171/78  09/16/17 119/66    BMET    Component Value Date/Time   NA 142 06/11/2017 1555   K 4.1 06/11/2017 1555   CL 102 06/11/2017 1555   CO2 22 06/11/2017 1555   GLUCOSE 80 06/11/2017 1555   GLUCOSE 86 09/17/2016 1727   BUN 13 06/11/2017 1555   CREATININE 0.95 06/11/2017 1555   CREATININE 0.92 09/17/2016 1727   CALCIUM 9.3 06/11/2017 1555   GFRNONAA 88 06/11/2017 1555   GFRNONAA >89 09/17/2016 1727   GFRAA 102 06/11/2017 1555   GFRAA >89 09/17/2016 1727    Renal function: CrCl cannot be calculated (Patient's most recent lab result is older than the maximum 21 days allowed.).  A/P: Hypertension longstanding currently uncontrolled on current medications. BP Goal <130/80 mmHg. Patient is not adherent with current medications.  -Continued amlodipine 10 mg.  -Counseled on lifestyle modifications for blood pressure control including reduced dietary sodium, increased exercise, adequate sleep  Results reviewed and written information provided.   Total time in face-to-face counseling 15 minutes.   F/U Clinic Visit in 07/20/18.   Benard Halsted, PharmD, [place] [phone]

## 2018-07-20 ENCOUNTER — Encounter: Payer: Self-pay | Admitting: Pharmacist

## 2018-09-29 ENCOUNTER — Ambulatory Visit: Payer: Self-pay | Admitting: Family Medicine

## 2018-12-09 ENCOUNTER — Ambulatory Visit: Payer: Self-pay | Admitting: Family Medicine

## 2018-12-22 ENCOUNTER — Ambulatory Visit: Payer: Self-pay | Attending: Family Medicine | Admitting: Physician Assistant

## 2018-12-22 ENCOUNTER — Other Ambulatory Visit: Payer: Self-pay

## 2018-12-22 VITALS — BP 165/91 | HR 71 | Temp 99.2°F | Resp 16 | Ht 77.0 in | Wt 290.8 lb

## 2018-12-22 DIAGNOSIS — J4540 Moderate persistent asthma, uncomplicated: Secondary | ICD-10-CM

## 2018-12-22 DIAGNOSIS — B354 Tinea corporis: Secondary | ICD-10-CM

## 2018-12-22 DIAGNOSIS — I10 Essential (primary) hypertension: Secondary | ICD-10-CM | POA: Diagnosis not present

## 2018-12-22 DIAGNOSIS — E7800 Pure hypercholesterolemia, unspecified: Secondary | ICD-10-CM

## 2018-12-22 MED ORDER — ALBUTEROL SULFATE HFA 108 (90 BASE) MCG/ACT IN AERS: 2.0000 | INHALATION_SPRAY | Freq: Four times a day (QID) | RESPIRATORY_TRACT | 3 refills | Status: DC | PRN

## 2018-12-22 MED ORDER — AMLODIPINE BESYLATE 10 MG PO TABS: 10.0000 mg | ORAL_TABLET | Freq: Every day | ORAL | 6 refills | Status: DC

## 2018-12-22 MED ORDER — CLOTRIMAZOLE-BETAMETHASONE 1-0.05 % EX LOTN: TOPICAL_LOTION | Freq: Two times a day (BID) | CUTANEOUS | 0 refills | Status: DC

## 2018-12-22 MED ORDER — FLUTICASONE-SALMETEROL 250-50 MCG/DOSE IN AEPB: 1.0000 | INHALATION_SPRAY | Freq: Two times a day (BID) | RESPIRATORY_TRACT | 2 refills | Status: DC

## 2018-12-22 MED ORDER — ATORVASTATIN CALCIUM 40 MG PO TABS: 40.0000 mg | ORAL_TABLET | Freq: Every day | ORAL | 11 refills | Status: DC

## 2018-12-22 MED ORDER — MONTELUKAST SODIUM 10 MG PO TABS: 10.0000 mg | ORAL_TABLET | Freq: Every day | ORAL | 11 refills | Status: DC

## 2018-12-22 NOTE — Progress Notes (Signed)
Edward Wilson, is a 58 y.o. male  QZR:[id number]  HLK:[id number]  DOB - 1961-03-15  Subjective:  Chief Complaint and HPI: Edward Wilson is a 58 y.o. male here today for med RF.  Also c/o rash on R thigh and L forearm that itches and comes and goes.  He denies fever.  He's had it for a long time.  triamcinilone didn't help.  Takes his BP meds about 2 days per week.  No CP/HA/Dizziness/sob.    ROS:   Constitutional:  No f/c, No night sweats, No unexplained weight loss. EENT:  No vision changes, No blurry vision, No hearing changes. No mouth, throat, or ear problems.  Respiratory: No cough, No SOB Cardiac: No CP, no palpitations GI:  No abd pain, No N/V/D. GU: No Urinary s/sx Musculoskeletal: No joint pain Neuro: No headache, no dizziness, no motor weakness.  Skin: + rash Endocrine:  No polydipsia. No polyuria.  Psych: Denies SI/HI  No problems updated.  ALLERGIES: Allergies  Allergen Reactions  . Shellfish Allergy Hives    PAST MEDICAL HISTORY: Past Medical History:  Diagnosis Date  . Asthma   . Hypertension     MEDICATIONS AT HOME: Prior to Admission medications   Medication Sig Start Date End Date Taking? Authorizing Provider  albuterol (VENTOLIN HFA) 108 (90 Base) MCG/ACT inhaler Inhale 2 puffs into the lungs every 6 (six) hours as needed for wheezing or shortness of breath. 12/22/18  Yes Freeman Caldron M, PA-C  amLODipine (NORVASC) 10 MG tablet Take 1 tablet (10 mg total) by mouth daily. 12/22/18  Yes Freeman Caldron M, PA-C  atorvastatin (LIPITOR) 40 MG tablet Take 1 tablet (40 mg total) by mouth daily. 12/22/18   Argentina Donovan, PA-C  clotrimazole-betamethasone (LOTRISONE) lotion Apply topically 2 (two) times daily. 12/22/18   Argentina Donovan, PA-C  Fluticasone-Salmeterol (ADVAIR DISKUS) 250-50 MCG/DOSE AEPB Inhale 1 puff into the lungs 2 (two) times daily. 12/22/18   Argentina Donovan, PA-C  montelukast (SINGULAIR) 10 MG tablet Take 1 tablet (10 mg total) by mouth  at bedtime. 12/22/18   Argentina Donovan, PA-C  naproxen (NAPROSYN) 500 MG tablet TAKE ONE TABLET BY MOUTH WITH MEALS TWICE A DAY FOR 5 DAYS. THEN TAKE ONE TABLET BY MOUTH WITH MEAL AS NEEDED. Patient not taking: Reported on 05/30/2018 09/29/17   Alfonse Spruce, FNP  naproxen (NAPROSYN) 500 MG tablet Take 1 tablet (500 mg total) by mouth 2 (two) times daily with a meal. Patient not taking: Reported on 05/30/2018 10/18/17   Leandrew Koyanagi, MD  naproxen (NAPROSYN) 500 MG tablet Take 1 tablet (500 mg total) by mouth 2 (two) times daily as needed. Patient not taking: Reported on 05/30/2018 12/08/17   Leandrew Koyanagi, MD  omeprazole (PRILOSEC) 40 MG capsule Take 1 capsule (40 mg total) by mouth daily. Patient not taking: Reported on 09/29/2017 09/20/16   Boykin Nearing, MD     Objective:  EXAM:   Vitals:   12/22/18 1611  BP: (!) 165/91  Pulse: 71  Resp: 16  Temp: 99.2 F (37.3 C)  TempSrc: Oral  SpO2: 96%  Weight: 290 lb 12.8 oz (131.9 kg)  Height: 6\' 5"  (1.956 m)    General appearance : A&OX3. NAD. Non-toxic-appearing HEENT: Atraumatic and Normocephalic.  PERRLA. EOM intact.  TNeck: supple, no JVD. No cervical lymphadenopathy. No thyromegaly Chest/Lungs:  Breathing-non-labored, Good air entry bilaterally, breath sounds normal without rales, rhonchi, or wheezing  CVS: S1 S2 regular, no murmurs, gallops, rubs  Extremities:  Bilateral Lower Ext shows no edema, both legs are warm to touch with = pulse throughout Neurology:  CN II-XII grossly intact, Non focal.  Psych:  TP linear. J/I WNL. Normal speech. Appropriate eye contact and affect.  Skin:  Erythematous rash with scale on ventral R thigh 3X5cm and 2x2cm and on L forearm 2x2cm with some satellite lesions  Data Review Lab Results  Component Value Date   HGBA1C 5.5 06/11/2017   HGBA1C 5.90 03/15/2015     Assessment & Plan   1. Essential hypertension, benign Uncontrolled/non-complaince.  Compliance stressed - amLODipine  (NORVASC) 10 MG tablet; Take 1 tablet (10 mg total) by mouth daily.  Dispense: 30 tablet; Refill: 6 - Comprehensive metabolic panel - CBC with Differential/Platelet  2. Moderate persistent chronic asthma without complication stable - albuterol (VENTOLIN HFA) 108 (90 Base) MCG/ACT inhaler; Inhale 2 puffs into the lungs every 6 (six) hours as needed for wheezing or shortness of breath.  Dispense: 18 g; Refill: 3 - Fluticasone-Salmeterol (ADVAIR DISKUS) 250-50 MCG/DOSE AEPB; Inhale 1 puff into the lungs 2 (two) times daily.  Dispense: 60 each; Refill: 2 - montelukast (SINGULAIR) 10 MG tablet; Take 1 tablet (10 mg total) by mouth at bedtime.  Dispense: 30 tablet; Refill: 11  3. Pure hypercholesterolemia - atorvastatin (LIPITOR) 40 MG tablet; Take 1 tablet (40 mg total) by mouth daily.  Dispense: 30 tablet; Refill: 11  4. High cholesterol  - atorvastatin (LIPITOR) 40 MG tablet; Take 1 tablet (40 mg total) by mouth daily.  Dispense: 30 tablet; Refill: 11  5. Tinea corporis - clotrimazole-betamethasone (LOTRISONE) lotion; Apply topically 2 (two) times daily.  Dispense: 30 mL; Refill: 0 -refer if does not resolve     Patient have been counseled extensively about nutrition and exercise  Return for Dr Chapman Fitch; cholesterol and htn.  The patient was given clear instructions to go to ER or return to medical center if symptoms don't improve, worsen or new problems develop. The patient verbalized understanding. The patient was told to call to get lab results if they haven't heard anything in the next week.     Freeman Caldron, PA-C [facility] and [facility] [place], [place]   12/22/2018, 4:24 PMPatient ID: Edward Wilson, male   DOB: 26-Mar-1961, 58 y.o.   MRN: [id number]

## 2018-12-22 NOTE — Progress Notes (Signed)
Patient stated he having a lower left side back pain.

## 2018-12-23 LAB — COMPREHENSIVE METABOLIC PANEL
ALK PHOS: 84 IU/L (ref 39–117)
ALT: 14 IU/L (ref 0–44)
AST: 17 IU/L (ref 0–40)
Albumin/Globulin Ratio: 1.5 (ref 1.2–2.2)
Albumin: 4.4 g/dL (ref 3.8–4.9)
BILIRUBIN TOTAL: 0.3 mg/dL (ref 0.0–1.2)
BUN / CREAT RATIO: 14 (ref 9–20)
BUN: 12 mg/dL (ref 6–24)
CHLORIDE: 102 mmol/L (ref 96–106)
CO2: 22 mmol/L (ref 20–29)
Calcium: 9.3 mg/dL (ref 8.7–10.2)
Creatinine, Ser: 0.85 mg/dL (ref 0.76–1.27)
GFR calc Af Amer: 110 mL/min/{1.73_m2} (ref >59)
GFR calc non Af Amer: 95 mL/min/{1.73_m2} (ref >59)
GLUCOSE: 87 mg/dL (ref 65–99)
Globulin, Total: 2.9 g/dL (ref 1.5–4.5)
Potassium: 4.1 mmol/L (ref 3.5–5.2)
Sodium: 141 mmol/L (ref 134–144)
Total Protein: 7.3 g/dL (ref 6.0–8.5)

## 2018-12-23 LAB — CBC WITH DIFFERENTIAL/PLATELET
BASOS ABS: 0 10*3/uL (ref 0.0–0.2)
Basos: 0 %
EOS (ABSOLUTE): 0.4 10*3/uL (ref 0.0–0.4)
Eos: 5 %
Hematocrit: 37.8 % (ref 37.5–51.0)
Hemoglobin: 12.5 g/dL — ABNORMAL LOW (ref 13.0–17.7)
Immature Grans (Abs): 0 10*3/uL (ref 0.0–0.1)
Immature Granulocytes: 0 %
Lymphocytes Absolute: 2.6 10*3/uL (ref 0.7–3.1)
Lymphs: 39 %
MCH: 28.3 pg (ref 26.6–33.0)
MCHC: 33.1 g/dL (ref 31.5–35.7)
MCV: 86 fL (ref 79–97)
Monocytes Absolute: 0.7 10*3/uL (ref 0.1–0.9)
Monocytes: 11 %
NEUTROS ABS: 3 10*3/uL (ref 1.4–7.0)
Neutrophils: 45 %
Platelets: 276 10*3/uL (ref 150–450)
RBC: 4.41 x10E6/uL (ref 4.14–5.80)
RDW: 11.8 % (ref 11.6–15.4)
WBC: 6.7 10*3/uL (ref 3.4–10.8)

## 2019-02-23 ENCOUNTER — Other Ambulatory Visit: Payer: Self-pay

## 2019-02-23 ENCOUNTER — Ambulatory Visit: Payer: BLUE CROSS/BLUE SHIELD | Attending: Family Medicine | Admitting: Family Medicine

## 2019-03-03 ENCOUNTER — Ambulatory Visit: Payer: BLUE CROSS/BLUE SHIELD | Admitting: Family Medicine

## 2019-03-06 ENCOUNTER — Other Ambulatory Visit: Payer: Self-pay

## 2019-03-06 ENCOUNTER — Ambulatory Visit: Payer: BLUE CROSS/BLUE SHIELD | Attending: Family Medicine | Admitting: Nurse Practitioner

## 2019-03-06 ENCOUNTER — Encounter: Payer: Self-pay | Admitting: Nurse Practitioner

## 2019-03-06 DIAGNOSIS — I10 Essential (primary) hypertension: Secondary | ICD-10-CM | POA: Diagnosis not present

## 2019-03-06 DIAGNOSIS — J4540 Moderate persistent asthma, uncomplicated: Secondary | ICD-10-CM | POA: Diagnosis not present

## 2019-03-06 DIAGNOSIS — R0789 Other chest pain: Secondary | ICD-10-CM

## 2019-03-06 DIAGNOSIS — E7800 Pure hypercholesterolemia, unspecified: Secondary | ICD-10-CM

## 2019-03-06 DIAGNOSIS — K219 Gastro-esophageal reflux disease without esophagitis: Secondary | ICD-10-CM

## 2019-03-06 MED ORDER — OMEPRAZOLE 40 MG PO CPDR: 40.0000 mg | DELAYED_RELEASE_CAPSULE | Freq: Every day | ORAL | 3 refills | Status: DC

## 2019-03-06 MED ORDER — AMLODIPINE BESYLATE 10 MG PO TABS: 10.0000 mg | ORAL_TABLET | Freq: Every day | ORAL | 1 refills | Status: DC

## 2019-03-06 MED ORDER — ATORVASTATIN CALCIUM 40 MG PO TABS: 40.0000 mg | ORAL_TABLET | Freq: Every day | ORAL | 1 refills | Status: DC

## 2019-03-06 MED ORDER — ALBUTEROL SULFATE HFA 108 (90 BASE) MCG/ACT IN AERS: 2.0000 | INHALATION_SPRAY | Freq: Four times a day (QID) | RESPIRATORY_TRACT | 0 refills | Status: DC | PRN

## 2019-03-06 MED ORDER — LOSARTAN POTASSIUM 50 MG PO TABS: 50.0000 mg | ORAL_TABLET | Freq: Every day | ORAL | 3 refills | Status: DC

## 2019-03-06 NOTE — Progress Notes (Signed)
Virtual Visit via Telephone Note Due to national recommendations of social distancing due to [place] 19, telehealth visit is felt to be most appropriate for this patient at this time.  I discussed the limitations, risks, security and privacy concerns of performing an evaluation and management service by telephone and the availability of in person appointments. I also discussed with the patient that there may be a patient responsible charge related to this service. The patient expressed understanding and agreed to proceed.    I connected with Edward Wilson on 03/06/19  at   3:50 PM EDT  EDT by telephone and verified that I am speaking with the correct person using two identifiers.   Consent I discussed the limitations, risks, security and privacy concerns of performing an evaluation and management service by telephone and the availability of in person appointments. I also discussed with the patient that there may be a patient responsible charge related to this service. The patient expressed understanding and agreed to proceed.   Location of Patient: Private  Residence   Location of Provider: [place] and [place] participating in Telemedicine visit: Geryl Rankins FNP-BC [place]    History of Present Illness: Telemedicine visit for: Musculoskeletal pain Onset 1 month ago with pain markedly decreased over the past several days. Denies any trauma or injury. Aggravating factors:applying direct pressure to his rib cage.  Pain described as dull/aching. Pain scale down to 2-3/10 from 8/10. He denies any epigastric pain, nausea or vomiting.   Essential Hypertension Blood pressure elevated and not well controlled. Currently taking amlodipine 10 mg daily as prescribed. Will add losartan 50mg . Denies chest pain, shortness of breath, palpitations, lightheadedness, dizziness, headaches or BLE edema.  BP Readings from Last 3 Encounters:  12/22/18 (!)  165/91  06/20/18 (!) 148/71  05/30/18 (!) 153/80    Hyperlipidemia Patient presents for follow up to hyperlipidemia.  He is medication compliant taking atorvastatin 40 mg daily. He is not diet compliant and denies exertional chest pressure/discomfort, lower extremity edema and palpitations or statin intolerance including myalgias.  Lab Results  Component Value Date   CHOL 184 06/11/2017   Lab Results  Component Value Date   HDL 75 06/11/2017   Lab Results  Component Value Date   LDLCALC 92 06/11/2017   Lab Results  Component Value Date   TRIG 83 06/11/2017   Lab Results  Component Value Date   CHOLHDL 2.5 06/11/2017     Past Medical History:  Diagnosis Date  . Asthma   . Hypertension     History reviewed. No pertinent surgical history.  Family History  Problem Relation Age of Onset  . Heart failure Mother        heart attack  . Heart attack Mother   . Colon cancer Neg Hx     Social History   Socioeconomic History  . Marital status: Single    Spouse name: Not on file  . Number of children: Not on file  . Years of education: Not on file  . Highest education level: Not on file  Occupational History  . Not on file  Social Needs  . Financial resource strain: Not on file  . Food insecurity:    Worry: Not on file    Inability: Not on file  . Transportation needs:    Medical: Not on file    Non-medical: Not on file  Tobacco Use  . Smoking status: Current Every Day Smoker  Packs/day: 0.10    Types: Cigarettes    Last attempt to quit: 01/25/2017    Years since quitting: 2.1  . Smokeless tobacco: Never Used  . Tobacco comment: Smoking 1-2 cigs 3 days per week  Substance and Sexual Activity  . Alcohol use: Yes    Comment: 2, 40 oz of beer per week   . Drug use: No  . Sexual activity: Not on file  Lifestyle  . Physical activity:    Days per week: Not on file    Minutes per session: Not on file  . Stress: Not on file  Relationships  . Social  connections:    Talks on phone: Not on file    Gets together: Not on file    Attends religious service: Not on file    Active member of club or organization: Not on file    Attends meetings of clubs or organizations: Not on file    Relationship status: Not on file  Other Topics Concern  . Not on file  Social History Narrative  . Not on file     Observations/Objective: Awake, alert and oriented x 3   Review of Systems  Constitutional: Negative for fever, malaise/fatigue and weight loss.  HENT: Negative.  Negative for nosebleeds.   Eyes: Negative.  Negative for blurred vision, double vision and photophobia.  Respiratory: Negative.  Negative for cough and shortness of breath.   Cardiovascular: Negative.  Negative for chest pain, palpitations and leg swelling.  Gastrointestinal: Positive for heartburn. Negative for nausea and vomiting.  Musculoskeletal: Positive for myalgias.  Neurological: Negative.  Negative for dizziness, focal weakness, seizures and headaches.  Psychiatric/Behavioral: Negative.  Negative for suicidal ideas.    Assessment and Plan: [name] was evaluated today for pain.  Diagnoses and all orders for this visit:  Musculoskeletal chest pain Almost completely resolved  Essential hypertension, benign -     amLODipine (NORVASC) 10 MG tablet; Take 1 tablet (10 mg total) by mouth daily. -     losartan (COZAAR) 50 MG tablet; Take 1 tablet (50 mg total) by mouth daily. Continue all antihypertensives as prescribed.  Remember to bring in your blood pressure log with you for your follow up appointment.  DASH/Mediterranean Diets are healthier choices for HTN.    Moderate persistent chronic asthma without complication -     albuterol (VENTOLIN HFA) 108 (90 Base) MCG/ACT inhaler; Inhale 2 puffs into the lungs every 6 (six) hours as needed for wheezing or shortness of breath.  Pure hypercholesterolemia -     atorvastatin (LIPITOR) 40 MG tablet; Take 1 tablet (40 mg total)  by mouth daily. INSTRUCTIONS: Work on a low fat, heart healthy diet and participate in regular aerobic exercise program by working out at least 150 minutes per week; 5 days a week-30 minutes per day. Avoid red meat, fried foods. junk foods, sodas, sugary drinks, unhealthy snacking, alcohol and smoking.  Drink at least 48oz of water per day and monitor your carbohydrate intake daily.    Gastroesophageal reflux disease, esophagitis presence not specified -     omeprazole (PRILOSEC) 40 MG capsule; Take 1 capsule (40 mg total) by mouth daily. INSTRUCTIONS: Avoid GERD Triggers: acidic, spicy or fried foods, caffeine, coffee, sodas,  alcohol and chocolate.     Follow Up Instructions Return in about 6 weeks (around 04/17/2019) for PCP.     I discussed the assessment and treatment plan with the patient. The patient was provided an opportunity to ask questions and all were  answered. The patient agreed with the plan and demonstrated an understanding of the instructions.   The patient was advised to call back or seek an in-person evaluation if the symptoms worsen or if the condition fails to improve as anticipated.  I provided 21 minutes of non-face-to-face time during this encounter including median intraservice time, reviewing previous notes, labs, imaging, medications and explaining diagnosis and management.  Gildardo Pounds, FNP-BC

## 2019-03-17 ENCOUNTER — Other Ambulatory Visit: Payer: BLUE CROSS/BLUE SHIELD

## 2019-03-21 ENCOUNTER — Other Ambulatory Visit: Payer: Self-pay | Admitting: Nurse Practitioner

## 2019-03-21 ENCOUNTER — Ambulatory Visit: Payer: BC Managed Care – PPO | Attending: Family Medicine

## 2019-03-21 ENCOUNTER — Other Ambulatory Visit: Payer: Self-pay

## 2019-03-21 DIAGNOSIS — Z125 Encounter for screening for malignant neoplasm of prostate: Secondary | ICD-10-CM | POA: Diagnosis not present

## 2019-03-21 DIAGNOSIS — R7989 Other specified abnormal findings of blood chemistry: Secondary | ICD-10-CM

## 2019-03-21 DIAGNOSIS — Z13220 Encounter for screening for lipoid disorders: Secondary | ICD-10-CM

## 2019-03-22 ENCOUNTER — Other Ambulatory Visit: Payer: Self-pay | Admitting: Nurse Practitioner

## 2019-03-22 DIAGNOSIS — D649 Anemia, unspecified: Secondary | ICD-10-CM

## 2019-03-22 LAB — LIPID PANEL
Chol/HDL Ratio: 2.6 ratio (ref 0.0–5.0)
Cholesterol, Total: 173 mg/dL (ref 100–199)
HDL: 67 mg/dL (ref >39)
LDL Calculated: 90 mg/dL (ref 0–99)
Triglycerides: 79 mg/dL (ref 0–149)
VLDL Cholesterol Cal: 16 mg/dL (ref 5–40)

## 2019-03-22 LAB — CBC
Hematocrit: 37 % — ABNORMAL LOW (ref 37.5–51.0)
Hemoglobin: 12.2 g/dL — ABNORMAL LOW (ref 13.0–17.7)
MCH: 28.6 pg (ref 26.6–33.0)
MCHC: 33 g/dL (ref 31.5–35.7)
MCV: 87 fL (ref 79–97)
Platelets: 245 10*3/uL (ref 150–450)
RBC: 4.26 x10E6/uL (ref 4.14–5.80)
RDW: 12.6 % (ref 11.6–15.4)
WBC: 7.7 10*3/uL (ref 3.4–10.8)

## 2019-03-22 LAB — PSA: Prostate Specific Ag, Serum: 2.1 ng/mL (ref 0.0–4.0)

## 2019-03-29 NOTE — Progress Notes (Signed)
CMA spoke to patient to inform on results and referral.  Pt. Verified DOB. Pt. Understood.

## 2019-04-11 ENCOUNTER — Telehealth: Payer: Self-pay

## 2019-04-11 NOTE — Telephone Encounter (Signed)
Phone screening complete 

## 2019-04-12 ENCOUNTER — Ambulatory Visit (INDEPENDENT_AMBULATORY_CARE_PROVIDER_SITE_OTHER): Payer: BC Managed Care – PPO | Admitting: Internal Medicine

## 2019-04-12 ENCOUNTER — Encounter: Payer: Self-pay | Admitting: Internal Medicine

## 2019-04-12 ENCOUNTER — Other Ambulatory Visit: Payer: Self-pay

## 2019-04-12 VITALS — Ht 77.0 in | Wt 300.0 lb

## 2019-04-12 DIAGNOSIS — K219 Gastro-esophageal reflux disease without esophagitis: Secondary | ICD-10-CM | POA: Diagnosis not present

## 2019-04-12 DIAGNOSIS — Z86010 Personal history of colonic polyps: Secondary | ICD-10-CM | POA: Diagnosis not present

## 2019-04-12 DIAGNOSIS — D649 Anemia, unspecified: Secondary | ICD-10-CM

## 2019-04-12 NOTE — Progress Notes (Signed)
HISTORY OF PRESENT ILLNESS:  Edward Wilson is a 58 y.o. male who is referred today by his primary provider team member Ms. Raul Del NP regarding anemia.  He schedules this WebEx audiovisual telehealth medicine visit during the coronavirus pandemic.  I last saw the patient March 11, 2015 upon referral for screening colonoscopy.  Examination was complete to the cecum with good bowel prep.  All landmarks photographed.  In addition to mild left-sided diverticulosis a diminutive a sending colon polyp was removed and found to be a tubular adenoma.  Follow-up in 5 years recommended based on current guidelines at that time.  Patient had blood work at his primary provider's office March 21, 2019.  His hemoglobin was noted to be 12.2 for which a GI consult was recommended.  The MCV was normal at 87.  The remainder of the CBC was normal.  Of interest, his hemoglobin has not changed much in recent years.  Hemoglobin in 2017 was 12.3.  Also normocytic.  Hemoccult negative at that time.  Last recorded normal hemoglobin was 2013 (13.6).  I do not see an anemia panel.  His GFR is normal.  The patient has rare episodes of reflux.  Does not take Prilosec as it is mentioned on his medication list.  He has used NSAIDs off-and-on for back and shoulder pain.  He denies melena or hematochezia.  No abdominal pain or weight loss.  Except for occasional reflux, his GI review of systems is entirely negative.  No family history of gastrointestinal malignancy.  Review of x-ray file reveals no relevant GI abnormalities.  REVIEW OF SYSTEMS:  All non-GI ROS negative unless otherwise stated in the HPI except for shoulder pain  Past Medical History:  Diagnosis Date  . Anemia   . Asthma   . GERD (gastroesophageal reflux disease)   . Hyperlipidemia   . Hypertension     History reviewed. No pertinent surgical history.  Social History Edward Wilson  reports that he has been smoking cigarettes. He has been smoking about 0.10 packs per day.  He has never used smokeless tobacco. He reports current alcohol use. He reports that he does not use drugs.  family history includes Heart attack in his mother; Heart failure in his mother.  Allergies  Allergen Reactions  . Shellfish Allergy Hives       PHYSICAL EXAMINATION: Well-appearing in no acute distress No additional physical exam information with telehealth visit  ASSESSMENT:  1.  Chronic stable normocytic anemia.  Etiology and clinical significance unclear.  Not certain what if any GI work-up will be warranted 2.  GERD.  Rare symptoms without alarm features.  Untreated 3.  History of diminutive adenoma on complete colonoscopy June 2016.  Routine follow-up planned for next year.   PLAN:  1.  BLOOD WORK including iron saturation, ferritin, B12, folate levels.  If iron deficiency, then GI work-up.  If not, return to PCP.  We will contact patient with results 2.  STOOL HEMOCCULT studies.  If positive, then GI work-up.  If not, return to PCP.  We will contact patient with results 3.  Reflux precautions This WebEx audiovisual telehealth medicine visit was initiated by and consented for by the patient who was in his home while I was in my office.  He understands her may be an associated professional charge for this service which totaled 30 minutes

## 2019-04-13 NOTE — Addendum Note (Signed)
Addended by: Audrea Muscat on: 04/13/2019 09:09 AM   Modules accepted: Orders

## 2019-04-13 NOTE — Patient Instructions (Signed)
1.  BLOOD WORK including iron saturation, ferritin, B12, folate levels.  Please come to the basement in our building on [address] between 7:30am and 4:30pm at your convenience to have these labs drawn.  If you have an iron deficiency, then we will do a GI work-up.  If not, return to PCP.  We will contact patient with results  2.  STOOL HEMOCCULT studies.  Complete the cards included and mail back.   If positive, then GI work-up.  If not, return to PCP.  We will contact patient with results  3.  Reflux precautions

## 2019-04-17 ENCOUNTER — Encounter: Payer: Self-pay | Admitting: Family Medicine

## 2019-04-17 ENCOUNTER — Other Ambulatory Visit: Payer: Self-pay

## 2019-04-17 ENCOUNTER — Ambulatory Visit: Payer: BC Managed Care – PPO | Attending: Family Medicine | Admitting: Family Medicine

## 2019-04-17 VITALS — BP 146/81 | HR 86 | Ht 77.0 in | Wt 283.8 lb

## 2019-04-17 DIAGNOSIS — Z720 Tobacco use: Secondary | ICD-10-CM

## 2019-04-17 DIAGNOSIS — I10 Essential (primary) hypertension: Secondary | ICD-10-CM | POA: Diagnosis not present

## 2019-04-17 DIAGNOSIS — J4521 Mild intermittent asthma with (acute) exacerbation: Secondary | ICD-10-CM

## 2019-04-17 MED ORDER — PREDNISONE 20 MG PO TABS: ORAL_TABLET | ORAL | 0 refills | Status: DC

## 2019-04-17 MED ORDER — ALBUTEROL SULFATE HFA 108 (90 BASE) MCG/ACT IN AERS: 2.0000 | INHALATION_SPRAY | Freq: Four times a day (QID) | RESPIRATORY_TRACT | 5 refills | Status: DC | PRN

## 2019-04-17 NOTE — Progress Notes (Signed)
HTN and med refills

## 2019-04-17 NOTE — Patient Instructions (Signed)
Asthma, Adult  Asthma is a long-term (chronic) condition in which the airways get tight and narrow. The airways are the breathing passages that lead from the nose and mouth down into the lungs. A person with asthma will have times when symptoms get worse. These are called asthma attacks. They can cause coughing, whistling sounds when you breathe (wheezing), shortness of breath, and chest pain. They can make it hard to breathe. There is no cure for asthma, but medicines and lifestyle changes can help control it. There are many things that can bring on an asthma attack or make asthma symptoms worse (triggers). Common triggers include:  Mold.  Dust.  Cigarette smoke.  Cockroaches.  Things that can cause allergy symptoms (allergens). These include animal skin flakes (dander) and pollen from trees or grass.  Things that pollute the air. These may include household cleaners, wood smoke, smog, or chemical odors.  Cold air, weather changes, and wind.  Crying or laughing hard.  Stress.  Certain medicines or drugs.  Certain foods such as dried fruit, potato chips, and grape juice.  Infections, such as a cold or the flu.  Certain medical conditions or diseases.  Exercise or tiring activities. Asthma may be treated with medicines and by staying away from the things that cause asthma attacks. Types of medicines may include:  Controller medicines. These help prevent asthma symptoms. They are usually taken every day.  Fast-acting reliever or rescue medicines. These quickly relieve asthma symptoms. They are used as needed and provide short-term relief.  Allergy medicines if your attacks are brought on by allergens.  Medicines to help control the body's defense (immune) system. Follow these instructions at home: Avoiding triggers in your home  Change your heating and air conditioning filter often.  Limit your use of fireplaces and wood stoves.  Get rid of pests (such as roaches and  mice) and their droppings.  Throw away plants if you see mold on them.  Clean your floors. Dust regularly. Use cleaning products that do not smell.  Have someone vacuum when you are not home. Use a vacuum cleaner with a HEPA filter if possible.  Replace carpet with wood, tile, or vinyl flooring. Carpet can trap animal skin flakes and dust.  Use allergy-proof pillows, mattress covers, and box spring covers.  Wash bed sheets and blankets every week in hot water. Dry them in a dryer.  Keep your bedroom free of any triggers.  Avoid pets and keep windows closed when things that cause allergy symptoms are in the air.  Use blankets that are made of polyester or cotton.  Clean bathrooms and kitchens with bleach. If possible, have someone repaint the walls in these rooms with mold-resistant paint. Keep out of the rooms that are being cleaned and painted.  Wash your hands often with soap and water. If soap and water are not available, use hand sanitizer.  Do not allow anyone to smoke in your home. General instructions  Take over-the-counter and prescription medicines only as told by your doctor. ? Talk with your doctor if you have questions about how or when to take your medicines. ? Make note if you need to use your medicines more often than usual.  Do not use any products that contain nicotine or tobacco, such as cigarettes and e-cigarettes. If you need help quitting, ask your doctor.  Stay away from secondhand smoke.  Avoid doing things outdoors when allergen counts are high and when air quality is low.  Wear a ski mask   when doing outdoor activities in the winter. The mask should cover your nose and mouth. Exercise indoors on cold days if you can.  Warm up before you exercise. Take time to cool down after exercise.  Use a peak flow meter as told by your doctor. A peak flow meter is a tool that measures how well the lungs are working.  Keep track of the peak flow meter's readings.  Write them down.  Follow your asthma action plan. This is a written plan for taking care of your asthma and treating your attacks.  Make sure you get all the shots (vaccines) that your doctor recommends. Ask your doctor about a flu shot and a pneumonia shot.  Keep all follow-up visits as told by your doctor. This is important. Contact a doctor if:  You have wheezing, shortness of breath, or a cough even while taking medicine to prevent attacks.  The mucus you cough up (sputum) is thicker than usual.  The mucus you cough up changes from clear or white to yellow, green, gray, or bloody.  You have problems from the medicine you are taking, such as: ? A rash. ? Itching. ? Swelling. ? Trouble breathing.  You need reliever medicines more than 2-3 times a week.  Your peak flow reading is still at 50-79% of your personal best after following the action plan for 1 hour.  You have a fever. Get help right away if:  You seem to be worse and are not responding to medicine during an asthma attack.  You are short of breath even at rest.  You get short of breath when doing very little activity.  You have trouble eating, drinking, or talking.  You have chest pain or tightness.  You have a fast heartbeat.  Your lips or fingernails start to turn blue.  You are light-headed or dizzy, or you faint.  Your peak flow is less than 50% of your personal best.  You feel too tired to breathe normally. Summary  Asthma is a long-term (chronic) condition in which the airways get tight and narrow. An asthma attack can make it hard to breathe.  Asthma cannot be cured, but medicines and lifestyle changes can help control it.  Make sure you understand how to avoid triggers and how and when to use your medicines. This information is not intended to replace advice given to you by your health care provider. Make sure you discuss any questions you have with your health care provider. Document  Released: 03/09/2008 Document Revised: 11/24/2018 Document Reviewed: 10/26/2016 Elsevier Patient Education  2020 [employer].  Hypertension, Adult Hypertension is another name for high blood pressure. High blood pressure forces your heart to work harder to pump blood. This can cause problems over time. There are two numbers in a blood pressure reading. There is a top number (systolic) over a bottom number (diastolic). It is best to have a blood pressure that is below 120/80. Healthy choices can help lower your blood pressure, or you may need medicine to help lower it. What are the causes? The cause of this condition is not known. Some conditions may be related to high blood pressure. What increases the risk?  Smoking.  Having type 2 diabetes mellitus, high cholesterol, or both.  Not getting enough exercise or physical activity.  Being overweight.  Having too much fat, sugar, calories, or salt (sodium) in your diet.  Drinking too much alcohol.  Having long-term (chronic) kidney disease.  Having a family history of high  blood pressure.  Age. Risk increases with age.  Race. You may be at higher risk if you are African American.  Gender. Men are at higher risk than women before age 42. After age 31, women are at higher risk than men.  Having obstructive sleep apnea.  Stress. What are the signs or symptoms?  High blood pressure may not cause symptoms. Very high blood pressure (hypertensive crisis) may cause: ? Headache. ? Feelings of worry or nervousness (anxiety). ? Shortness of breath. ? Nosebleed. ? A feeling of being sick to your stomach (nausea). ? Throwing up (vomiting). ? Changes in how you see. ? Very bad chest pain. ? Seizures. How is this treated?  This condition is treated by making healthy lifestyle changes, such as: ? Eating healthy foods. ? Exercising more. ? Drinking less alcohol.  Your health care provider may prescribe medicine if lifestyle changes  are not enough to get your blood pressure under control, and if: ? Your top number is above 130. ? Your bottom number is above 80.  Your personal target blood pressure may vary. Follow these instructions at home: Eating and drinking   If told, follow the DASH eating plan. To follow this plan: ? Fill one half of your plate at each meal with fruits and vegetables. ? Fill one fourth of your plate at each meal with whole grains. Whole grains include whole-wheat pasta, brown rice, and whole-grain bread. ? Eat or drink low-fat dairy products, such as skim milk or low-fat yogurt. ? Fill one fourth of your plate at each meal with low-fat (lean) proteins. Low-fat proteins include fish, chicken without skin, eggs, beans, and tofu. ? Avoid fatty meat, cured and processed meat, or chicken with skin. ? Avoid pre-made or processed food.  Eat less than 1,500 mg of salt each day.  Do not drink alcohol if: ? Your doctor tells you not to drink. ? You are pregnant, may be pregnant, or are planning to become pregnant.  If you drink alcohol: ? Limit how much you use to:  0-1 drink a day for women.  0-2 drinks a day for men. ? Be aware of how much alcohol is in your drink. In the [place] one drink equals one 12 oz bottle of beer (355 mL), one 5 oz glass of wine (148 mL), or one 1 oz glass of hard liquor (44 mL). Lifestyle   Work with your doctor to stay at a healthy weight or to lose weight. Ask your doctor what the best weight is for you.  Get at least 30 minutes of exercise most days of the week. This may include walking, swimming, or biking.  Get at least 30 minutes of exercise that strengthens your muscles (resistance exercise) at least 3 days a week. This may include lifting weights or doing Pilates.  Do not use any products that contain nicotine or tobacco, such as cigarettes, e-cigarettes, and chewing tobacco. If you need help quitting, ask your doctor.  Check your blood pressure at home as  told by your doctor.  Keep all follow-up visits as told by your doctor. This is important. Medicines  Take over-the-counter and prescription medicines only as told by your doctor. Follow directions carefully.  Do not skip doses of blood pressure medicine. The medicine does not work as well if you skip doses. Skipping doses also puts you at risk for problems.  Ask your doctor about side effects or reactions to medicines that you should watch for. Contact a doctor if  you:  Think you are having a reaction to the medicine you are taking.  Have headaches that keep coming back (recurring).  Feel dizzy.  Have swelling in your ankles.  Have trouble with your vision. Get help right away if you:  Get a very bad headache.  Start to feel mixed up (confused).  Feel weak or numb.  Feel faint.  Have very bad pain in your: ? Chest. ? Belly (abdomen).  Throw up more than once.  Have trouble breathing. Summary  Hypertension is another name for high blood pressure.  High blood pressure forces your heart to work harder to pump blood.  For most people, a normal blood pressure is less than 120/80.  Making healthy choices can help lower blood pressure. If your blood pressure does not get lower with healthy choices, you may need to take medicine. This information is not intended to replace advice given to you by your health care provider. Make sure you discuss any questions you have with your health care provider. Document Released: 03/09/2008 Document Revised: 06/01/2018 Document Reviewed: 06/01/2018 Elsevier Patient Education  2020 [employer].

## 2019-04-17 NOTE — Progress Notes (Signed)
Established Patient Office Visit  Subjective:  Patient ID: Edward Wilson, male    DOB: Mar 27, 1961  Age: 58 y.o. MRN: [id number]  CC:  Chief Complaint  Patient presents with  . Hypertension  . Medication Refill    HPI Edward Wilson presents for follow-up of Hypertension and he reports that he has had a recent onset of increased SOB and wheezing and needs a refill of his albuterol inhaler. He does continue to smoke. He denies any recent cough, no fever or chills. No chest pain or palpitations.  He denies any current increased nasal congestion.  No sore throat.      He states that he is currently only taking amlodipine for his blood pressure.  He states that when he was last seen here by different provider, he was not aware that another blood pressure medicine had been sent to the pharmacy and when he went to pick up his medications they were $125 which he was not expecting as he was not aware that there was an additional blood pressure medication therefore he only had the amlodipine filled.  He reports no headaches or dizziness related to his blood pressure.  He has been taking the amlodipine daily.  He does have a home blood pressure wrist monitor but he is not sure how accurate this is.  He denies any increased peripheral edema.  He is tolerating the amlodipine without difficulty and he wants to know if he does need an additional medication.  He has also been trying to decrease his sodium intake but not adding extra salt but admits that he is not really looking at the salt content of foods otherwise.  Past Medical History:  Diagnosis Date  . Anemia   . Asthma   . GERD (gastroesophageal reflux disease)   . Hyperlipidemia   . Hypertension     Past Surgical History:  Procedure Laterality Date  . NO PAST SURGERIES      Family History  Problem Relation Age of Onset  . Heart failure Mother        heart attack  . Heart attack Mother   . Colon cancer Neg Hx     Social History    Socioeconomic History  . Marital status: Single    Spouse name: Not on file  . Number of children: Not on file  . Years of education: Not on file  . Highest education level: Not on file  Occupational History  . Not on file  Social Needs  . Financial resource strain: Not on file  . Food insecurity    Worry: Not on file    Inability: Not on file  . Transportation needs    Medical: Not on file    Non-medical: Not on file  Tobacco Use  . Smoking status: Current Every Day Smoker    Packs/day: 0.10    Types: Cigarettes    Last attempt to quit: 01/25/2017    Years since quitting: 2.2  . Smokeless tobacco: Never Used  . Tobacco comment: Smoking 1-2 cigs 3 days per week  Substance and Sexual Activity  . Alcohol use: Yes    Comment: 2, 40 oz of beer per week   . Drug use: No  . Sexual activity: Not on file  Lifestyle  . Physical activity    Days per week: Not on file    Minutes per session: Not on file  . Stress: Not on file  Relationships  . Social Herbalist on  phone: Not on file    Gets together: Not on file    Attends religious service: Not on file    Active member of club or organization: Not on file    Attends meetings of clubs or organizations: Not on file    Relationship status: Not on file  . Intimate partner violence    Fear of current or ex partner: Not on file    Emotionally abused: Not on file    Physically abused: Not on file    Forced sexual activity: Not on file  Other Topics Concern  . Not on file  Social History Narrative  . Not on file    Outpatient Medications Prior to Visit  Medication Sig Dispense Refill  . amLODipine (NORVASC) 10 MG tablet Take 1 tablet (10 mg total) by mouth daily. 90 tablet 1  . atorvastatin (LIPITOR) 40 MG tablet Take 1 tablet (40 mg total) by mouth daily. 90 tablet 1  . clotrimazole-betamethasone (LOTRISONE) lotion Apply topically 2 (two) times daily. 30 mL 0  . Fluticasone-Salmeterol (ADVAIR DISKUS) 250-50  MCG/DOSE AEPB Inhale 1 puff into the lungs 2 (two) times daily. 60 each 2  . losartan (COZAAR) 50 MG tablet Take 1 tablet (50 mg total) by mouth daily. 90 tablet 3  . montelukast (SINGULAIR) 10 MG tablet Take 1 tablet (10 mg total) by mouth at bedtime. 30 tablet 11  . omeprazole (PRILOSEC) 40 MG capsule Take 1 capsule (40 mg total) by mouth daily. 30 capsule 3  . albuterol (VENTOLIN HFA) 108 (90 Base) MCG/ACT inhaler Inhale 2 puffs into the lungs every 6 (six) hours as needed for wheezing or shortness of breath. 18 g 0   No facility-administered medications prior to visit.     Allergies  Allergen Reactions  . Shellfish Allergy Hives    ROS Review of Systems  Constitutional: Positive for fatigue. Negative for chills and fever.  HENT: Negative for congestion, sore throat and trouble swallowing.   Eyes: Negative for photophobia and visual disturbance.  Respiratory: Positive for shortness of breath and wheezing. Negative for cough.   Cardiovascular: Negative for chest pain, palpitations and leg swelling.  Gastrointestinal: Negative for abdominal pain, constipation, diarrhea and nausea.  Endocrine: Negative for polydipsia, polyphagia and polyuria.  Genitourinary: Negative for dysuria and frequency.  Musculoskeletal: Positive for back pain (Occasional low back pain). Negative for gait problem.  Neurological: Negative for dizziness and headaches.  Hematological: Negative for adenopathy.      Objective:    Physical Exam  Constitutional: He is oriented to person, place, and time. He appears well-developed and well-nourished.  Large framed overweight for height/obese male in no acute distress  Neck: Normal range of motion. No JVD present.  Cardiovascular: Normal rate and regular rhythm.  Pulmonary/Chest: Effort normal. He has wheezes.  Mild expiratory wheeze in posterior lung fields  Abdominal: Soft. There is no abdominal tenderness. There is no rebound and no guarding.   Musculoskeletal:        General: Edema (Trace pretibial edema at the distal lower extremities) present. No tenderness.  Lymphadenopathy:    He has no cervical adenopathy.  Neurological: He is alert and oriented to person, place, and time.  Skin: Skin is warm and dry.  Psychiatric: He has a normal mood and affect. His behavior is normal.  Nursing note and vitals reviewed.   BP (!) 146/81 (BP Location: Left Arm, Patient Position: Sitting, Cuff Size: Large)   Pulse 86   Ht 6\' 5"  (1.956 m)  Wt 283 lb 12.8 oz (128.7 kg)   SpO2 97%   BMI 33.65 kg/m  Wt Readings from Last 3 Encounters:  04/17/19 283 lb 12.8 oz (128.7 kg)  04/12/19 300 lb (136.1 kg)  12/22/18 290 lb 12.8 oz (131.9 kg)     There are no preventive care reminders to display for this patient.  There are no preventive care reminders to display for this patient.  No results found for: TSH Lab Results  Component Value Date   WBC 7.7 03/21/2019   HGB 12.2 (L) 03/21/2019   HCT 37.0 (L) 03/21/2019   MCV 87 03/21/2019   PLT 245 03/21/2019   Lab Results  Component Value Date   NA 141 12/22/2018   K 4.1 12/22/2018   CO2 22 12/22/2018   GLUCOSE 87 12/22/2018   BUN 12 12/22/2018   CREATININE 0.85 12/22/2018   BILITOT 0.3 12/22/2018   ALKPHOS 84 12/22/2018   AST 17 12/22/2018   ALT 14 12/22/2018   PROT 7.3 12/22/2018   ALBUMIN 4.4 12/22/2018   CALCIUM 9.3 12/22/2018   Lab Results  Component Value Date   CHOL 173 03/21/2019   Lab Results  Component Value Date   HDL 67 03/21/2019   Lab Results  Component Value Date   LDLCALC 90 03/21/2019   Lab Results  Component Value Date   TRIG 79 03/21/2019   Lab Results  Component Value Date   CHOLHDL 2.6 03/21/2019   Lab Results  Component Value Date   HGBA1C 5.5 06/11/2017      Assessment & Plan:  1. Mild intermittent asthma with acute exacerbation Patient provided with refill of albuterol HFA and prednisone taper for treatment of asthma exacerbation.   Smoking cessation encouraged.  Patient should call or return if his symptoms have not improved in a few days.  Go to ED/urgent care for acute worsening of symptoms - albuterol (VENTOLIN HFA) 108 (90 Base) MCG/ACT inhaler; Inhale 2 puffs into the lungs every 6 (six) hours as needed for wheezing or shortness of breath.  Dispense: 18 g; Refill: 5 - predniSONE (DELTASONE) 20 MG tablet; Take 2 pills once daily for 2 days, then 1 pill x 2 days then 1/2 pill daily for 4 days; take after eating  Dispense: 8 tablet; Refill: 0  2. Essential hypertension Patient initially with elevated blood pressure but on repeat, blood pressure had decreased to 146/81.  Patient was advised to follow a low-sodium diet and to exercise as tolerated.  Patient also encouraged to schedule a nurse visit in approximately 2 to 3 weeks to have his blood pressure checked and to also bring his home blood pressure monitor to make sure that home monitor is accurate.  If patient's blood pressure is consistently greater than 140/90 then he may need to start an additional medication but otherwise may continue amlodipine alone along with a low-sodium diet/potassium rich diet.  3. Tobacco use Patient is encouraged to stop smoking especially in light of his asthma.  Patient will try to decrease smoking but is not ready to completely stop at this time   An After Visit Summary was printed and given to the patient.  Follow-up: Return in about 4 months (around 08/18/2019) for blood pressure-nurse visit in 2-3 weeks.   Antony Blackbird, MD

## 2019-05-01 ENCOUNTER — Ambulatory Visit: Payer: BC Managed Care – PPO

## 2019-08-18 ENCOUNTER — Ambulatory Visit: Payer: BC Managed Care – PPO | Admitting: Family Medicine

## 2019-08-18 ENCOUNTER — Other Ambulatory Visit: Payer: Self-pay

## 2019-08-18 ENCOUNTER — Ambulatory Visit: Payer: BC Managed Care – PPO | Attending: Family Medicine | Admitting: Pharmacist

## 2019-08-18 ENCOUNTER — Encounter: Payer: Self-pay | Admitting: Pharmacist

## 2019-08-18 VITALS — BP 145/79 | HR 71

## 2019-08-18 DIAGNOSIS — E7800 Pure hypercholesterolemia, unspecified: Secondary | ICD-10-CM

## 2019-08-18 DIAGNOSIS — I10 Essential (primary) hypertension: Secondary | ICD-10-CM

## 2019-08-18 DIAGNOSIS — Z23 Encounter for immunization: Secondary | ICD-10-CM | POA: Diagnosis not present

## 2019-08-18 MED ORDER — AMLODIPINE BESYLATE 10 MG PO TABS: 10.0000 mg | ORAL_TABLET | Freq: Every day | ORAL | 2 refills | Status: DC

## 2019-08-18 MED ORDER — LOSARTAN POTASSIUM 50 MG PO TABS: 50.0000 mg | ORAL_TABLET | Freq: Every day | ORAL | 2 refills | Status: DC

## 2019-08-18 MED ORDER — ATORVASTATIN CALCIUM 40 MG PO TABS: 40.0000 mg | ORAL_TABLET | Freq: Every day | ORAL | 2 refills | Status: DC

## 2019-08-18 MED FILL — ATORVASTATIN CALCIUM 40 MG: 40 | 30 days supply | Qty: 30 | Fill #0

## 2019-08-18 MED FILL — LOSARTAN POTASSIUM 50 MG TA: 50 | 30 days supply | Qty: 30 | Fill #0

## 2019-08-18 NOTE — Progress Notes (Signed)
   S:    PCP: Dr. Chapman Fitch  Patient arrives in good spirits. Presents to the clinic for hypertension evaluation, counseling, and management.  Patient was referred and last seen by Primary Care Provider on 04/17/19. BP was 146/81 at that appointment. Of note, pt endorsed compliance with amlodipine but not losartan d/t cost.   Current BP Medications include:  Amlodipine 10 mg daily but not taking losartan. Of note, he does admit to missing occasional amlodipine dose.  Dietary habits include: limits salt, limits caffeine  Exercise habits include: does not exercise  Family / Social history:  - Tobacco: reports smoking only when he drinks which is 2-3 x/wk - Alcohol: drinks 2-3, 40 oz. Beers weekly  O:  Vitals:   08/18/19 0934  BP: (!) 145/79  Pulse: 71   Home BP readings: does   Last 3 Office BP readings: BP Readings from Last 3 Encounters:  08/18/19 (!) 145/79  04/17/19 (!) 146/81  12/22/18 (!) 165/91   BMET    Component Value Date/Time   NA 141 12/22/2018 1627   K 4.1 12/22/2018 1627   CL 102 12/22/2018 1627   CO2 22 12/22/2018 1627   GLUCOSE 87 12/22/2018 1627   GLUCOSE 86 09/17/2016 1727   BUN 12 12/22/2018 1627   CREATININE 0.85 12/22/2018 1627   CREATININE 0.92 09/17/2016 1727   CALCIUM 9.3 12/22/2018 1627   GFRNONAA 95 12/22/2018 1627   GFRNONAA >89 09/17/2016 1727   GFRAA 110 12/22/2018 1627   GFRAA >89 09/17/2016 1727    Renal function: CrCl cannot be calculated (Patient's most recent lab result is older than the maximum 21 days allowed.).  Clinical ASCVD: No  The 10-year ASCVD risk score Mikey Bussing DC Jr., et al., 2013) is: 24.2%   Values used to calculate the score:     Age: 58 years     Sex: Male     Is Non-Hispanic African American: Yes     Diabetic: No     Tobacco smoker: Yes     Systolic Blood Pressure: [id number] mmHg     Is BP treated: Yes     HDL Cholesterol: 67 mg/dL     Total Cholesterol: 173 mg/dL  A/P: Hypertension longstanding currently uncontrolled  on current medications. BP Goal = <130/80 mmHg. Patient is adherent to amlodipine but is not taking losartan.  -Continued amlodipine. -Add losartan 50 mg daily  -F/u labs ordered - pt should have BMP at f/u within 1 month -Counseled on lifestyle modifications for blood pressure control including reduced dietary sodium, increased exercise, adequate sleep -HM: flu shot given  Results reviewed and written information provided.   Total time in face-to-face counseling 15 minutes.   F/U Clinic Visit with PCP.    Patient seen with: Lesle Reek, PharmD Candidate [employer]  Benard Halsted, PharmD, [place] [phone]

## 2019-08-18 NOTE — Patient Instructions (Signed)
Thank you for coming to see [place] today.   Blood pressure today is above goal.  Continue taking blood pressure medications as prescribed. Pick up the medications from out pharmacy.   Limiting salt and caffeine, as well as exercising as able for at least 30 minutes for 5 days out of the week, can also help you lower your blood pressure.  Take your blood pressure at home if you are able. Please write down these numbers and bring them to your visits.  If you have any questions about medications, please call me [phone].  Lurena Joiner

## 2019-09-08 ENCOUNTER — Other Ambulatory Visit: Payer: Self-pay

## 2019-09-08 ENCOUNTER — Ambulatory Visit: Payer: BC Managed Care – PPO | Attending: Family Medicine | Admitting: Family Medicine

## 2019-09-08 DIAGNOSIS — I10 Essential (primary) hypertension: Secondary | ICD-10-CM | POA: Diagnosis not present

## 2019-09-08 DIAGNOSIS — Z720 Tobacco use: Secondary | ICD-10-CM | POA: Diagnosis not present

## 2019-09-08 DIAGNOSIS — R071 Chest pain on breathing: Secondary | ICD-10-CM

## 2019-09-08 DIAGNOSIS — J4540 Moderate persistent asthma, uncomplicated: Secondary | ICD-10-CM | POA: Diagnosis not present

## 2019-09-08 MED ORDER — LOSARTAN POTASSIUM 50 MG PO TABS: 50.0000 mg | ORAL_TABLET | Freq: Every day | ORAL | 4 refills | Status: DC

## 2019-09-08 MED ORDER — AMLODIPINE BESYLATE 10 MG PO TABS: 10.0000 mg | ORAL_TABLET | Freq: Every day | ORAL | 4 refills | Status: DC

## 2019-09-08 NOTE — Progress Notes (Signed)
Virtual Visit via Telephone Note  I connected with Edward Wilson on 09/08/19 at  9:10 AM EST by telephone and verified that I am speaking with the correct person using two identifiers.   I discussed the limitations, risks, security and privacy concerns of performing an evaluation and management service by telephone and the availability of in person appointments. I also discussed with the patient that there may be a patient responsible charge related to this service. The patient expressed understanding and agreed to proceed.  Patient Location: Home Provider Location: [facility] Others participating in call: none   History of Present Illness:          58 year old male seen in follow-up of chronic medical issues including hypertension, asthma, hyperlipidemia, acid reflux and patient with continued complaint of left lateral chest wall pain which is associated with taking a deep breath.  Patient was seen for this issue on 03/06/2019 by another provider via telehealth visit.  Patient's chest wall discomfort was thought to be musculoskeletal in origin.  Patient also had abnormal CBC at the last visit and was referred for further evaluation.  Patient was seen 04/12/2019 by GI.          Today, patient has complaint of continued episodes of discomfort with taking deep breaths or sometimes even with inhalation during normal breaths of discomfort in the lateral left chest below the armpit.  He denies any cough or shortness of breath otherwise other than occasionally awaking with sensation of needing to cough or some chest tightness for which he uses his albuterol inhaler.  He reports that he is not currently using Advair or taking Singulair.  He states that he was not even aware that Singulair had been prescribed.  He is using his albuterol inhaler on a daily basis.  Patient continues to smoke but states that he only smokes when he drinks.  Patient when questioned states that he drinks a beer about 3-4 times per  week.  He initially stated that he started smoking about 5 to 6 years ago but then clarified that he smoked for many years when he was younger than quit but then restarted smoking whenever he would drink about 5 to 6 years ago.         He reports that he is taking his blood pressure medicine and denies headaches or dizziness related to his blood pressure.  He is currently on amlodipine and losartan without any side effects.  Patient additionally is not taking his cholesterol medication.  He states that he never picked this up from the pharmacy, this was also prescribed at his last visit.  He denies any palpitations.  He has occasional mild cough which is nonproductive, some nighttime awakening secondary to sensation of shortness of breath, and chest tightness.  Some shortness of breath with exertion.  He does have frequent use of his albuterol inhaler.  He denies any abdominal pain-no nausea/vomiting/diarrhea or constipation.  No blood in the stool and no black stools.  No urinary frequency or dysuria.  No increased muscle or joint pain.  No peripheral edema.                    Past Medical History:  Diagnosis Date  . Anemia   . Asthma   . GERD (gastroesophageal reflux disease)   . Hyperlipidemia   . Hypertension     Past Surgical History:  Procedure Laterality Date  . NO PAST SURGERIES      Family History  Problem Relation Age of Onset  . Heart failure Mother        heart attack  . Heart attack Mother   . Colon cancer Neg Hx     Social History   Tobacco Use  . Smoking status: Current Every Day Smoker    Packs/day: 0.10    Types: Cigarettes    Last attempt to quit: 01/25/2017    Years since quitting: 2.6  . Smokeless tobacco: Never Used  . Tobacco comment: Smoking 1-2 cigs 3 days per week  Substance Use Topics  . Alcohol use: Yes    Comment: 2, 40 oz of beer per week   . Drug use: No     Allergies  Allergen Reactions  . Shellfish Allergy Hives        Observations/Objective: No vital signs or physical exam conducted as visit was done via telephone  Assessment and Plan: 1. Essential hypertension He reports that his blood pressure has been controlled on his current medications of amlodipine and losartan which are being refilled at today's visit.  He is encouraged to follow a low-sodium diet and to exercise on a regular basis and maintain a healthy weight as this will also help with control of his blood pressure - losartan (COZAAR) 50 MG tablet; Take 1 tablet (50 mg total) by mouth daily.  Dispense: 30 tablet; Refill: 4 - amLODipine (NORVASC) 10 MG tablet; Take 1 tablet (10 mg total) by mouth daily.  Dispense: 30 tablet; Refill: 4  2. Moderate persistent asthma without complication; 4.  Tobacco use Patient has moderate persistent asthma and does not use his Advair discus which was previously prescribed.  Patient states that he uses his albuterol as needed but does describe having daily asthma symptoms.  Patient also continues to smoke and patient was counseled for approximately 5 minutes regarding the side effects of smoking as well as on smoking cessation.  Patient declines referral to clinical pharmacist to help with smoking cessation at this time as he states that he only smokes a few times per week.  He will be referred to pulmonology in follow-up of his moderate persistent asthma as well as his recurrent complaint of lateral left chest discomfort with deep breaths.  He has also been asked to have chest x-ray done.  Nurse visit was offered to receive influenza immunization which patient declined. - DG Chest 2 View; Future - Ambulatory referral to Pulmonology  3. Chest pain varying with breathing Patient reports recurrent issues with left lateral chest wall pain/pain below the left axilla that can occur with deep breaths.  On review of his last visit note, he told the provider that the chest pain had resolved but patient now again complains of  the same sensation.  He has been asked to obtain a chest x-ray and he will also follow-up with pulmonology.  Smoking cessation was also discussed.   - DG Chest 2 View; Future - Ambulatory referral to Pulmonology    Follow Up Instructions:Return in about 4 months (around 01/07/2020) for Hypertension: Keep follow-up with pulmonology and obtain chest x-ray; return sooner if needed.    I discussed the assessment and treatment plan with the patient. The patient was provided an opportunity to ask questions and all were answered. The patient agreed with the plan and demonstrated an understanding of the instructions.   The patient was advised to call back or seek an in-person evaluation if the symptoms worsen or if the condition fails to improve as anticipated.  I  provided 16 minutes of non-face-to-face time during this encounter.   Antony Blackbird, MD

## 2019-09-08 NOTE — Progress Notes (Signed)
Patient verified DOB Patient has not taken medication. Patient has not eaten today. Patient complains of pain intermittently on the left side for over a year.

## 2019-09-09 ENCOUNTER — Encounter: Payer: Self-pay | Admitting: Family Medicine

## 2019-11-09 ENCOUNTER — Ambulatory Visit: Payer: BC Managed Care – PPO | Attending: Family Medicine | Admitting: Family Medicine

## 2019-11-09 ENCOUNTER — Other Ambulatory Visit: Payer: Self-pay | Admitting: Pharmacist

## 2019-11-09 ENCOUNTER — Other Ambulatory Visit: Payer: Self-pay

## 2019-11-09 ENCOUNTER — Encounter: Payer: Self-pay | Admitting: Family Medicine

## 2019-11-09 VITALS — BP 159/83 | HR 69 | Temp 98.0°F | Resp 20 | Wt 296.0 lb

## 2019-11-09 DIAGNOSIS — D649 Anemia, unspecified: Secondary | ICD-10-CM

## 2019-11-09 DIAGNOSIS — J4540 Moderate persistent asthma, uncomplicated: Secondary | ICD-10-CM | POA: Diagnosis not present

## 2019-11-09 DIAGNOSIS — E7800 Pure hypercholesterolemia, unspecified: Secondary | ICD-10-CM

## 2019-11-09 DIAGNOSIS — I10 Essential (primary) hypertension: Secondary | ICD-10-CM

## 2019-11-09 DIAGNOSIS — Z860100 Personal history of colon polyps, unspecified: Secondary | ICD-10-CM

## 2019-11-09 DIAGNOSIS — E669 Obesity, unspecified: Secondary | ICD-10-CM

## 2019-11-09 DIAGNOSIS — Z6835 Body mass index (BMI) 35.0-35.9, adult: Secondary | ICD-10-CM

## 2019-11-09 DIAGNOSIS — R350 Frequency of micturition: Secondary | ICD-10-CM | POA: Diagnosis not present

## 2019-11-09 MED ORDER — FLUTICASONE PROPIONATE HFA 220 MCG/ACT IN AERO: 1.0000 | INHALATION_SPRAY | Freq: Two times a day (BID) | RESPIRATORY_TRACT | 0 refills | Status: DC

## 2019-11-09 MED ORDER — FLOVENT HFA 220 MCG/ACT IN AERO: 1.0000 | INHALATION_SPRAY | Freq: Two times a day (BID) | RESPIRATORY_TRACT | 0 refills | Status: DC

## 2019-11-09 MED FILL — FLOVENT HFA 220 MCG INHALER: 220 | 30 days supply | Qty: 12 | Fill #0

## 2019-11-09 NOTE — Patient Instructions (Signed)
Try over the counter, Cetaphil or Eucerin but obtain the generic or store brand to help with your dry skin

## 2019-11-09 NOTE — Progress Notes (Signed)
Established Patient Office Visit  Subjective:  Patient ID: Edward Wilson, male    DOB: [birth year]  Age: 59 y.o. MRN: [id number]  CC:  Chief Complaint  Patient presents with  . Follow-up    HPI Edward Wilson, 59 year old male seen in follow-up of chronic medical issues including hypertension, hyperlipidemia, history of anemia and patient with complaint of continued issues with his asthma.  He is not taking Advair at this time as he reports that it made him feel funny.  He continues to take albuterol and has to use this at least 2-3 times per day due to sensation of shortness of breath and chest tightness.  He has increased shortness of breath with activity.  He will also have increased in sensation of chest pressure and onset of wheezing if he is active.  He also has some nighttime awakening secondary to the sensation of chest tightness, shortness of breath.  He denies any current productive cough or lung infection.  He has had no recent fever or chills.  No increase in nasal congestion, sore throat or postnasal drainage.       He reports that he is taking his blood pressure medication on a daily basis.  He is not interested in increasing/changing medications at this time.  He denies any headaches or dizziness related to his blood pressure.  He is not taking his cholesterol medication at this time.  He also continues to have issues with chronic left shoulder pain which he reports is about a 7-8 on a 0-to-10 scale and he has difficulty sleeping on that shoulder due to the shoulder pain.  He does have an orthopedic doctor with whom he can follow-up with as he has had prior right shoulder rotator cuff tear.  He reports some occasional fatigue and has been anemic in the past.  He denies any current abdominal pain, no blood in the stool and no black stools.  He has had prior colonoscopy with removal of a polyp.  Past Medical History:  Diagnosis Date  . Anemia   . Asthma   . GERD (gastroesophageal reflux  disease)   . Hyperlipidemia   . Hypertension     Past Surgical History:  Procedure Laterality Date  . NO PAST SURGERIES      Family History  Problem Relation Age of Onset  . Heart failure Mother        heart attack  . Heart attack Mother   . Colon cancer Neg Hx     Social History   Socioeconomic History  . Marital status: Single    Spouse name: Not on file  . Number of children: Not on file  . Years of education: Not on file  . Highest education level: Not on file  Occupational History  . Not on file  Tobacco Use  . Smoking status: Current Every Day Smoker    Packs/day: 0.10    Types: Cigarettes    Last attempt to quit: 01/25/2017    Years since quitting: 2.7  . Smokeless tobacco: Never Used  . Tobacco comment: Smoking 1-2 cigs 3 days per week  Substance and Sexual Activity  . Alcohol use: Yes    Comment: 2, 40 oz of beer per week   . Drug use: No  . Sexual activity: Not on file  Other Topics Concern  . Not on file  Social History Narrative  . Not on file   Social Determinants of Health   Financial Resource Strain:   . Difficulty  of Paying Living Expenses: Not on file  Food Insecurity:   . Worried About Charity fundraiser in the Last Year: Not on file  . Ran Out of Food in the Last Year: Not on file  Transportation Needs:   . Lack of Transportation (Medical): Not on file  . Lack of Transportation (Non-Medical): Not on file  Physical Activity:   . Days of Exercise per Week: Not on file  . Minutes of Exercise per Session: Not on file  Stress:   . Feeling of Stress : Not on file  Social Connections:   . Frequency of Communication with Friends and Family: Not on file  . Frequency of Social Gatherings with Friends and Family: Not on file  . Attends Religious Services: Not on file  . Active Member of Clubs or Organizations: Not on file  . Attends Archivist Meetings: Not on file  . Marital Status: Not on file  Intimate Partner Violence:   .  Fear of Current or Ex-Partner: Not on file  . Emotionally Abused: Not on file  . Physically Abused: Not on file  . Sexually Abused: Not on file    Outpatient Medications Prior to Visit  Medication Sig Dispense Refill  . albuterol (VENTOLIN HFA) 108 (90 Base) MCG/ACT inhaler Inhale 2 puffs into the lungs every 6 (six) hours as needed for wheezing or shortness of breath. 18 g 5  . amLODipine (NORVASC) 10 MG tablet Take 1 tablet (10 mg total) by mouth daily. 30 tablet 4  . losartan (COZAAR) 50 MG tablet Take 1 tablet (50 mg total) by mouth daily. 30 tablet 4  . atorvastatin (LIPITOR) 40 MG tablet Take 1 tablet (40 mg total) by mouth daily. (Patient not taking: Reported on 11/09/2019) 30 tablet 2  . clotrimazole-betamethasone (LOTRISONE) lotion Apply topically 2 (two) times daily. (Patient not taking: Reported on 11/09/2019) 30 mL 0  . Fluticasone-Salmeterol (ADVAIR DISKUS) 250-50 MCG/DOSE AEPB Inhale 1 puff into the lungs 2 (two) times daily. (Patient not taking: Reported on 11/09/2019) 60 each 2  . montelukast (SINGULAIR) 10 MG tablet Take 1 tablet (10 mg total) by mouth at bedtime. (Patient not taking: Reported on 11/09/2019) 30 tablet 11  . omeprazole (PRILOSEC) 40 MG capsule Take 1 capsule (40 mg total) by mouth daily. (Patient not taking: Reported on 11/09/2019) 30 capsule 3   No facility-administered medications prior to visit.    Allergies  Allergen Reactions  . Shellfish Allergy Hives    ROS Review of Systems  Constitutional: Positive for fatigue (Occasional). Negative for chills and fever.  HENT: Negative for sore throat and trouble swallowing.   Eyes: Negative for photophobia and visual disturbance.  Respiratory: Negative for cough and shortness of breath.   Cardiovascular: Negative for chest pain and palpitations.  Gastrointestinal: Negative for abdominal pain, blood in stool, constipation, diarrhea and nausea.  Endocrine: Positive for polyuria. Negative for cold intolerance, heat  intolerance, polydipsia and polyphagia.  Genitourinary: Positive for frequency. Negative for difficulty urinating, dysuria and flank pain.  Musculoskeletal: Positive for arthralgias. Negative for back pain.  Skin: Positive for rash (Chronic). Negative for wound.  Neurological: Negative for dizziness and headaches.  Hematological: Negative for adenopathy. Does not bruise/bleed easily.      Objective:    Physical Exam  Constitutional: He is oriented to person, place, and time. He appears well-developed and well-nourished.  Well-nourished well-developed larger framed, obese older male in no acute distress  Neck: No JVD present.  Cardiovascular: Normal rate  and regular rhythm.  Pulmonary/Chest: Effort normal and breath sounds normal.  Mild decrease in air movement, no increased work of breathing or accessory muscle use  Abdominal: Soft. There is no abdominal tenderness. There is no rebound and no guarding.  Musculoskeletal:        General: Tenderness present. No edema.     Cervical back: Normal range of motion and neck supple.     Comments: Left shoulder with tenderness to palpation of the anterior lateral and posterior lateral aspect and positive empty can sign  Lymphadenopathy:    He has no cervical adenopathy.  Neurological: He is alert and oriented to person, place, and time.  Skin: Skin is warm and dry.  Areas of dry flaky skin on the upper extremities and lower legs  Psychiatric: He has a normal mood and affect. His behavior is normal.  Nursing note and vitals reviewed.   BP (!) 159/83   Pulse 69   Temp 98 F (36.7 C)   Resp 20   Wt 296 lb (134.3 kg)   SpO2 96%   BMI 35.10 kg/m  Wt Readings from Last 3 Encounters:  11/09/19 296 lb (134.3 kg)  04/17/19 283 lb 12.8 oz (128.7 kg)  04/12/19 300 lb (136.1 kg)    There are no preventive care reminders to display for this patient.  No results found for: TSH Lab Results  Component Value Date   WBC 7.7 03/21/2019   HGB  12.2 (L) 03/21/2019   HCT 37.0 (L) 03/21/2019   MCV 87 03/21/2019   PLT 245 03/21/2019   Lab Results  Component Value Date   NA 141 12/22/2018   K 4.1 12/22/2018   CO2 22 12/22/2018   GLUCOSE 87 12/22/2018   BUN 12 12/22/2018   CREATININE 0.85 12/22/2018   BILITOT 0.3 12/22/2018   ALKPHOS 84 12/22/2018   AST 17 12/22/2018   ALT 14 12/22/2018   PROT 7.3 12/22/2018   ALBUMIN 4.4 12/22/2018   CALCIUM 9.3 12/22/2018   Lab Results  Component Value Date   CHOL 173 03/21/2019   Lab Results  Component Value Date   HDL 67 03/21/2019   Lab Results  Component Value Date   LDLCALC 90 03/21/2019   Lab Results  Component Value Date   TRIG 79 03/21/2019   Lab Results  Component Value Date   CHOLHDL 2.6 03/21/2019   Lab Results  Component Value Date   HGBA1C 5.5 06/11/2017      Assessment & Plan:  1. Essential hypertension Blood pressure is elevated at today's visit but patient is not interested in changing his blood pressure medications at this time.  He has been asked to continue the use of losartan 50 mg and amlodipine 10 mg daily.  Continue low-sodium diet and exercise as tolerated once asthma is improved. - Basic Metabolic Panel  2. Moderate persistent chronic asthma without complication He believes that he was on Flovent in the past therefore will have patient start Flovent 220 mcg in addition to albuterol as needed.  He has been asked to follow-up in the next 4 weeks if his asthma has not improved.  He reports that he did try Advair in the past however this made him feel bad and he discontinued use.  3. Anemia, unspecified type CBC in follow-up of prior anemia. - CBC  4. History of colon polyps Review of chart, patient with prior adenomatous polyp removed during colonoscopy on 03/21/2015 by Dr. Scarlette Shorts.  Patient will be referred  to gastroenterology as 5-year repeat colonoscopy was recommended. - Ambulatory referral to Gastroenterology  5. Urinary  frequency Patient with complaint of urinary frequency for which basic metabolic panel will be done at today's visit to look for elevated glucose or renal abnormality and will have patient leave sample for urinalysis to look for possible urinary tract infection as a cause of his urinary frequency. - Basic Metabolic Panel - POCT URINALYSIS DIP (CLINITEK)  6. Pure hypercholesterolemia He has been asked to return for fasting lipid panel in follow-up of hyperlipidemia. Low fat diet recommended. He is not currently taking his atorvastatin but will make further recommendations once he repeats his lipid panel.  - Lipid Panel; Future  -Discussed use of Eucerin or other moisturizing/hydrating cream to areas of dry skin.  Patient encouraged to schedule follow-up with his orthopedic doctor regarding his left shoulder pain.   An After Visit Summary was printed and given to the patient.   Follow-up: Return in 4 months (on 03/08/2020) for chronic issues- sooner if asthma not better; fasting labs in 2-3 weeks.   Antony Blackbird, MD

## 2019-11-09 NOTE — Progress Notes (Signed)
Has to use Albuterol more frequently Does not care for advair  And does not take cholesterol medication   Left shoulder pain 7/10

## 2019-11-10 LAB — BASIC METABOLIC PANEL WITH GFR
BUN/Creatinine Ratio: 13 (ref 10–24)
BUN: 13 mg/dL (ref 8–27)
CO2: 23 mmol/L (ref 20–29)
Calcium: 9.5 mg/dL (ref 8.6–10.2)
Chloride: 105 mmol/L (ref 96–106)
Creatinine, Ser: 0.98 mg/dL (ref 0.76–1.27)
GFR calc Af Amer: 96 mL/min/1.73
GFR calc non Af Amer: 83 mL/min/1.73
Glucose: 83 mg/dL (ref 65–99)
Potassium: 4.6 mmol/L (ref 3.5–5.2)
Sodium: 142 mmol/L (ref 134–144)

## 2019-11-10 LAB — CBC
Hematocrit: 37.4 % — ABNORMAL LOW (ref 37.5–51.0)
Hemoglobin: 12.7 g/dL — ABNORMAL LOW (ref 13.0–17.7)
MCH: 29.4 pg (ref 26.6–33.0)
MCHC: 34 g/dL (ref 31.5–35.7)
MCV: 87 fL (ref 79–97)
Platelets: 254 x10E3/uL (ref 150–450)
RBC: 4.32 x10E6/uL (ref 4.14–5.80)
RDW: 11.9 % (ref 11.6–15.4)
WBC: 8.4 x10E3/uL (ref 3.4–10.8)

## 2019-11-15 ENCOUNTER — Telehealth (INDEPENDENT_AMBULATORY_CARE_PROVIDER_SITE_OTHER): Payer: Self-pay

## 2019-11-15 NOTE — Telephone Encounter (Signed)
-----   Message from Antony Blackbird, MD sent at 11/14/2019  3:12 PM EST ----- Mild anemia on complete blood count with hemoglobin of 12.7 and normal of 13-17.7.  Keep upcoming follow-up with gastroenterology for colonoscopy or call if this needs to be scheduled.  Normal electrolytes/renal function on basic metabolic panel

## 2019-11-15 NOTE — Telephone Encounter (Signed)
Patient verified date of birth. He is aware of Mild anemia on complete blood count with hemoglobin of 12.7 and normal of 13-17.7. Keep upcoming follow-up with gastroenterology for colonoscopy or call if this needs to be scheduled. Normal electrolytes/renal function on basic metabolic panel. Patient is not due for colonoscopy until June. Gertie Fey will call him closer to that time to schedule. He verbalized understanding of results. Nat Christen, CMA

## 2019-12-01 ENCOUNTER — Ambulatory Visit: Payer: BC Managed Care – PPO | Admitting: Family Medicine

## 2019-12-21 ENCOUNTER — Institutional Professional Consult (permissible substitution): Payer: BC Managed Care – PPO | Admitting: Internal Medicine

## 2019-12-21 ENCOUNTER — Other Ambulatory Visit: Payer: Self-pay

## 2019-12-25 ENCOUNTER — Encounter (HOSPITAL_COMMUNITY): Payer: Self-pay | Admitting: Emergency Medicine

## 2019-12-25 ENCOUNTER — Emergency Department (HOSPITAL_COMMUNITY)
Admission: EM | Admit: 2019-12-25 | Discharge: 2019-12-25 | Disposition: A | Discharge status: Home / Self Care | Payer: BC Managed Care – PPO | Attending: Emergency Medicine | Admitting: Emergency Medicine

## 2019-12-25 ENCOUNTER — Telehealth: Payer: Self-pay

## 2019-12-25 ENCOUNTER — Emergency Department (HOSPITAL_COMMUNITY): Payer: BC Managed Care – PPO

## 2019-12-25 DIAGNOSIS — F17210 Nicotine dependence, cigarettes, uncomplicated: Secondary | ICD-10-CM | POA: Diagnosis not present

## 2019-12-25 DIAGNOSIS — M79672 Pain in left foot: Secondary | ICD-10-CM | POA: Diagnosis not present

## 2019-12-25 DIAGNOSIS — L089 Local infection of the skin and subcutaneous tissue, unspecified: Secondary | ICD-10-CM | POA: Diagnosis not present

## 2019-12-25 DIAGNOSIS — Z79899 Other long term (current) drug therapy: Secondary | ICD-10-CM | POA: Insufficient documentation

## 2019-12-25 DIAGNOSIS — I10 Essential (primary) hypertension: Secondary | ICD-10-CM | POA: Insufficient documentation

## 2019-12-25 DIAGNOSIS — L03116 Cellulitis of left lower limb: Secondary | ICD-10-CM | POA: Diagnosis not present

## 2019-12-25 DIAGNOSIS — R2242 Localized swelling, mass and lump, left lower limb: Secondary | ICD-10-CM | POA: Diagnosis not present

## 2019-12-25 LAB — COMPREHENSIVE METABOLIC PANEL
ALT: 14 U/L (ref 0–44)
AST: 14 U/L — ABNORMAL LOW (ref 15–41)
Albumin: 3.4 g/dL — ABNORMAL LOW (ref 3.5–5.0)
Alkaline Phosphatase: 78 U/L (ref 38–126)
Anion gap: 7 (ref 5–15)
BUN: 8 mg/dL (ref 6–20)
CO2: 25 mmol/L (ref 22–32)
Calcium: 9 mg/dL (ref 8.9–10.3)
Chloride: 106 mmol/L (ref 98–111)
Creatinine, Ser: 0.92 mg/dL (ref 0.61–1.24)
GFR calc Af Amer: >60 mL/min (ref >60)
GFR calc non Af Amer: >60 mL/min (ref >60)
Glucose, Bld: 135 mg/dL — ABNORMAL HIGH (ref 70–99)
Potassium: 3.9 mmol/L (ref 3.5–5.1)
Sodium: 138 mmol/L (ref 135–145)
Total Bilirubin: 0.7 mg/dL (ref 0.3–1.2)
Total Protein: 7.3 g/dL (ref 6.5–8.1)

## 2019-12-25 LAB — CBC
HCT: 40.5 % (ref 39.0–52.0)
Hemoglobin: 12.6 g/dL — ABNORMAL LOW (ref 13.0–17.0)
MCH: 28.4 pg (ref 26.0–34.0)
MCHC: 31.1 g/dL (ref 30.0–36.0)
MCV: 91.4 fL (ref 80.0–100.0)
Platelets: 308 10*3/uL (ref 150–400)
RBC: 4.43 MIL/uL (ref 4.22–5.81)
RDW: 13.1 % (ref 11.5–15.5)
WBC: 9 10*3/uL (ref 4.0–10.5)
nRBC: 0 % (ref 0.0–0.2)

## 2019-12-25 LAB — LACTIC ACID, PLASMA: Lactic Acid, Venous: 1.1 mmol/L (ref 0.5–1.9)

## 2019-12-25 MED ORDER — CLINDAMYCIN HCL 150 MG PO CAPS
450.0000 mg | ORAL_CAPSULE | Freq: Once | ORAL | Status: AC
  Administered 2019-12-25: 14:00:00 450 mg via ORAL
  Filled 2019-12-25: qty 3

## 2019-12-25 MED ORDER — CLINDAMYCIN HCL 150 MG PO CAPS: 450.0000 mg | ORAL_CAPSULE | Freq: Three times a day (TID) | ORAL | 0 refills | Status: DC

## 2019-12-25 NOTE — ED Triage Notes (Signed)
Pt here with c/o left swollen warm red foot , pt has a wound to the out of foot and discolored toes

## 2019-12-25 NOTE — Telephone Encounter (Signed)
Call received from [facility] noting that the patient is in the ED and needs referral to dermatology but they are not able to make one from the ED.

## 2019-12-25 NOTE — Care Management (Signed)
RNCM contacted [facility] to obtain dermatology referral.

## 2019-12-25 NOTE — ED Provider Notes (Signed)
[facility] EMERGENCY DEPARTMENT Provider Note   CSN: [id number] Arrival date & time: 12/25/19  1020     History No chief complaint on file.   Edward Wilson is a 59 y.o. male.  The history is provided by the patient and medical records. No language interpreter was used.   Edward Wilson is a 59 y.o. male who presents to the Emergency Department complaining of foot swelling. He presents the emergency department complaining of two days of swelling to the left foot. He has a history of chronic issues with the skin on his feet. He noticed over the weekend that the left foot began to swell and crack. He does have increased pain when he ambulates on the left foot. He denies any fevers, chills, chest pain, shortness of breath, nausea, vomiting. He has been treating his foot at home with athlete's foot spray. He reports having a similar episode in the past requiring antibiotics.    Past Medical History:  Diagnosis Date  . Anemia   . Asthma   . GERD (gastroesophageal reflux disease)   . Hyperlipidemia   . Hypertension     Patient Active Problem List   Diagnosis Date Noted  . Supraspinatus tendon tear, right, initial encounter 11/01/2017  . Infraspinatus tendon tear, right, initial encounter 11/01/2017  . Hand dermatitis 02/01/2017  . Pure hypercholesterolemia 09/18/2016  . Asthma, chronic 08/07/2016  . History of syphilis 10/14/2015  . Essential hypertension 02/12/2015  . Arthralgia 02/12/2015  . Tobacco use disorder 02/12/2015  . Alcohol use 02/12/2015    Past Surgical History:  Procedure Laterality Date  . NO PAST SURGERIES         Family History  Problem Relation Age of Onset  . Heart failure Mother        heart attack  . Heart attack Mother   . Colon cancer Neg Hx     Social History   Tobacco Use  . Smoking status: Current Every Day Smoker    Packs/day: 0.10    Types: Cigarettes    Last attempt to quit: 01/25/2017    Years since quitting: 2.9   . Smokeless tobacco: Never Used  . Tobacco comment: Smoking 1-2 cigs 3 days per week  Substance Use Topics  . Alcohol use: Yes    Comment: 2, 40 oz of beer per week   . Drug use: No    Home Medications Prior to Admission medications   Medication Sig Start Date End Date Taking? Authorizing Provider  albuterol (VENTOLIN HFA) 108 (90 Base) MCG/ACT inhaler Inhale 2 puffs into the lungs every 6 (six) hours as needed for wheezing or shortness of breath. 04/17/19   Fulp, Cammie, MD  amLODipine (NORVASC) 10 MG tablet Take 1 tablet (10 mg total) by mouth daily. 09/08/19 12/07/19  Fulp, Cammie, MD  atorvastatin (LIPITOR) 40 MG tablet Take 1 tablet (40 mg total) by mouth daily. Patient not taking: Reported on 11/09/2019 08/18/19 11/16/19  Fulp, Ander Gaster, MD  clindamycin (CLEOCIN) 150 MG capsule Take 3 capsules (450 mg total) by mouth 3 (three) times daily. 12/25/19   Quintella Reichert, MD  clotrimazole-betamethasone (LOTRISONE) lotion Apply topically 2 (two) times daily. Patient not taking: Reported on 11/09/2019 12/22/18   Argentina Donovan, PA-C  fluticasone (FLOVENT HFA) 220 MCG/ACT inhaler Inhale 1 puff into the lungs 2 (two) times daily. 11/09/19   Fulp, Cammie, MD  losartan (COZAAR) 50 MG tablet Take 1 tablet (50 mg total) by mouth daily. 09/08/19   Fulp, Cammie,  MD  montelukast (SINGULAIR) 10 MG tablet Take 1 tablet (10 mg total) by mouth at bedtime. Patient not taking: Reported on 11/09/2019 12/22/18   Argentina Donovan, PA-C  omeprazole (PRILOSEC) 40 MG capsule Take 1 capsule (40 mg total) by mouth daily. Patient not taking: Reported on 11/09/2019 03/06/19   Gildardo Pounds, NP    Allergies    Shellfish allergy  Review of Systems   Review of Systems  All other systems reviewed and are negative.   Physical Exam Updated Vital Signs BP 125/77 (BP Location: Left Arm)   Pulse 98   Temp 98.8 F (37.1 C) (Oral)   Resp 16   Ht 6\' 5"  (1.956 m)   Wt 136.1 kg   BMI 35.57 kg/m   Physical Exam Vitals and  nursing note reviewed.  Constitutional:      Appearance: He is well-developed.  HENT:     Head: Normocephalic and atraumatic.  Cardiovascular:     Rate and Rhythm: Normal rate and regular rhythm.     Heart sounds: No murmur.  Pulmonary:     Effort: Pulmonary effort is normal. No respiratory distress.     Breath sounds: Normal breath sounds.  Abdominal:     Palpations: Abdomen is soft.     Tenderness: There is no abdominal tenderness. There is no guarding or rebound.  Musculoskeletal:        General: No tenderness.     Comments: 2+ DP pulses bilaterally. One plus edema to the left foot, trace edema to the right foot. There are multiple blisters to the dorsal implant her left foot in various stages of healing. There is a very small area of weeping from left great toe. There are several separate erythematous macules to the ankle.  Skin:    General: Skin is warm and dry.  Neurological:     Mental Status: He is alert and oriented to person, place, and time.  Psychiatric:        Behavior: Behavior normal.       ED Results / Procedures / Treatments   Labs (all labs ordered are listed, but only abnormal results are displayed) Labs Reviewed  CBC - Abnormal; Notable for the following components:      Result Value   Hemoglobin 12.6 (*)    All other components within normal limits  COMPREHENSIVE METABOLIC PANEL - Abnormal; Notable for the following components:   Glucose, Bld 135 (*)    Albumin 3.4 (*)    AST 14 (*)    All other components within normal limits  LACTIC ACID, PLASMA    EKG None  Radiology DG Foot Complete Left  Result Date: 12/25/2019 CLINICAL DATA:  Foot infection. EXAM: LEFT FOOT - COMPLETE 3+ VIEW COMPARISON:  None. FINDINGS: There is no evidence of fracture or dislocation. There is no evidence of arthropathy or other focal bone abnormality. Small plantar heel spur. Arterial calcification. IMPRESSION: No acute osseous finding or opaque foreign body.  Electronically Signed   By: Monte Fantasia M.D.   On: 12/25/2019 11:35    Procedures Procedures (including critical care time)  Medications Ordered in ED Medications  clindamycin (CLEOCIN) capsule 450 mg (has no administration in time range)    ED Course  I have reviewed the triage vital signs and the nursing notes.  Pertinent labs & imaging results that were available during my care of the patient were reviewed by me and considered in my medical decision making (see chart for details).  MDM Rules/Calculators/A&P                     Patient here for evaluation of foot swelling and pain. He has chronic skin changes to bilateral feet. Left foot is concerning for overlying cellulitis as well. Concern for underlying chronic dermatologic condition. Discussed with patient importance of dermatology follow-up. Will treat with antibiotics for bacterial infection. Presentation is not consistent with DVT, limb ischemia, sepsis, necrosis. Discussed outpatient follow-up and return precautions.  Final Clinical Impression(s) / ED Diagnoses Final diagnoses:  Cellulitis of left lower extremity    Rx / DC Orders ED Discharge Orders         Ordered    clindamycin (CLEOCIN) 150 MG capsule  3 times daily     12/25/19 1315    Ambulatory referral to Dermatology     12/25/19 1316           Quintella Reichert, MD 12/25/19 1322

## 2019-12-25 NOTE — Telephone Encounter (Signed)
Can you find out why (what condition he has) the referral is needed and I can place the referral. It may be in his note if they have gotten that far and if not then I guess, you can contact the person who called you

## 2019-12-26 ENCOUNTER — Other Ambulatory Visit: Payer: Self-pay | Admitting: Family Medicine

## 2019-12-26 DIAGNOSIS — L089 Local infection of the skin and subcutaneous tissue, unspecified: Secondary | ICD-10-CM

## 2019-12-26 NOTE — Telephone Encounter (Signed)
Thanks, ED note reviewed and derm referral placed

## 2019-12-26 NOTE — Progress Notes (Signed)
Patient ID: Edward Wilson, male   DOB: 25-Oct-1960, 59 y.o.   MRN: [id number]  59 yo male s/p ED visit due to recurrent skin infections on his feet. ED has asked that patient be referred to dermatology. Referral placed

## 2019-12-28 ENCOUNTER — Other Ambulatory Visit: Payer: Self-pay

## 2019-12-28 ENCOUNTER — Ambulatory Visit: Payer: BC Managed Care – PPO | Admitting: Dermatology

## 2019-12-28 ENCOUNTER — Encounter: Payer: Self-pay | Admitting: Dermatology

## 2019-12-28 DIAGNOSIS — L400 Psoriasis vulgaris: Secondary | ICD-10-CM | POA: Diagnosis not present

## 2019-12-28 MED ORDER — TRIAMCINOLONE ACETONIDE 0.1 % EX OINT: 1.0000 "application " | TOPICAL_OINTMENT | Freq: Every day | CUTANEOUS | 1 refills | Status: DC

## 2019-12-28 NOTE — Patient Instructions (Signed)
Recent visit to: Emergency room; given diagnosis of cellulitis and put on systemic antibiotics.  He reports swelling in his left lower leg and foot has improved.  He also states that he has had itchy scaly spots on his arms and legs for many years.  He saw a dermatologist several years ago who he felt did not listen to his problems and did nothing to help him.  Examination showed thick dark scale on both feet; mild swelling on the distal left leg and dorsum of the left foot.  Negative Homans.  Strong pedal pulses.  Similar scale on arms particularly on the right elbow suggesting an underlying diagnosis of chronic psoriasis.  I explained in detail the nature of psoriasis and emphasized that the goal would be to control the problem and minimize secondary infection.  He is to continue to take all of his oral antibiotics.  He will add topical triamcinolone ointment which he will cover with a moist wrap for 30 minutes daily for the next 4 weeks.  Follow-up at that time.

## 2019-12-28 NOTE — Progress Notes (Signed)
   Follow-Up Visit   Subjective  Edward Wilson is a 59 y.o. male who presents for the following: Cellulitis (Patient states that he has been told he has cellulitis on both feet.  He was given antibiotics for it.  He has noticed a change in his hands. Increased itching but no cracking or bleeding.).  Cellulitis and rash Location: Legs and arms Duration: Years Quality: Cellulitis improved Associated Signs/Symptoms: Which Modifying Factors: Oral antibiotic, Severity:  Timing: Context:   The following portions of the chart were reviewed this encounter and updated as appropriate:     Objective  Well appearing patient in no apparent distress; mood and affect are within normal limits.  A focused examination was performed including scalp, face, neck arms, legs, hands, feet, nails. Relevant physical exam findings are noted in the Assessment and Plan. Recent visit to: Emergency room; given diagnosis of cellulitis and put on systemic antibiotics.  He reports swelling in his left lower leg and foot has improved.  He also states that he has had itchy scaly spots on his arms and legs for many years.  He saw a dermatologist several years ago who he felt did not listen to his problems and did nothing to help him.  Examination showed thick dark scale on both feet; mild swelling on the distal left leg and dorsum of the left foot.  Negative Homans.  Strong pedal pulses.  Similar scale on arms particularly on the right elbow suggesting an underlying diagnosis of chronic psoriasis.  I explained in detail the nature of psoriasis and emphasized that the goal would be to control the problem and minimize secondary infection.  He is to continue to take all of his oral antibiotics.  He will add topical triamcinolone ointment which he will cover with a moist wrap for 30 minutes daily for the next 4 weeks.  Follow-up at that time. Assessment & Plan   Recent visit to: Emergency room; given diagnosis of cellulitis and put  on systemic antibiotics.  He reports swelling in his left lower leg and foot has improved.  He also states that he has had itchy scaly spots on his arms and legs for many years.  He saw a dermatologist several years ago who he felt did not listen to his problems and did nothing to help him.  Examination showed thick dark scale on both feet; mild swelling on the distal left leg and dorsum of the left foot.  Negative Homans.  Strong pedal pulses.  Similar scale on arms particularly on the right elbow suggesting an underlying diagnosis of chronic psoriasis.  I explained in detail the nature of psoriasis and emphasized that the goal would be to control the problem and minimize secondary infection.  He is to continue to take all of his oral antibiotics.  He will add topical triamcinolone ointment which he will cover with a moist wrap for 30 minutes daily for the next 4 weeks.  Follow-up at that time.

## 2019-12-29 ENCOUNTER — Encounter: Payer: Self-pay | Admitting: Dermatology

## 2020-01-21 ENCOUNTER — Encounter (HOSPITAL_COMMUNITY): Payer: Self-pay | Admitting: Emergency Medicine

## 2020-01-21 ENCOUNTER — Emergency Department (HOSPITAL_COMMUNITY): Payer: BC Managed Care – PPO

## 2020-01-21 ENCOUNTER — Emergency Department (HOSPITAL_COMMUNITY)
Admission: EM | Admit: 2020-01-21 | Discharge: 2020-01-21 | Disposition: A | Discharge status: Home / Self Care | Payer: BC Managed Care – PPO | Attending: Emergency Medicine | Admitting: Emergency Medicine

## 2020-01-21 ENCOUNTER — Other Ambulatory Visit: Payer: Self-pay

## 2020-01-21 DIAGNOSIS — X509XXA Other and unspecified overexertion or strenuous movements or postures, initial encounter: Secondary | ICD-10-CM | POA: Diagnosis not present

## 2020-01-21 DIAGNOSIS — Y999 Unspecified external cause status: Secondary | ICD-10-CM | POA: Insufficient documentation

## 2020-01-21 DIAGNOSIS — F17210 Nicotine dependence, cigarettes, uncomplicated: Secondary | ICD-10-CM | POA: Diagnosis not present

## 2020-01-21 DIAGNOSIS — S29012A Strain of muscle and tendon of back wall of thorax, initial encounter: Secondary | ICD-10-CM

## 2020-01-21 DIAGNOSIS — Y929 Unspecified place or not applicable: Secondary | ICD-10-CM | POA: Insufficient documentation

## 2020-01-21 DIAGNOSIS — I10 Essential (primary) hypertension: Secondary | ICD-10-CM | POA: Diagnosis not present

## 2020-01-21 DIAGNOSIS — R05 Cough: Secondary | ICD-10-CM | POA: Diagnosis not present

## 2020-01-21 DIAGNOSIS — Y939 Activity, unspecified: Secondary | ICD-10-CM | POA: Diagnosis not present

## 2020-01-21 DIAGNOSIS — Z79899 Other long term (current) drug therapy: Secondary | ICD-10-CM | POA: Diagnosis not present

## 2020-01-21 DIAGNOSIS — R0789 Other chest pain: Secondary | ICD-10-CM | POA: Diagnosis not present

## 2020-01-21 DIAGNOSIS — S29011A Strain of muscle and tendon of front wall of thorax, initial encounter: Secondary | ICD-10-CM | POA: Diagnosis not present

## 2020-01-21 MED ORDER — METHOCARBAMOL 500 MG PO TABS: 500.0000 mg | ORAL_TABLET | Freq: Three times a day (TID) | ORAL | 0 refills | Status: DC | PRN

## 2020-01-21 NOTE — Discharge Instructions (Signed)
Per our discussion, I am prescribing you Robaxin which is a muscle relaxant.  I would recommend taking this in the evenings due to its sedating effects.  Please be sure not to drive a motor vehicle or operate heavy machinery after taking this medication.  Also I would recommend purchasing Voltaren gel at your local pharmacy.  You can apply this as needed to the region on your left ribs for pain management.  You can also take Tylenol for your pain.  Please do not exceed more than 4 g of Tylenol in a day.  Please not hesitate to return to the emergency department for any new or worsening symptoms.  Please be sure to follow-up with all of your appointments at the end of the month.  It was a pleasure to meet you.

## 2020-01-21 NOTE — ED Notes (Signed)
PT reports increased use of HHN  Due to Asthma.

## 2020-01-21 NOTE — ED Triage Notes (Signed)
C/o L sided rib pain for over a year with intermittent non-productive cough and mild SOB.

## 2020-01-21 NOTE — ED Notes (Signed)
Patient verbalizes understanding of discharge instructions . Opportunity for questions and answers were provided . Armband removed by staff ,Pt discharged from ED. W/C  offered at D/C  and Declined W/C at D/C and was escorted to lobby by RN.  

## 2020-01-21 NOTE — ED Provider Notes (Signed)
[facility] EMERGENCY DEPARTMENT Provider Note   CSN: [id number] Arrival date & time: 01/21/20  [id number]     History Chief Complaint  Patient presents with  . Chest Pain  . Cough    Edward Wilson is a 59 y.o. male.  HPI HPI Comments: Edward Wilson is a 59 y.o. male who presents to the Emergency Department complaining of acute on chronic left-sided chest pain.  Patient states that he is experienced pain in the inferior left axillary region for "years".  He has been taking over-the-counter medications for this with minimal relief.  Last night while at work he felt like he experienced worsening pain and had difficulty sleeping last night due to this.  He also reports that he has a history of asthma and has been using his albuterol inhaler more frequently recently.  He denies any shortness of breath at this time but states that he will experience it with exertion and will use his albuterol 2-3 times daily for the past few weeks.  Patient also notes a prior visit in which he was evaluated for cellulitis of the left foot.  He denies fevers, chills, abdominal pain, nausea, vomiting, diarrhea, anterior chest pain, numbness, tingling, leg swelling, syncope.    Past Medical History:  Diagnosis Date  . Anemia   . Asthma   . GERD (gastroesophageal reflux disease)   . Hyperlipidemia   . Hypertension     Patient Active Problem List   Diagnosis Date Noted  . Supraspinatus tendon tear, right, initial encounter 11/01/2017  . Infraspinatus tendon tear, right, initial encounter 11/01/2017  . Hand dermatitis 02/01/2017  . Pure hypercholesterolemia 09/18/2016  . Asthma, chronic 08/07/2016  . History of syphilis 10/14/2015  . Essential hypertension 02/12/2015  . Arthralgia 02/12/2015  . Tobacco use disorder 02/12/2015  . Alcohol use 02/12/2015    Past Surgical History:  Procedure Laterality Date  . NO PAST SURGERIES        Family History  Problem Relation Age of Onset  .  Heart failure Mother        heart attack  . Heart attack Mother   . Colon cancer Neg Hx     Social History   Tobacco Use  . Smoking status: Current Every Day Smoker    Packs/day: 0.10    Types: Cigarettes    Last attempt to quit: 01/25/2017    Years since quitting: 2.9  . Smokeless tobacco: Never Used  . Tobacco comment: Smoking 1-2 cigs 3 days per week  Substance Use Topics  . Alcohol use: Yes    Comment: 2, 40 oz of beer per week   . Drug use: No    Home Medications Prior to Admission medications   Medication Sig Start Date End Date Taking? Authorizing Provider  albuterol (VENTOLIN HFA) 108 (90 Base) MCG/ACT inhaler Inhale 2 puffs into the lungs every 6 (six) hours as needed for wheezing or shortness of breath. 04/17/19   Fulp, Cammie, MD  amLODipine (NORVASC) 10 MG tablet Take 1 tablet (10 mg total) by mouth daily. 09/08/19 12/07/19  Fulp, Cammie, MD  atorvastatin (LIPITOR) 40 MG tablet Take 1 tablet (40 mg total) by mouth daily. Patient not taking: Reported on 11/09/2019 08/18/19 11/16/19  Fulp, Ander Gaster, MD  clindamycin (CLEOCIN) 150 MG capsule Take 3 capsules (450 mg total) by mouth 3 (three) times daily. 12/25/19   Quintella Reichert, MD  clotrimazole-betamethasone (LOTRISONE) lotion Apply topically 2 (two) times daily. 12/22/18   Argentina Donovan, PA-C  fluticasone (FLOVENT HFA) 220 MCG/ACT inhaler Inhale 1 puff into the lungs 2 (two) times daily. 11/09/19   Fulp, Cammie, MD  losartan (COZAAR) 50 MG tablet Take 1 tablet (50 mg total) by mouth daily. 09/08/19   Fulp, Cammie, MD  montelukast (SINGULAIR) 10 MG tablet Take 1 tablet (10 mg total) by mouth at bedtime. 12/22/18   Argentina Donovan, PA-C  omeprazole (PRILOSEC) 40 MG capsule Take 1 capsule (40 mg total) by mouth daily. 03/06/19   Gildardo Pounds, NP  triamcinolone ointment (KENALOG) 0.1 % Apply 1 application topically daily. 12/28/19   Lavonna Monarch, MD    Allergies    Shellfish allergy  Review of Systems   Review of Systems    Constitutional: Negative for chills and fever.  HENT: Negative for congestion, sinus pressure, sneezing and sore throat.   Respiratory: Negative for chest tightness, shortness of breath and wheezing.   Cardiovascular: Positive for chest pain. Negative for leg swelling.  Gastrointestinal: Negative for abdominal pain, diarrhea, nausea and vomiting.  Genitourinary: Negative for difficulty urinating, dysuria and hematuria.  Musculoskeletal: Positive for myalgias.  Neurological: Negative for syncope and numbness.  All other systems reviewed and are negative.  Physical Exam Updated Vital Signs BP 140/71 (BP Location: Left Arm)   Pulse 68   Temp 98.7 F (37.1 C) (Oral)   Resp 20   Ht 6\' 5"  (1.956 m)   Wt 136.1 kg   SpO2 98%   BMI 35.57 kg/m   Physical Exam Vitals and nursing note reviewed.  Constitutional:      General: He is not in acute distress.    Appearance: Normal appearance. He is well-developed. He is obese. He is not ill-appearing, toxic-appearing or diaphoretic.  HENT:     Head: Normocephalic and atraumatic.     Right Ear: External ear normal.     Left Ear: External ear normal.     Nose: Nose normal.     Mouth/Throat:     Mouth: Mucous membranes are moist.     Pharynx: Oropharynx is clear. No oropharyngeal exudate or posterior oropharyngeal erythema.  Eyes:     Extraocular Movements: Extraocular movements intact.     Pupils: Pupils are equal, round, and reactive to light.  Cardiovascular:     Rate and Rhythm: Normal rate and regular rhythm.     Pulses: Normal pulses.          Radial pulses are 2+ on the right side and 2+ on the left side.       Dorsalis pedis pulses are 2+ on the right side and 2+ on the left side.     Heart sounds: Normal heart sounds. No murmur. No friction rub. No gallop.      Comments: Mild TTP noted in the left inferior posterior axillary region along the latissimus dorsi.  No pain with range of motion of the left upper extremity.  Full range of  motion of the bilateral upper extremities.  No overlying erythema or edema.  No signs of infection. Pulmonary:     Effort: Pulmonary effort is normal. No respiratory distress.     Breath sounds: Normal breath sounds. No stridor. No wheezing, rhonchi or rales.  Abdominal:     General: Abdomen is flat.     Palpations: Abdomen is soft. There is no mass.     Tenderness: There is no abdominal tenderness.  Musculoskeletal:        General: Normal range of motion.     Cervical back:  Normal range of motion and neck supple. No tenderness.     Right lower leg: No tenderness. No edema.     Left lower leg: No tenderness. No edema.     Comments: Dry flaking skin noted to the bilateral lower extremities and feet.  Site of prior cellulitis on the left foot is noted to be well-healing.  Skin:    General: Skin is warm and dry.  Neurological:     General: No focal deficit present.     Mental Status: He is alert and oriented to person, place, and time.  Psychiatric:        Mood and Affect: Mood normal.        Behavior: Behavior normal.     ED Results / Procedures / Treatments   Labs (all labs ordered are listed, but only abnormal results are displayed) Labs Reviewed - No data to display  EKG EKG Interpretation  Date/Time:  Sunday January 21 2020 09:17:56 EDT Ventricular Rate:  70 PR Interval:  148 QRS Duration: 86 QT Interval:  368 QTC Calculation: 397 R Axis:   41 Text Interpretation: Normal sinus rhythm Septal infarct , age undetermined ST & T wave abnormality, consider inferior ischemia Abnormal ECG nonspecific ST/T changes similar to 2016 Confirmed by Sherwood Gambler [phone]) on 01/21/2020 9:25:55 AM   Radiology DG Chest 2 View  Result Date: 01/21/2020 CLINICAL DATA:  Dyspnea, left rib pain for 1 year. Intermittent cough. EXAM: CHEST - 2 VIEW COMPARISON:  01/25/2017 chest radiograph. FINDINGS: Stable cardiomediastinal silhouette with normal heart size. No pneumothorax. No pleural effusion.  Lungs appear clear, with no acute consolidative airspace disease and no pulmonary edema. IMPRESSION: No active cardiopulmonary disease. Electronically Signed   By: Ilona Sorrel M.D.   On: 01/21/2020 10:08    Procedures Procedures   Medications Ordered in ED Medications - No data to display  ED Course  I have reviewed the triage vital signs and the nursing notes.  Pertinent labs & imaging results that were available during my care of the patient were reviewed by me and considered in my medical decision making (see chart for details).    MDM Rules/Calculators/A&P                      Patient is a pleasant 59 year old male who presents today with acute on chronic pain in the musculature of the left lateral chest wall.  He has a point of focal tenderness along the latissimus dorsi.  No signs of infection.  Due to his complaint of "chest pain" he had a chest x-ray which was normal and an ECG which is reassuring.  I discussed his symptoms in length.  His lungs are clear to auscultation bilaterally and his vital signs are stable.  He is oxygenating well.  I offered to refill his albuterol but he states that he has enough.  He has been taking ibuprofen regularly without significant relief of his pain.  I suggested he switch to Tylenol.  I also discussed applying Voltaren gel to the region as needed.  I will prescribe him a short course of Robaxin.  He notes that this makes him sleepy and was made aware not to operate a motor vehicle or heavy machinery after taking this medication.  He understands to return to the emergency department with any new or worsening symptoms.  He does note that he has a follow-up appointment with dermatology, pulmonology and primary care in the next few weeks.  I urged  him to keep these appointments.  His vital signs are stable and he was amicable at the time of discharge.  Patient discharged to home/self care.  Condition at discharge: Stable  Note: Portions of this report  may have been transcribed using voice recognition software. Every effort was made to ensure accuracy; however, inadvertent computerized transcription errors may be present.    Final Clinical Impression(s) / ED Diagnoses Final diagnoses:  Chest wall pain  Strain of latissimus dorsi muscle, initial encounter    Rx / DC Orders ED Discharge Orders         Ordered    methocarbamol (ROBAXIN) 500 MG tablet  Every 8 hours PRN     01/21/20 1202           Rayna Sexton, PA-C 01/21/20 1206    Sherwood Gambler, MD 01/22/20 1705

## 2020-01-21 NOTE — ED Notes (Signed)
Pt reports pain located up and down the left

## 2020-01-30 ENCOUNTER — Ambulatory Visit: Payer: BC Managed Care – PPO | Admitting: Dermatology

## 2020-02-06 ENCOUNTER — Encounter: Payer: Self-pay | Admitting: Critical Care Medicine

## 2020-02-06 ENCOUNTER — Ambulatory Visit: Payer: BC Managed Care – PPO | Attending: Critical Care Medicine | Admitting: Critical Care Medicine

## 2020-02-06 ENCOUNTER — Other Ambulatory Visit: Payer: Self-pay

## 2020-02-06 ENCOUNTER — Encounter: Payer: Self-pay | Admitting: Pulmonary Disease

## 2020-02-06 ENCOUNTER — Ambulatory Visit (INDEPENDENT_AMBULATORY_CARE_PROVIDER_SITE_OTHER): Payer: BC Managed Care – PPO | Admitting: Pulmonary Disease

## 2020-02-06 VITALS — BP 120/68 | HR 78 | Temp 97.2°F | Ht 77.0 in | Wt 281.4 lb

## 2020-02-06 VITALS — BP 139/72 | HR 69 | Temp 98.0°F | Resp 16 | Ht 77.0 in | Wt 283.0 lb

## 2020-02-06 DIAGNOSIS — Z789 Other specified health status: Secondary | ICD-10-CM

## 2020-02-06 DIAGNOSIS — J4521 Mild intermittent asthma with (acute) exacerbation: Secondary | ICD-10-CM

## 2020-02-06 DIAGNOSIS — F17200 Nicotine dependence, unspecified, uncomplicated: Secondary | ICD-10-CM

## 2020-02-06 DIAGNOSIS — F1099 Alcohol use, unspecified with unspecified alcohol-induced disorder: Secondary | ICD-10-CM

## 2020-02-06 DIAGNOSIS — E7800 Pure hypercholesterolemia, unspecified: Secondary | ICD-10-CM | POA: Diagnosis not present

## 2020-02-06 DIAGNOSIS — I10 Essential (primary) hypertension: Secondary | ICD-10-CM | POA: Diagnosis not present

## 2020-02-06 DIAGNOSIS — Z7289 Other problems related to lifestyle: Secondary | ICD-10-CM

## 2020-02-06 DIAGNOSIS — F1090 Alcohol use, unspecified, uncomplicated: Secondary | ICD-10-CM

## 2020-02-06 DIAGNOSIS — J4540 Moderate persistent asthma, uncomplicated: Secondary | ICD-10-CM

## 2020-02-06 HISTORY — DX: Alcohol use, unspecified with unspecified alcohol-induced disorder: F10.99

## 2020-02-06 MED ORDER — LOSARTAN POTASSIUM 50 MG PO TABS: 50.0000 mg | ORAL_TABLET | Freq: Every day | ORAL | 4 refills | Status: DC

## 2020-02-06 MED ORDER — AMLODIPINE BESYLATE 10 MG PO TABS: 10.0000 mg | ORAL_TABLET | Freq: Every day | ORAL | 1 refills | Status: DC

## 2020-02-06 MED ORDER — ALBUTEROL SULFATE HFA 108 (90 BASE) MCG/ACT IN AERS: 2.0000 | INHALATION_SPRAY | Freq: Four times a day (QID) | RESPIRATORY_TRACT | 5 refills | Status: DC | PRN

## 2020-02-06 MED ORDER — ADVAIR HFA 230-21 MCG/ACT IN AERO: 2.0000 | INHALATION_SPRAY | Freq: Two times a day (BID) | RESPIRATORY_TRACT | 12 refills | Status: DC

## 2020-02-06 MED ORDER — MONTELUKAST SODIUM 10 MG PO TABS: 10.0000 mg | ORAL_TABLET | Freq: Every day | ORAL | 11 refills | Status: DC

## 2020-02-06 MED ORDER — ATORVASTATIN CALCIUM 40 MG PO TABS: 40.0000 mg | ORAL_TABLET | Freq: Every day | ORAL | 2 refills | Status: DC

## 2020-02-06 MED ORDER — LORATADINE 10 MG PO TABS: 10.0000 mg | ORAL_TABLET | Freq: Every day | ORAL | 4 refills | Status: DC

## 2020-02-06 MED ORDER — FLUTICASONE PROPIONATE 50 MCG/ACT NA SUSP: 2.0000 | Freq: Every day | NASAL | 6 refills | Status: DC

## 2020-02-06 NOTE — Assessment & Plan Note (Addendum)
Chronic persistent asthma will use albuterol as needed and switch Flovent to Advair 2 inhalations twice daily 200 strength  We will refill Singulair and also fill Flonase 2 sprays each nostril daily and Claritin daily 10 mg for allergic rhinitis component

## 2020-02-06 NOTE — Assessment & Plan Note (Signed)
I spent 5 minutes going over smoking cessation counseling and techniques

## 2020-02-06 NOTE — Assessment & Plan Note (Signed)
Increased cholesterol levels will refill cholesterol medications at this time and follow-up lab studies

## 2020-02-06 NOTE — Patient Instructions (Signed)
We will schedule you for pulmonary function test Check CBC with differential, IgE Please work on quitting smoking Start Advair and other allergy medications as prescribed by primary care Follow-up in 3 months.

## 2020-02-06 NOTE — Progress Notes (Signed)
Edward Wilson    [id number]    [birth year]  Primary Care Physician:Fulp, Ander Gaster, MD  Referring Physician: Antony Blackbird, MD [place],  [name] [address]  Chief complaint: Consult for asthma  HPI: 59 year old with history of asthma, hypertension, allergies, GERD Referred for evaluation and management of asthma Has history of childhood asthma which improved in early adulthood.  Of late for the past 1 to 2 years he has been noticing increasing allergy symptoms and dyspnea with chest tightness, wheezing  Previously maintained on Flovent He has followed up with Dr. Joya Gaskins his primary care today and has been started on Advair, Flonase and Singulair.  He has not picked these medications yet.  Pets: Has a dog, no birds Occupation: Freight forwarder Exposures: No known exposures.  No mold, hot tub, Jacuzzi.  Unsure if he has a down pillow or comforter. Smoking history: 10-pack-year smoker.  Continues to smoke half pack per day Travel history: Originally from [place].  No significant recent travel Relevant family history: No significant family history of lung disease  Outpatient Encounter Medications as of 02/06/2020  Medication Sig  . albuterol (VENTOLIN HFA) 108 (90 Base) MCG/ACT inhaler Inhale 2 puffs into the lungs every 6 (six) hours as needed for wheezing or shortness of breath.  Marland Kitchen amLODipine (NORVASC) 10 MG tablet Take 1 tablet (10 mg total) by mouth daily.  Marland Kitchen atorvastatin (LIPITOR) 40 MG tablet Take 1 tablet (40 mg total) by mouth daily.  . clotrimazole-betamethasone (LOTRISONE) lotion Apply topically 2 (two) times daily.  . fluticasone (FLONASE) 50 MCG/ACT nasal spray Place 2 sprays into both nostrils daily.  . fluticasone-salmeterol (ADVAIR HFA) 230-21 MCG/ACT inhaler Inhale 2 puffs into the lungs 2 (two) times daily.  Marland Kitchen loratadine (CLARITIN) 10 MG tablet Take 1 tablet (10 mg total) by mouth daily.  Marland Kitchen losartan (COZAAR) 50 MG tablet Take 1 tablet (50 mg  total) by mouth daily.  . montelukast (SINGULAIR) 10 MG tablet Take 1 tablet (10 mg total) by mouth at bedtime.  . triamcinolone ointment (KENALOG) 0.1 % Apply 1 application topically daily.   No facility-administered encounter medications on file as of 02/06/2020.    Allergies as of 02/06/2020 - Review Complete 02/06/2020  Allergen Reaction Noted  . Shellfish allergy Hives 01/17/2014    Past Medical History:  Diagnosis Date  . Anemia   . Asthma   . GERD (gastroesophageal reflux disease)   . Hyperlipidemia   . Hypertension     Past Surgical History:  Procedure Laterality Date  . NO PAST SURGERIES      Family History  Problem Relation Age of Onset  . Heart failure Mother        heart attack  . Heart attack Mother   . Colon cancer Neg Hx     Social History   Socioeconomic History  . Marital status: Single    Spouse name: Not on file  . Number of children: Not on file  . Years of education: Not on file  . Highest education level: Not on file  Occupational History  . Not on file  Tobacco Use  . Smoking status: Current Every Day Smoker    Packs/day: 0.10    Types: Cigarettes    Last attempt to quit: 01/25/2017    Years since quitting: 3.0  . Smokeless tobacco: Never Used  . Tobacco comment: 0.5 pack a week 02/06/20/ER  Substance and Sexual Activity  . Alcohol use: Yes  Comment: 2, 40 oz of beer per week   . Drug use: No  . Sexual activity: Not on file  Other Topics Concern  . Not on file  Social History Narrative  . Not on file   Social Determinants of Health   Financial Resource Strain:   . Difficulty of Paying Living Expenses:   Food Insecurity:   . Worried About Charity fundraiser in the Last Year:   . Arboriculturist in the Last Year:   Transportation Needs:   . Film/video editor (Medical):   Marland Kitchen Lack of Transportation (Non-Medical):   Physical Activity:   . Days of Exercise per Week:   . Minutes of Exercise per Session:   Stress:   .  Feeling of Stress :   Social Connections:   . Frequency of Communication with Friends and Family:   . Frequency of Social Gatherings with Friends and Family:   . Attends Religious Services:   . Active Member of Clubs or Organizations:   . Attends Archivist Meetings:   Marland Kitchen Marital Status:   Intimate Partner Violence:   . Fear of Current or Ex-Partner:   . Emotionally Abused:   Marland Kitchen Physically Abused:   . Sexually Abused:     Review of systems: Review of Systems  Constitutional: Negative for fever and chills.  HENT: Negative.   Eyes: Negative for blurred vision.  Respiratory: as per HPI  Cardiovascular: Negative for chest pain and palpitations.  Gastrointestinal: Negative for vomiting, diarrhea, blood per rectum. Genitourinary: Negative for dysuria, urgency, frequency and hematuria.  Musculoskeletal: Negative for myalgias, back pain and joint pain.  Skin: Negative for itching and rash.  Neurological: Negative for dizziness, tremors, focal weakness, seizures and loss of consciousness.  Endo/Heme/Allergies: Negative for environmental allergies.  Psychiatric/Behavioral: Negative for depression, suicidal ideas and hallucinations.  All other systems reviewed and are negative.  Physical Exam: Blood pressure 120/68, pulse 78, temperature (!) 97.2 F (36.2 C), temperature source Temporal, height 6\' 5"  (1.956 m), weight 281 lb 6.4 oz (127.6 kg), SpO2 97 %. Gen:      No acute distress HEENT:  EOMI, sclera anicteric Neck:     No masses; no thyromegaly Lungs:    Clear to auscultation bilaterally; normal respiratory effort CV:         Regular rate and rhythm; no murmurs Abd:      + bowel sounds; soft, non-tender; no palpable masses, no distension Ext:    No edema; adequate peripheral perfusion Skin:      Warm and dry; no rash Neuro: alert and oriented x 3 Psych: normal mood and affect  Data Reviewed: Imaging: Chest x-ray 01/21/2020-no active cardiopulmonary disease.  I have  reviewed the images personally.  PFTs: Pending  ACT score 02/07/2020-12  Labs: CBC 12/22/2018-WBC 6.7, eos 5%, absolute eosinophil count 335  Assessment:  Asthma, allergic rhinitis Has been started on Advair, Flonase and Singulair  We will monitor his symptoms on these medications.  If no improvement or frequent exacerbations then can consider biologic therapy. Check CBC differential, IgE and PFTs for baseline evaluation   Plan/Recommendations: CBC, IgE, PFTs Start Advair, Flonase, Singulair  Marshell Garfinkel MD [facility] 02/06/2020, 3:56 PM  CC: Antony Blackbird, MD

## 2020-02-06 NOTE — Assessment & Plan Note (Signed)
Counseled the patient to reduce further alcohol use

## 2020-02-06 NOTE — Progress Notes (Signed)
Subjective:    Patient ID: Edward Wilson, male    DOB: [birth year], 59 y.o.   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  He is using albuterol and also Flovent however he uses the albuterol incorrectly upon demonstration at this visit  Patient still smoking half a pack a day of cigarettes  Patient does maintain his blood pressure medication which is demonstrating good control but does not have a home blood pressure monitoring device  The patient drinks very minimally at this time   Past Medical History:  Diagnosis Date  . Anemia   . Asthma   . GERD (gastroesophageal reflux disease)   . Hyperlipidemia   . Hypertension      Family History  Problem Relation Age of Onset  . Heart failure Mother        heart attack  . Heart attack Mother   . Colon cancer Neg Hx      Social History   Socioeconomic History  . Marital status: Single    Spouse name: Not on file  . Number of children: Not on file  . Years of education: Not on file  . Highest education level: Not on file  Occupational History  . Not on file  Tobacco Use  . Smoking status: Current Every Day Smoker    Packs/day: 0.10    Types: Cigarettes    Last attempt to quit: 01/25/2017    Years since quitting: 3.0  . Smokeless tobacco: Never Used  . Tobacco comment: Smoking 1-2 cigs 3 days per week  Substance and Sexual Activity  . Alcohol use: Yes    Comment: 2, 40 oz of beer per week   . Drug use: No  . Sexual activity: Not on file  Other Topics Concern  . Not on file  Social History Narrative    . Not on file   Social Determinants of Health   Financial Resource Strain:   . Difficulty of Paying Living Expenses:   Food Insecurity:   . Worried About Charity fundraiser in the Last Year:   . Arboriculturist in the Last Year:   Transportation Needs:   . Film/video editor (Medical):   Marland Kitchen Lack of Transportation (Non-Medical):   Physical Activity:   . Days of Exercise per Week:   . Minutes of Exercise per Session:   Stress:   . Feeling of Stress :   Social Connections:   . Frequency of Communication with Friends and Family:   . Frequency of Social Gatherings with Friends and Family:   . Attends Religious Services:   . Active Member of Clubs or Organizations:   . Attends Archivist Meetings:   Marland Kitchen Marital Status:   Intimate Partner Violence:   . Fear of Current or Ex-Partner:   . Emotionally Abused:   Marland Kitchen Physically Abused:   . Sexually Abused:      Allergies  Allergen Reactions  . Shellfish Allergy Hives     Outpatient Medications Prior to Visit  Medication Sig Dispense Refill  . albuterol (VENTOLIN HFA) 108 (90 Base) MCG/ACT inhaler Inhale 2  puffs into the lungs every 6 (six) hours as needed for wheezing or shortness of breath. 18 g 5  . clotrimazole-betamethasone (LOTRISONE) lotion Apply topically 2 (two) times daily. 30 mL 0  . triamcinolone ointment (KENALOG) 0.1 % Apply 1 application topically daily. 453 g 1  . amLODipine (NORVASC) 10 MG tablet Take 1 tablet (10 mg total) by mouth daily. 30 tablet 4  . atorvastatin (LIPITOR) 40 MG tablet Take 1 tablet (40 mg total) by mouth daily. (Patient not taking: Reported on 11/09/2019) 30 tablet 2  . clindamycin (CLEOCIN) 150 MG capsule Take 3 capsules (450 mg total) by mouth 3 (three) times daily. (Patient not taking: Reported on 02/06/2020) 63 capsule 0  . fluticasone (FLOVENT HFA) 220 MCG/ACT inhaler Inhale 1 puff into the lungs 2 (two) times daily. 1 Inhaler 0  . losartan (COZAAR) 50 MG tablet Take 1 tablet (50 mg  total) by mouth daily. 30 tablet 4  . methocarbamol (ROBAXIN) 500 MG tablet Take 1 tablet (500 mg total) by mouth every 8 (eight) hours as needed for muscle spasms. 12 tablet 0  . montelukast (SINGULAIR) 10 MG tablet Take 1 tablet (10 mg total) by mouth at bedtime. 30 tablet 11  . omeprazole (PRILOSEC) 40 MG capsule Take 1 capsule (40 mg total) by mouth daily. 30 capsule 3   No facility-administered medications prior to visit.      Review of Systems Constitutional:   No  weight loss, night sweats,  Fevers, chills, fatigue, lassitude. HEENT:   No headaches,  Difficulty swallowing,  Tooth/dental problems,  Sore throat,                 sneezing, itching, ear ache, nasal congestion, post nasal drip,   CV:  No chest pain,  Orthopnea, PND, swelling in lower extremities, anasarca, dizziness, palpitations  GI  No heartburn, indigestion, abdominal pain, nausea, vomiting, diarrhea, change in bowel habits, loss of appetite  Resp: No shortness of breath with exertion or at rest.  No excess mucus, no productive cough,  No non-productive cough,  No coughing up of blood.  No change in color of mucus.  No wheezing.  No chest wall deformity  Skin:  rash or lesions.  GU: no dysuria, change in color of urine, no urgency or frequency.  No flank pain.  MS:  No joint pain or swelling.  No decreased range of motion.  No back pain.  Psych:  No change in mood or affect. No depression or anxiety.  No memory loss.     Objective:   Physical Exam Wt Readings from Last 3 Encounters:  02/06/20 283 lb (128.4 kg)  01/21/20 300 lb (136.1 kg)  12/25/19 300 lb (136.1 kg)   Vitals:   02/06/20 0906  BP: 139/72  Pulse: 69  Resp: 16  Temp: 98 F (36.7 C)  SpO2: 95%  Weight: 283 lb (128.4 kg)  Height: 6\' 5"  (1.956 m)    Gen: Pleasant, well-nourished, in no distress,  normal affect  ENT: No lesions,  mouth clear,  oropharynx clear, 4+ postnasal drip, nasal turbinate edema  Neck: No JVD, no TMG, no carotid  bruits  Lungs: No use of accessory muscles, no dullness to percussion, clear without rales or rhonchi  Cardiovascular: RRR, heart sounds normal, no murmur or gallops, no peripheral edema  Abdomen: soft and NT, no HSM,  BS normal  Musculoskeletal: No deformities, no cyanosis or clubbing  Neuro: alert, non focal  Skin: Warm, no lesions , plaque psoriasis  seen on both wrists forearms and upper thighs    Assessment & Plan:  I personally reviewed all images and lab data in the [facility] system as well as any outside material available during this office visit and agree with the  radiology impressions.   Essential hypertension Essential hypertension good control at this time  We will continue amlodipine 10 mg daily and losartan 50 mg daily for now  Asthma, chronic Chronic persistent asthma will use albuterol as needed and switch Flovent to Advair 2 inhalations twice daily 200 strength  Pure hypercholesterolemia Increased cholesterol levels will refill cholesterol medications at this time and follow-up lab studies  Alcohol use Counseled the patient to reduce further alcohol use  Tobacco use disorder I spent 5 minutes going over smoking cessation counseling and techniques   Edward Wilson was seen today for hypertension.  Diagnoses and all orders for this visit:  Essential hypertension -     amLODipine (NORVASC) 10 MG tablet; Take 1 tablet (10 mg total) by mouth daily. -     losartan (COZAAR) 50 MG tablet; Take 1 tablet (50 mg total) by mouth daily.  Mild intermittent asthma with acute exacerbation -     albuterol (VENTOLIN HFA) 108 (90 Base) MCG/ACT inhaler; Inhale 2 puffs into the lungs every 6 (six) hours as needed for wheezing or shortness of breath.  Pure hypercholesterolemia -     atorvastatin (LIPITOR) 40 MG tablet; Take 1 tablet (40 mg total) by mouth daily.  Moderate persistent chronic asthma without complication -     montelukast (SINGULAIR) 10 MG tablet; Take 1 tablet (10 mg  total) by mouth at bedtime.  Tobacco use disorder  Alcohol use  Other orders -     fluticasone-salmeterol (ADVAIR HFA) 230-21 MCG/ACT inhaler; Inhale 2 puffs into the lungs 2 (two) times daily. -     fluticasone (FLONASE) 50 MCG/ACT nasal spray; Place 2 sprays into both nostrils daily. -     loratadine (CLARITIN) 10 MG tablet; Take 1 tablet (10 mg total) by mouth daily.

## 2020-02-06 NOTE — Progress Notes (Signed)
Here for asthma and HTN f /u / Needs refills

## 2020-02-06 NOTE — Assessment & Plan Note (Signed)
Essential hypertension good control at this time  We will continue amlodipine 10 mg daily and losartan 50 mg daily for now

## 2020-02-06 NOTE — Patient Instructions (Addendum)
Start claritin one daily  Start flonase two puff daily  Start advair two puff twice daily  Stop Flovent  Refills on other meds given  Use either Aveeno or Eucerin skin moisturizer on your overall skin and use Burts Bees Heel cream to moisturize the feet.  No other changes made  Return Dr Joya Gaskins in 3 months

## 2020-03-08 ENCOUNTER — Ambulatory Visit: Payer: BC Managed Care – PPO | Admitting: Family Medicine

## 2020-03-16 ENCOUNTER — Other Ambulatory Visit (HOSPITAL_COMMUNITY): Payer: BC Managed Care – PPO

## 2020-03-20 ENCOUNTER — Other Ambulatory Visit: Payer: Self-pay | Admitting: Family Medicine

## 2020-03-20 DIAGNOSIS — I10 Essential (primary) hypertension: Secondary | ICD-10-CM

## 2020-03-21 ENCOUNTER — Other Ambulatory Visit: Payer: Self-pay

## 2020-03-21 ENCOUNTER — Ambulatory Visit: Payer: BC Managed Care – PPO | Attending: Family Medicine

## 2020-05-20 ENCOUNTER — Encounter: Payer: Self-pay | Admitting: Internal Medicine

## 2020-06-20 ENCOUNTER — Ambulatory Visit: Payer: BC Managed Care – PPO | Admitting: Critical Care Medicine

## 2020-06-27 ENCOUNTER — Ambulatory Visit: Payer: BC Managed Care – PPO | Attending: Critical Care Medicine | Admitting: Internal Medicine

## 2020-06-27 ENCOUNTER — Encounter: Payer: Self-pay | Admitting: Internal Medicine

## 2020-06-27 ENCOUNTER — Ambulatory Visit: Payer: BC Managed Care – PPO | Admitting: Internal Medicine

## 2020-06-27 ENCOUNTER — Other Ambulatory Visit: Payer: Self-pay

## 2020-06-27 ENCOUNTER — Ambulatory Visit (HOSPITAL_BASED_OUTPATIENT_CLINIC_OR_DEPARTMENT_OTHER): Payer: BC Managed Care – PPO | Admitting: Pharmacist

## 2020-06-27 DIAGNOSIS — Z23 Encounter for immunization: Secondary | ICD-10-CM

## 2020-06-27 DIAGNOSIS — E7800 Pure hypercholesterolemia, unspecified: Secondary | ICD-10-CM | POA: Diagnosis not present

## 2020-06-27 DIAGNOSIS — I10 Essential (primary) hypertension: Secondary | ICD-10-CM | POA: Diagnosis not present

## 2020-06-27 DIAGNOSIS — R739 Hyperglycemia, unspecified: Secondary | ICD-10-CM

## 2020-06-27 DIAGNOSIS — L989 Disorder of the skin and subcutaneous tissue, unspecified: Secondary | ICD-10-CM | POA: Diagnosis not present

## 2020-06-27 LAB — POCT GLYCOSYLATED HEMOGLOBIN (HGB A1C): HbA1c POC (<> result, manual entry): 5.4 % (ref 4.0–5.6)

## 2020-06-27 LAB — GLUCOSE, POCT (MANUAL RESULT ENTRY): POC Glucose: 92 mg/dl (ref 70–99)

## 2020-06-27 MED ORDER — TRIAMCINOLONE ACETONIDE 0.1 % EX CREA: TOPICAL_CREAM | CUTANEOUS | 0 refills | Status: DC

## 2020-06-27 NOTE — Patient Instructions (Addendum)
Influenza Virus Vaccine injection (Fluarix) What is this medicine? INFLUENZA VIRUS VACCINE (in floo EN zuh VAHY ruhs vak SEEN) helps to reduce the risk of getting influenza also known as the flu. This medicine may be used for other purposes; ask your health care provider or pharmacist if you have questions. COMMON BRAND NAME(S): Fluarix, Fluzone What should I tell my health care provider before I take this medicine? They need to know if you have any of these conditions:  bleeding disorder like hemophilia  fever or infection  Guillain-Barre syndrome or other neurological problems  immune system problems  infection with the human immunodeficiency virus (HIV) or AIDS  low blood platelet counts  multiple sclerosis  an unusual or allergic reaction to influenza virus vaccine, eggs, chicken proteins, latex, gentamicin, other medicines, foods, dyes or preservatives  pregnant or trying to get pregnant  breast-feeding How should I use this medicine? This vaccine is for injection into a muscle. It is given by a health care professional. A copy of Vaccine Information Statements will be given before each vaccination. Read this sheet carefully each time. The sheet may change frequently. Talk to your pediatrician regarding the use of this medicine in children. Special care may be needed. Overdosage: If you think you have taken too much of this medicine contact a poison control center or emergency room at once. NOTE: This medicine is only for you. Do not share this medicine with others. What if I miss a dose? This does not apply. What may interact with this medicine?  chemotherapy or radiation therapy  medicines that lower your immune system like etanercept, anakinra, infliximab, and adalimumab  medicines that treat or prevent blood clots like warfarin  phenytoin  steroid medicines like prednisone or cortisone  theophylline  vaccines This list may not describe all possible  interactions. Give your health care provider a list of all the medicines, herbs, non-prescription drugs, or dietary supplements you use. Also tell them if you smoke, drink alcohol, or use illegal drugs. Some items may interact with your medicine. What should I watch for while using this medicine? Report any side effects that do not go away within 3 days to your doctor or health care professional. Call your health care provider if any unusual symptoms occur within 6 weeks of receiving this vaccine. You may still catch the flu, but the illness is not usually as bad. You cannot get the flu from the vaccine. The vaccine will not protect against colds or other illnesses that may cause fever. The vaccine is needed every year. What side effects may I notice from receiving this medicine? Side effects that you should report to your doctor or health care professional as soon as possible:  allergic reactions like skin rash, itching or hives, swelling of the face, lips, or tongue Side effects that usually do not require medical attention (report to your doctor or health care professional if they continue or are bothersome):  fever  headache  muscle aches and pains  pain, tenderness, redness, or swelling at site where injected  weak or tired This list may not describe all possible side effects. Call your doctor for medical advice about side effects. You may report side effects to FDA at 1-800-FDA-1088. Where should I keep my medicine? This vaccine is only given in a clinic, pharmacy, doctor's office, or other health care setting and will not be stored at home. NOTE: This sheet is a summary. It may not cover all possible information. If you have questions   about this medicine, talk to your doctor, pharmacist, or health care provider.  2020 Elsevier/Gold Standard (2008-04-18 09:30:40)  

## 2020-06-27 NOTE — Assessment & Plan Note (Signed)
Reviewed previous lipids. He was off medications. No indication for treatemnt at this time.

## 2020-06-27 NOTE — Assessment & Plan Note (Signed)
I think his skin findings are most consistent with psoriasis. He is not overly bothered with symptoms but would like to try something to address the "plaques". Will use triam/eucerin F/u one month

## 2020-06-27 NOTE — Addendum Note (Signed)
Addended by: Jackelyn Knife on: 06/27/2020 09:55 AM   Modules accepted: Orders

## 2020-06-27 NOTE — Assessment & Plan Note (Signed)
Reviewed last glucose of 135 Recheck today with a1c

## 2020-06-27 NOTE — Progress Notes (Signed)
Patient comes in for f/u of BP. He is adherent with BP meds and has no chest pain, SOB, he denies headache.   Tobacco use- up to 1ppd Alcohol- 1-2 beers/day.  He has had long term "dry skin". Has not tried any of the creams prescribed and were on med list. Skin is dry on arms> legs and only minimally pruruitic.  Lipids- he has never taken a cholesterol medication (his report)-  Lab Results  Component Value Date   CHOL 173 03/21/2019   HDL 67 03/21/2019   LDLCALC 90 03/21/2019   TRIG 79 03/21/2019   CHOLHDL 2.6 03/21/2019   Glucose- reviewed last glucose - 135mg /dl  Past Medical History:  Diagnosis Date  . Anemia   . Asthma   . GERD (gastroesophageal reflux disease)   . Hyperlipidemia   . Hypertension     Social History   Socioeconomic History  . Marital status: Single    Spouse name: Not on file  . Number of children: Not on file  . Years of education: Not on file  . Highest education level: Not on file  Occupational History  . Not on file  Tobacco Use  . Smoking status: Current Every Day Smoker    Packs/day: 0.10    Types: Cigarettes    Last attempt to quit: 01/25/2017    Years since quitting: 3.4  . Smokeless tobacco: Never Used  . Tobacco comment: 0.5 pack a week 02/06/20/ER  Vaping Use  . Vaping Use: Never used  Substance and Sexual Activity  . Alcohol use: Yes    Comment: 2, 40 oz of beer per week   . Drug use: No  . Sexual activity: Not on file  Other Topics Concern  . Not on file  Social History Narrative  . Not on file   Social Determinants of Health   Financial Resource Strain:   . Difficulty of Paying Living Expenses: Not on file  Food Insecurity:   . Worried About Charity fundraiser in the Last Year: Not on file  . Ran Out of Food in the Last Year: Not on file  Transportation Needs:   . Lack of Transportation (Medical): Not on file  . Lack of Transportation (Non-Medical): Not on file  Physical Activity:   . Days of Exercise per Week: Not on  file  . Minutes of Exercise per Session: Not on file  Stress:   . Feeling of Stress : Not on file  Social Connections:   . Frequency of Communication with Friends and Family: Not on file  . Frequency of Social Gatherings with Friends and Family: Not on file  . Attends Religious Services: Not on file  . Active Member of Clubs or Organizations: Not on file  . Attends Archivist Meetings: Not on file  . Marital Status: Not on file  Intimate Partner Violence:   . Fear of Current or Ex-Partner: Not on file  . Emotionally Abused: Not on file  . Physically Abused: Not on file  . Sexually Abused: Not on file    Past Surgical History:  Procedure Laterality Date  . NO PAST SURGERIES      Family History  Problem Relation Age of Onset  . Heart failure Mother        heart attack  . Heart attack Mother   . Colon cancer Neg Hx     Allergies  Allergen Reactions  . Shellfish Allergy Hives    Current Outpatient Medications on File Prior  to Visit  Medication Sig Dispense Refill  . albuterol (VENTOLIN HFA) 108 (90 Base) MCG/ACT inhaler Inhale 2 puffs into the lungs every 6 (six) hours as needed for wheezing or shortness of breath. 18 g 5  . amLODipine (NORVASC) 10 MG tablet Take 1 tablet (10 mg total) by mouth daily. 90 tablet 1  . fluticasone (FLONASE) 50 MCG/ACT nasal spray Place 2 sprays into both nostrils daily. 16 g 6  . losartan (COZAAR) 50 MG tablet Take 1 tablet by mouth once daily 90 tablet 0  . montelukast (SINGULAIR) 10 MG tablet Take 1 tablet (10 mg total) by mouth at bedtime. 30 tablet 11   No current facility-administered medications on file prior to visit.     patient denies chest pain, shortness of breath, orthopnea. Denies lower extremity edema, abdominal pain, change in appetite, change in bowel movements. Patient denies rashes, musculoskeletal complaints. No other specific complaints in a complete review of systems.   BP 135/79   Pulse (!) 55   Temp 98.3 F  (36.8 C)   Resp 16   Ht 6\' 5"  (1.956 m)   Wt 279 lb 3.2 oz (126.6 kg)   SpO2 98%   BMI 33.11 kg/m   well-developed well-nourished male in no acute distress. HEENT exam atraumatic, normocephalic, neck supple without jugular venous distention. Chest clear to auscultation cardiac exam S1-S2 are regular. Abdominal exam overweight with bowel sounds, soft and nontender. Extremities no edema. Neurologic exam is alert with a normal gait.skin: dry plaques on arms with some residual hyperpigmented scar on arms..   Essential hypertension Well controlled. He is adherent to meds- continue same medicaitons.  Lab Results  Component Value Date   NA 138 12/25/2019   K 3.9 12/25/2019   CO2 25 12/25/2019   GLUCOSE 135 (H) 12/25/2019   BUN 8 12/25/2019   CREATININE 0.92 12/25/2019   CALCIUM 9.0 12/25/2019   GFRNONAA >60 12/25/2019   GFRAA >60 12/25/2019     Pure hypercholesterolemia Reviewed previous lipids. He was off medications. No indication for treatemnt at this time.   Psoriasis-like skin disease I think his skin findings are most consistent with psoriasis. He is not overly bothered with symptoms but would like to try something to address the "plaques". Will use triam/eucerin F/u one month  Hyperglycemia Reviewed last glucose of 135 Recheck today with a1c

## 2020-06-27 NOTE — Progress Notes (Signed)
Patient presents for vaccination against influenza per orders of Dr. Leanne Chang. Consent given. Counseling provided. No contraindications exists. Vaccine administered without incident.   Edward Wilson, PharmD, [place] [phone]

## 2020-06-27 NOTE — Assessment & Plan Note (Signed)
Well controlled. He is adherent to meds- continue same medicaitons.  Lab Results  Component Value Date   NA 138 12/25/2019   K 3.9 12/25/2019   CO2 25 12/25/2019   GLUCOSE 135 (H) 12/25/2019   BUN 8 12/25/2019   CREATININE 0.92 12/25/2019   CALCIUM 9.0 12/25/2019   GFRNONAA >60 12/25/2019   GFRAA >60 12/25/2019

## 2020-06-28 ENCOUNTER — Other Ambulatory Visit: Payer: Self-pay

## 2020-06-28 ENCOUNTER — Ambulatory Visit (AMBULATORY_SURGERY_CENTER): Payer: Self-pay | Admitting: *Deleted

## 2020-06-28 VITALS — Ht 77.0 in | Wt 279.0 lb

## 2020-06-28 DIAGNOSIS — Z86010 Personal history of colonic polyps: Secondary | ICD-10-CM

## 2020-06-28 MED ORDER — NA SULFATE-K SULFATE-MG SULF 17.5-3.13-1.6 GM/177ML PO SOLN: 1.0000 | Freq: Once | ORAL | 0 refills | Status: AC

## 2020-06-28 NOTE — Progress Notes (Signed)
Pt takes [place] for wt. Loss.  States that it is over the counter.  This drug does not contain phentermine.   No egg or soy allergy known to patient  No issues with past sedation with any surgeries or procedures no intubation problems in the past  No FH of Malignant Hyperthermia  No home 02 use per patient  No blood thinners per patient  Pt denies issues with constipation  No A fib or A flutter  EMMI video to pt or via [place] 19 guidelines implemented in PV today with Pt and RN   Due to the COVID-19 pandemic we are asking patients to follow these guidelines. Please only bring one care partner. Please be aware that your care partner may wait in the car in the parking lot or if they feel like they will be too hot to wait in the car, they may wait in the lobby on the 4th floor. All care partners are required to wear a mask the entire time (we do not have any that we can provide them), they need to practice social distancing, and we will do a Covid check for all patient's and care partners when you arrive. Also we will check their temperature and your temperature. If the care partner waits in their car they need to stay in the parking lot the entire time and we will call them on their cell phone when the patient is ready for discharge so they can bring the car to the front of the building. Also all patient's will need to wear a mask into building.

## 2020-07-01 ENCOUNTER — Encounter: Payer: Self-pay | Admitting: Internal Medicine

## 2020-07-05 ENCOUNTER — Telehealth: Payer: Self-pay | Admitting: Internal Medicine

## 2020-07-05 DIAGNOSIS — Z86010 Personal history of colonic polyps: Secondary | ICD-10-CM

## 2020-07-05 NOTE — Telephone Encounter (Signed)
Pt is requesting a call back from a nurse to discuss the prep, pt states it is costing him $50 which he is not able to afford.

## 2020-07-08 MED ORDER — SUTAB 1479-225-188 MG PO TABS: 1.0000 | ORAL_TABLET | ORAL | 0 refills | Status: DC

## 2020-07-08 NOTE — Telephone Encounter (Signed)
Spoke with the patient.   Patient will come to the 3rd floor to pick up sample today and new instructions for the Sutab.

## 2020-07-08 NOTE — Telephone Encounter (Signed)
Called patient, no answer, left a message for him to call back.

## 2020-07-11 ENCOUNTER — Encounter: Payer: Self-pay | Admitting: Internal Medicine

## 2020-07-11 ENCOUNTER — Ambulatory Visit (AMBULATORY_SURGERY_CENTER): Payer: BC Managed Care – PPO | Admitting: Internal Medicine

## 2020-07-11 ENCOUNTER — Other Ambulatory Visit: Payer: Self-pay

## 2020-07-11 VITALS — BP 131/72 | HR 54 | Temp 97.4°F | Resp 14 | Ht 77.0 in | Wt 279.0 lb

## 2020-07-11 DIAGNOSIS — D122 Benign neoplasm of ascending colon: Secondary | ICD-10-CM

## 2020-07-11 DIAGNOSIS — K635 Polyp of colon: Secondary | ICD-10-CM

## 2020-07-11 DIAGNOSIS — Z86010 Personal history of colonic polyps: Secondary | ICD-10-CM | POA: Diagnosis not present

## 2020-07-11 DIAGNOSIS — D123 Benign neoplasm of transverse colon: Secondary | ICD-10-CM

## 2020-07-11 DIAGNOSIS — D124 Benign neoplasm of descending colon: Secondary | ICD-10-CM

## 2020-07-11 DIAGNOSIS — Z1211 Encounter for screening for malignant neoplasm of colon: Secondary | ICD-10-CM | POA: Diagnosis not present

## 2020-07-11 MED ORDER — SODIUM CHLORIDE 0.9 % IV SOLN: 500.0000 mL | Freq: Once | INTRAVENOUS | Status: DC

## 2020-07-11 NOTE — Progress Notes (Signed)
A and O x3. Report to RN. Tolerated MAC anesthesia well.

## 2020-07-11 NOTE — Progress Notes (Signed)
Vital signs checked by:CW  The patient states no changes in medical or surgical history since pre-visit screening on 06/28/20.  

## 2020-07-11 NOTE — Op Note (Signed)
[facility] Patient Name: Edward Wilson Procedure Date: 07/11/2020 11:47 AM MRN: [id number] Endoscopist: Docia Chuck. Henrene Pastor , MD Age: 59 Referring MD:  Date of Birth: 1961-06-18 Gender: Male Account #: [id number] Procedure:                Colonoscopy with cold snare polypectomy x 3 Indications:              High risk colon cancer surveillance: Personal                            history of non-advanced adenoma. Previous                            examination June 2016 Medicines:                Monitored Anesthesia Care Procedure:                Pre-Anesthesia Assessment:                           - Prior to the procedure, a History and Physical                            was performed, and patient medications and                            allergies were reviewed. The patient's tolerance of                            previous anesthesia was also reviewed. The risks                            and benefits of the procedure and the sedation                            options and risks were discussed with the patient.                            All questions were answered, and informed consent                            was obtained. Prior Anticoagulants: The patient has                            taken no previous anticoagulant or antiplatelet                            agents. After reviewing the risks and benefits, the                            patient was deemed in satisfactory condition to                            undergo the procedure.  After obtaining informed consent, the colonoscope                            was passed under direct vision. Throughout the                            procedure, the patient's blood pressure, pulse, and                            oxygen saturations were monitored continuously. The                            Colonoscope was introduced through the anus and                            advanced to the the cecum, identified  by                            appendiceal orifice and ileocecal valve. The                            ileocecal valve, appendiceal orifice, and rectum                            were photographed. The quality of the bowel                            preparation was excellent. The colonoscopy was                            performed without difficulty. The patient tolerated                            the procedure well. The bowel preparation used was                            SUPREP via split dose instruction. Scope In: 12:09:07 PM Scope Out: 12:26:12 PM Scope Withdrawal Time: 0 hours 14 minutes 0 seconds  Total Procedure Duration: 0 hours 17 minutes 5 seconds  Findings:                 Three polyps were found in the descending colon,                            transverse colon and ascending colon. The polyps                            were 3 to 5 mm in size. These polyps were removed                            with a cold snare. Resection and retrieval were                            complete.  Multiple diverticula were found in the left colon.                           The exam was otherwise without abnormality on                            direct and retroflexion views. Complications:            No immediate complications. Estimated blood loss:                            None. Estimated Blood Loss:     Estimated blood loss: none. Impression:               - Three 3 to 5 mm polyps in the descending colon,                            in the transverse colon and in the ascending colon,                            removed with a cold snare. Resected and retrieved.                           - Diverticulosis in the left colon.                           - The examination was otherwise normal on direct                            and retroflexion views. Recommendation:           - Repeat colonoscopy in 5 years for surveillance.                           - Patient has a  contact number available for                            emergencies. The signs and symptoms of potential                            delayed complications were discussed with the                            patient. Return to normal activities tomorrow.                            Written discharge instructions were provided to the                            patient.                           - Resume previous diet.                           - Continue present medications.                           -  Await pathology results. Docia Chuck. Henrene Pastor, MD 07/11/2020 12:32:38 PM This report has been signed electronically.

## 2020-07-11 NOTE — Progress Notes (Signed)
Called to room to assist during endoscopic procedure.  Patient ID and intended procedure confirmed with present staff. Received instructions for my participation in the procedure from the performing physician.  

## 2020-07-11 NOTE — Patient Instructions (Signed)
Impression/Recommendations:  Polyp handout given to patient. Diverticulosis handout given to patient.  Repeat colonoscopy in 5 years for surveillance.  Resume previous diet. Continue present medications. Await pathology results.  YOU HAD AN ENDOSCOPIC PROCEDURE TODAY AT [facility]:   Refer to the procedure report that was given to you for any specific questions about what was found during the examination.  If the procedure report does not answer your questions, please call your gastroenterologist to clarify.  If you requested that your care partner not be given the details of your procedure findings, then the procedure report has been included in a sealed envelope for you to review at your convenience later.  YOU SHOULD EXPECT: Some feelings of bloating in the abdomen. Passage of more gas than usual.  Walking can help get rid of the air that was put into your GI tract during the procedure and reduce the bloating. If you had a lower endoscopy (such as a colonoscopy or flexible sigmoidoscopy) you may notice spotting of blood in your stool or on the toilet paper. If you underwent a bowel prep for your procedure, you may not have a normal bowel movement for a few days.  Please Note:  You might notice some irritation and congestion in your nose or some drainage.  This is from the oxygen used during your procedure.  There is no need for concern and it should clear up in a day or so.  SYMPTOMS TO REPORT IMMEDIATELY:   Following lower endoscopy (colonoscopy or flexible sigmoidoscopy):  Excessive amounts of blood in the stool  Significant tenderness or worsening of abdominal pains  Swelling of the abdomen that is new, acute  Fever of 100F or higher For urgent or emergent issues, a gastroenterologist can be reached at any hour by calling [phone]. Do not use MyChart messaging for urgent concerns.    DIET:  We do recommend a small meal at first, but then you may proceed to  your regular diet.  Drink plenty of fluids but you should avoid alcoholic beverages for 24 hours.  ACTIVITY:  You should plan to take it easy for the rest of today and you should NOT DRIVE or use heavy machinery until tomorrow (because of the sedation medicines used during the test).    FOLLOW UP: Our staff will call the number listed on your records 48-72 hours following your procedure to check on you and address any questions or concerns that you may have regarding the information given to you following your procedure. If we do not reach you, we will leave a message.  We will attempt to reach you two times.  During this call, we will ask if you have developed any symptoms of COVID 19. If you develop any symptoms (ie: fever, flu-like symptoms, shortness of breath, cough etc.) before then, please call [phone].  If you test positive for Covid 19 in the 2 weeks post procedure, please call and report this information to [place].    If any biopsies were taken you will be contacted by phone or by letter within the next 1-3 weeks.  Please call us at [phone] if you have not heard about the biopsies in 3 weeks.    SIGNATURES/CONFIDENTIALITY: You and/or your care partner have signed paperwork which will be entered into your electronic medical record.  These signatures attest to the fact that that the information above on your After Visit Summary has been reviewed and is understood.  Full responsibility of the  confidentiality of this discharge information lies with you and/or your care-partner. 

## 2020-07-15 ENCOUNTER — Telehealth: Payer: Self-pay | Admitting: *Deleted

## 2020-07-15 ENCOUNTER — Telehealth: Payer: Self-pay

## 2020-07-15 NOTE — Telephone Encounter (Signed)
  Follow up Call-  Call back number 07/11/2020  Post procedure Call Back phone  # [phone]  Permission to leave phone message Yes  Some recent data might be hidden     2nd follow up call made.  NALM

## 2020-07-15 NOTE — Telephone Encounter (Signed)
Attempted f/u phone call. No answer. Left message. °

## 2020-07-16 ENCOUNTER — Encounter: Payer: Self-pay | Admitting: Internal Medicine

## 2020-07-19 ENCOUNTER — Other Ambulatory Visit: Payer: Self-pay | Admitting: Family Medicine

## 2020-07-19 DIAGNOSIS — I10 Essential (primary) hypertension: Secondary | ICD-10-CM

## 2020-07-19 MED ORDER — LOSARTAN POTASSIUM 50 MG PO TABS: 50.0000 mg | ORAL_TABLET | Freq: Every day | ORAL | 0 refills | Status: DC

## 2020-07-19 MED ORDER — AMLODIPINE BESYLATE 10 MG PO TABS: 10.0000 mg | ORAL_TABLET | Freq: Every day | ORAL | 0 refills | Status: DC

## 2020-07-19 NOTE — Telephone Encounter (Signed)
Requested Prescriptions  Pending Prescriptions Disp Refills  . losartan (COZAAR) 50 MG tablet 90 tablet 0    Sig: Take 1 tablet (50 mg total) by mouth daily.     Cardiovascular:  Angiotensin Receptor Blockers Failed - 07/19/2020  1:50 PM      Failed - Cr in normal range and within 180 days    Creat  Date Value Ref Range Status  09/17/2016 0.92 0.70 - 1.33 mg/dL Final    Comment:      For patients > or = 61 years of age: The upper reference limit for Creatinine is approximately 13% higher for people identified as African-American.      Creatinine, Ser  Date Value Ref Range Status  12/25/2019 0.92 0.61 - 1.24 mg/dL Final         Failed - K in normal range and within 180 days    Potassium  Date Value Ref Range Status  12/25/2019 3.9 3.5 - 5.1 mmol/L Final         Passed - Patient is not pregnant      Passed - Last BP in normal range    BP Readings from Last 1 Encounters:  07/11/20 131/72         Passed - Valid encounter within last 6 months    Recent Outpatient Visits          3 weeks ago Need for influenza vaccination   [place], Jarome Matin, RPH-CPP   3 weeks ago Essential hypertension   [place], Darrick Penna, MD   5 months ago Essential hypertension   [place] Elsie Stain, MD   8 months ago Essential hypertension   [place], MD   10 months ago Essential hypertension   [place], MD      Future Appointments            In 2 weeks Antony Blackbird, MD [place]           . amLODipine (NORVASC) 10 MG tablet 90 tablet 1    Sig: Take 1 tablet (10 mg total) by mouth daily.     Cardiovascular:  Calcium Channel Blockers Passed - 07/19/2020  1:50 PM      Passed - Last BP in normal range    BP Readings from Last 1 Encounters:   07/11/20 131/72         Passed - Valid encounter within last 6 months    Recent Outpatient Visits          3 weeks ago Need for influenza vaccination   [place], Jarome Matin, RPH-CPP   3 weeks ago Essential hypertension   [place] [facility], Darrick Penna, MD   5 months ago Essential hypertension   [place] Elsie Stain, MD   8 months ago Essential hypertension   [place], MD   10 months ago Essential hypertension   [place], MD      Future Appointments            In 2 weeks Antony Blackbird, MD [place]           Per OV  3 weeks ago. Continue meds Lab values noted. Patient will need labs next visit.

## 2020-07-19 NOTE — Telephone Encounter (Signed)
Medication: losartan (COZAAR) 50 MG tablet [[id number]] , amLODipine (NORVASC) 10 MG tablet [[id number]]  ENDED  Has the patient contacted their pharmacy? YES (Agent: If no, request that the patient contact the pharmacy for the refill.) (Agent: If yes, when and what did the pharmacy advise?)  Preferred Pharmacy (with phone number or street name): [facility] ([place]), [place] - [address] [place] [address] Phone: [phone] Fax: [phone] Hours: Not open 24 hours    Agent: Please be advised that RX refills may take up to 3 business days. We ask that you follow-up with your pharmacy.

## 2020-08-05 ENCOUNTER — Ambulatory Visit: Payer: BC Managed Care – PPO | Admitting: Family Medicine

## 2020-08-21 ENCOUNTER — Ambulatory Visit: Payer: BC Managed Care – PPO | Attending: Family Medicine | Admitting: Family Medicine

## 2020-08-21 ENCOUNTER — Encounter: Payer: Self-pay | Admitting: Family Medicine

## 2020-08-21 ENCOUNTER — Other Ambulatory Visit: Payer: Self-pay

## 2020-08-21 VITALS — BP 144/73 | HR 75 | Wt 277.8 lb

## 2020-08-21 DIAGNOSIS — J4540 Moderate persistent asthma, uncomplicated: Secondary | ICD-10-CM

## 2020-08-21 MED ORDER — MONTELUKAST SODIUM 10 MG PO TABS: 10.0000 mg | ORAL_TABLET | Freq: Every day | ORAL | 11 refills | Status: DC

## 2020-08-21 MED ORDER — PREDNISONE 20 MG PO TABS: ORAL_TABLET | ORAL | 1 refills | Status: DC

## 2020-08-21 NOTE — Progress Notes (Signed)
Established Patient Office Visit  Subjective:  Patient ID: Edward Wilson, male    DOB: 12-24-1960  Age: 59 y.o. MRN: [id number]  CC:  Chief Complaint  Patient presents with  . Hypertension    HPI Edward Wilson, 59 year old male, was seen 06/27/2020 by another provider for HTN and skin issues.  He reports that today's visit that he needs refill of montelukast for continued treatment of asthma and allergic rhinitis.  He states that he was not really sure the medication was helping but after running out of the medication, he noticed that he has had some increased issues with nasal congestion as well as sensation of chest congestion/chest tightness.  He reports that with the recent change in the weather, that he has noticed increased sensation of shortness of breath, chest tightness as if he is about to have an asthma flareup.  He is trying to use his albuterol inhaler sparingly.  He denies any productive cough, no fever or chills.  No sore throat.         He continues to have issues with skin rashes but states that he was prescribed ointment by dermatology to help with the skin rashes however he has not been using the ointment on a regular basis and he believes that this is why he still has issues with his skin.  He is taking his blood pressure medication daily and blood pressure at his last visit was normal.  He does not need refill blood pressure medicine at today's visit. Past Medical History:  Diagnosis Date  . Anemia   . Asthma   . GERD (gastroesophageal reflux disease)   . Hyperlipidemia   . Hypertension     Past Surgical History:  Procedure Laterality Date  . COLONOSCOPY  2016  . NO PAST SURGERIES    . POLYPECTOMY  2016    Family History  Problem Relation Age of Onset  . Heart failure Mother        heart attack  . Heart attack Mother   . Colon cancer Neg Hx   . Stomach cancer Neg Hx   . Esophageal cancer Neg Hx   . Rectal cancer Neg Hx     Social History   Socioeconomic  History  . Marital status: Single    Spouse name: Not on file  . Number of children: Not on file  . Years of education: Not on file  . Highest education level: Not on file  Occupational History  . Not on file  Tobacco Use  . Smoking status: Current Some Day Smoker    Packs/day: 0.10    Types: Cigarettes    Last attempt to quit: 01/25/2017    Years since quitting: 3.5  . Smokeless tobacco: Never Used  . Tobacco comment: 0.5 pack a week 02/06/20/ER  Vaping Use  . Vaping Use: Never used  Substance and Sexual Activity  . Alcohol use: Yes    Comment: 2-3, 40 oz of beer per week   . Drug use: No  . Sexual activity: Not on file  Other Topics Concern  . Not on file  Social History Narrative  . Not on file   Social Determinants of Health   Financial Resource Strain:   . Difficulty of Paying Living Expenses: Not on file  Food Insecurity:   . Worried About Charity fundraiser in the Last Year: Not on file  . Ran Out of Food in the Last Year: Not on file  Transportation Needs:   .  Lack of Transportation (Medical): Not on file  . Lack of Transportation (Non-Medical): Not on file  Physical Activity:   . Days of Exercise per Week: Not on file  . Minutes of Exercise per Session: Not on file  Stress:   . Feeling of Stress : Not on file  Social Connections:   . Frequency of Communication with Friends and Family: Not on file  . Frequency of Social Gatherings with Friends and Family: Not on file  . Attends Religious Services: Not on file  . Active Member of Clubs or Organizations: Not on file  . Attends Archivist Meetings: Not on file  . Marital Status: Not on file  Intimate Partner Violence:   . Fear of Current or Ex-Partner: Not on file  . Emotionally Abused: Not on file  . Physically Abused: Not on file  . Sexually Abused: Not on file    Outpatient Medications Prior to Visit  Medication Sig Dispense Refill  . albuterol (VENTOLIN HFA) 108 (90 Base) MCG/ACT inhaler  Inhale 2 puffs into the lungs every 6 (six) hours as needed for wheezing or shortness of breath. 18 g 5  . amLODipine (NORVASC) 10 MG tablet Take 1 tablet (10 mg total) by mouth daily. 90 tablet 0  . losartan (COZAAR) 50 MG tablet Take 1 tablet (50 mg total) by mouth daily. 90 tablet 0  . montelukast (SINGULAIR) 10 MG tablet Take 1 tablet (10 mg total) by mouth at bedtime. 30 tablet 11  . fluticasone (FLONASE) 50 MCG/ACT nasal spray Place 2 sprays into both nostrils daily. (Patient not taking: Reported on 07/11/2020) 16 g 6  . NON FORMULARY lipozene for wt. loss    . triamcinolone cream (KENALOG) 0.1 % Apply two times daily to arms (Patient not taking: Reported on 08/21/2020) 454 g 0   No facility-administered medications prior to visit.    Allergies  Allergen Reactions  . Shellfish Allergy Hives    ROS Review of Systems  Constitutional: Negative for chills and fatigue.  HENT: Positive for congestion. Negative for sore throat and trouble swallowing.   Respiratory: Positive for shortness of breath. Negative for cough and wheezing.   Cardiovascular: Negative for chest pain, palpitations and leg swelling.  Gastrointestinal: Negative for abdominal pain, constipation, diarrhea and nausea.  Endocrine: Negative for polydipsia, polyphagia and polyuria.  Genitourinary: Negative for dysuria and frequency.  Musculoskeletal: Negative for arthralgias and back pain.  Skin: Positive for rash. Negative for wound.  Neurological: Negative for dizziness and headaches.  Hematological: Negative for adenopathy. Does not bruise/bleed easily.      Objective:    Physical Exam Vitals and nursing note reviewed.  Constitutional:      General: He is not in acute distress.    Appearance: Normal appearance. He is obese.  Cardiovascular:     Rate and Rhythm: Normal rate and regular rhythm.  Pulmonary:     Effort: Pulmonary effort is normal.     Breath sounds: Normal breath sounds.     Comments: Mild  decrease in air movement throughout lung fields, no accessory muscle use, normal work of breathing Abdominal:     Palpations: Abdomen is soft.     Tenderness: There is no abdominal tenderness. There is no guarding or rebound.  Musculoskeletal:     Cervical back: Neck supple. No rigidity or tenderness.     Right lower leg: No edema.     Left lower leg: No edema.  Lymphadenopathy:     Cervical: No cervical  adenopathy.  Skin:    Findings: Rash present.     Comments: Areas of hyperpigmented, flaky skin on the wrists/hands  Neurological:     General: No focal deficit present.     Mental Status: He is alert and oriented to person, place, and time.  Psychiatric:        Mood and Affect: Mood normal.        Behavior: Behavior normal.     BP (!) 144/73 (BP Location: Left Arm, Patient Position: Sitting)   Pulse 75   Wt 277 lb 12.8 oz (126 kg)   SpO2 97%   BMI 32.94 kg/m  Wt Readings from Last 3 Encounters:  08/21/20 277 lb 12.8 oz (126 kg)  07/11/20 279 lb (126.6 kg)  06/28/20 279 lb (126.6 kg)      No results found for: TSH Lab Results  Component Value Date   WBC 9.0 12/25/2019   HGB 12.6 (L) 12/25/2019   HCT 40.5 12/25/2019   MCV 91.4 12/25/2019   PLT 308 12/25/2019   Lab Results  Component Value Date   NA 138 12/25/2019   K 3.9 12/25/2019   CO2 25 12/25/2019   GLUCOSE 135 (H) 12/25/2019   BUN 8 12/25/2019   CREATININE 0.92 12/25/2019   BILITOT 0.7 12/25/2019   ALKPHOS 78 12/25/2019   AST 14 (L) 12/25/2019   ALT 14 12/25/2019   PROT 7.3 12/25/2019   ALBUMIN 3.4 (L) 12/25/2019   CALCIUM 9.0 12/25/2019   ANIONGAP 7 12/25/2019   Lab Results  Component Value Date   CHOL 173 03/21/2019   Lab Results  Component Value Date   HDL 67 03/21/2019   Lab Results  Component Value Date   LDLCALC 90 03/21/2019   Lab Results  Component Value Date   TRIG 79 03/21/2019   Lab Results  Component Value Date   CHOLHDL 2.6 03/21/2019   Lab Results  Component Value  Date   HGBA1C 5.4 06/27/2020      Assessment & Plan:  1. Moderate persistent asthma without complication Refill provided for patient to continue use of generic Singulair as he has noticed that this tends to help with symptoms of allergy as well as asthma.  Patient reports that he has had a recent increase in sensation of shortness of breath and chest tightness and prescription provided for prednisone 20 mg daily x5 days if patient with continued or worsening of asthma symptoms.  Educational material on asthma presented as part of after visit summary. - montelukast (SINGULAIR) 10 MG tablet; Take 1 tablet (10 mg total) by mouth at bedtime.  Dispense: 30 tablet; Refill: 11 - predniSONE (DELTASONE) 20 MG tablet; 2 pills once per day for 5 days for wheezing/chest tightness. Take after eating  Dispense: 10 tablet; Refill: 1     Follow-up: Return in about 4 months (around 12/19/2020) for chronic issues; sooner if needed.    Antony Blackbird, MD

## 2020-08-21 NOTE — Progress Notes (Signed)
FOLLOW UP HYPERTENSION  SKIN SPOTS ON BOTH HANDS WORKS IN CHEMICAL PLANT BUT DOES NOT WORK AROUND THE CHEMICALS  NEEDS REFILL ON SINGULAR

## 2020-08-21 NOTE — Patient Instructions (Signed)
Asthma, Adult  Asthma is a long-term (chronic) condition in which the airways get tight and narrow. The airways are the breathing passages that lead from the nose and mouth down into the lungs. A person with asthma will have times when symptoms get worse. These are called asthma attacks. They can cause coughing, whistling sounds when you breathe (wheezing), shortness of breath, and chest pain. They can make it hard to breathe. There is no cure for asthma, but medicines and lifestyle changes can help control it. There are many things that can bring on an asthma attack or make asthma symptoms worse (triggers). Common triggers include:  Mold.  Dust.  Cigarette smoke.  Cockroaches.  Things that can cause allergy symptoms (allergens). These include animal skin flakes (dander) and pollen from trees or grass.  Things that pollute the air. These may include household cleaners, wood smoke, smog, or chemical odors.  Cold air, weather changes, and wind.  Crying or laughing hard.  Stress.  Certain medicines or drugs.  Certain foods such as dried fruit, potato chips, and grape juice.  Infections, such as a cold or the flu.  Certain medical conditions or diseases.  Exercise or tiring activities. Asthma may be treated with medicines and by staying away from the things that cause asthma attacks. Types of medicines may include:  Controller medicines. These help prevent asthma symptoms. They are usually taken every day.  Fast-acting reliever or rescue medicines. These quickly relieve asthma symptoms. They are used as needed and provide short-term relief.  Allergy medicines if your attacks are brought on by allergens.  Medicines to help control the body's defense (immune) system. Follow these instructions at home: Avoiding triggers in your home  Change your heating and air conditioning filter often.  Limit your use of fireplaces and wood stoves.  Get rid of pests (such as roaches and  mice) and their droppings.  Throw away plants if you see mold on them.  Clean your floors. Dust regularly. Use cleaning products that do not smell.  Have someone vacuum when you are not home. Use a vacuum cleaner with a HEPA filter if possible.  Replace carpet with wood, tile, or vinyl flooring. Carpet can trap animal skin flakes and dust.  Use allergy-proof pillows, mattress covers, and box spring covers.  Wash bed sheets and blankets every week in hot water. Dry them in a dryer.  Keep your bedroom free of any triggers.  Avoid pets and keep windows closed when things that cause allergy symptoms are in the air.  Use blankets that are made of polyester or cotton.  Clean bathrooms and kitchens with bleach. If possible, have someone repaint the walls in these rooms with mold-resistant paint. Keep out of the rooms that are being cleaned and painted.  Wash your hands often with soap and water. If soap and water are not available, use hand sanitizer.  Do not allow anyone to smoke in your home. General instructions  Take over-the-counter and prescription medicines only as told by your doctor. ? Talk with your doctor if you have questions about how or when to take your medicines. ? Make note if you need to use your medicines more often than usual.  Do not use any products that contain nicotine or tobacco, such as cigarettes and e-cigarettes. If you need help quitting, ask your doctor.  Stay away from secondhand smoke.  Avoid doing things outdoors when allergen counts are high and when air quality is low.  Wear a ski mask   when doing outdoor activities in the winter. The mask should cover your nose and mouth. Exercise indoors on cold days if you can.  Warm up before you exercise. Take time to cool down after exercise.  Use a peak flow meter as told by your doctor. A peak flow meter is a tool that measures how well the lungs are working.  Keep track of the peak flow meter's readings.  Write them down.  Follow your asthma action plan. This is a written plan for taking care of your asthma and treating your attacks.  Make sure you get all the shots (vaccines) that your doctor recommends. Ask your doctor about a flu shot and a pneumonia shot.  Keep all follow-up visits as told by your doctor. This is important. Contact a doctor if:  You have wheezing, shortness of breath, or a cough even while taking medicine to prevent attacks.  The mucus you cough up (sputum) is thicker than usual.  The mucus you cough up changes from clear or white to yellow, green, gray, or bloody.  You have problems from the medicine you are taking, such as: ? A rash. ? Itching. ? Swelling. ? Trouble breathing.  You need reliever medicines more than 2-3 times a week.  Your peak flow reading is still at 50-79% of your personal best after following the action plan for 1 hour.  You have a fever. Get help right away if:  You seem to be worse and are not responding to medicine during an asthma attack.  You are short of breath even at rest.  You get short of breath when doing very little activity.  You have trouble eating, drinking, or talking.  You have chest pain or tightness.  You have a fast heartbeat.  Your lips or fingernails start to turn blue.  You are light-headed or dizzy, or you faint.  Your peak flow is less than 50% of your personal best.  You feel too tired to breathe normally. Summary  Asthma is a long-term (chronic) condition in which the airways get tight and narrow. An asthma attack can make it hard to breathe.  Asthma cannot be cured, but medicines and lifestyle changes can help control it.  Make sure you understand how to avoid triggers and how and when to use your medicines. This information is not intended to replace advice given to you by your health care provider. Make sure you discuss any questions you have with your health care provider. Document Revised:  11/24/2018 Document Reviewed: 10/26/2016 Elsevier Patient Education  2020 Elsevier Inc.  

## 2020-08-22 IMAGING — DX DG CHEST 2V
2 series · 2 of 2 positions shown · non-contrast
Comparison: 01/25/2017 chest radiograph.

CLINICAL DATA: Dyspnea, left rib pain for 1 year. Intermittent
cough.

EXAM:
CHEST - 2 VIEW

[chest pa]
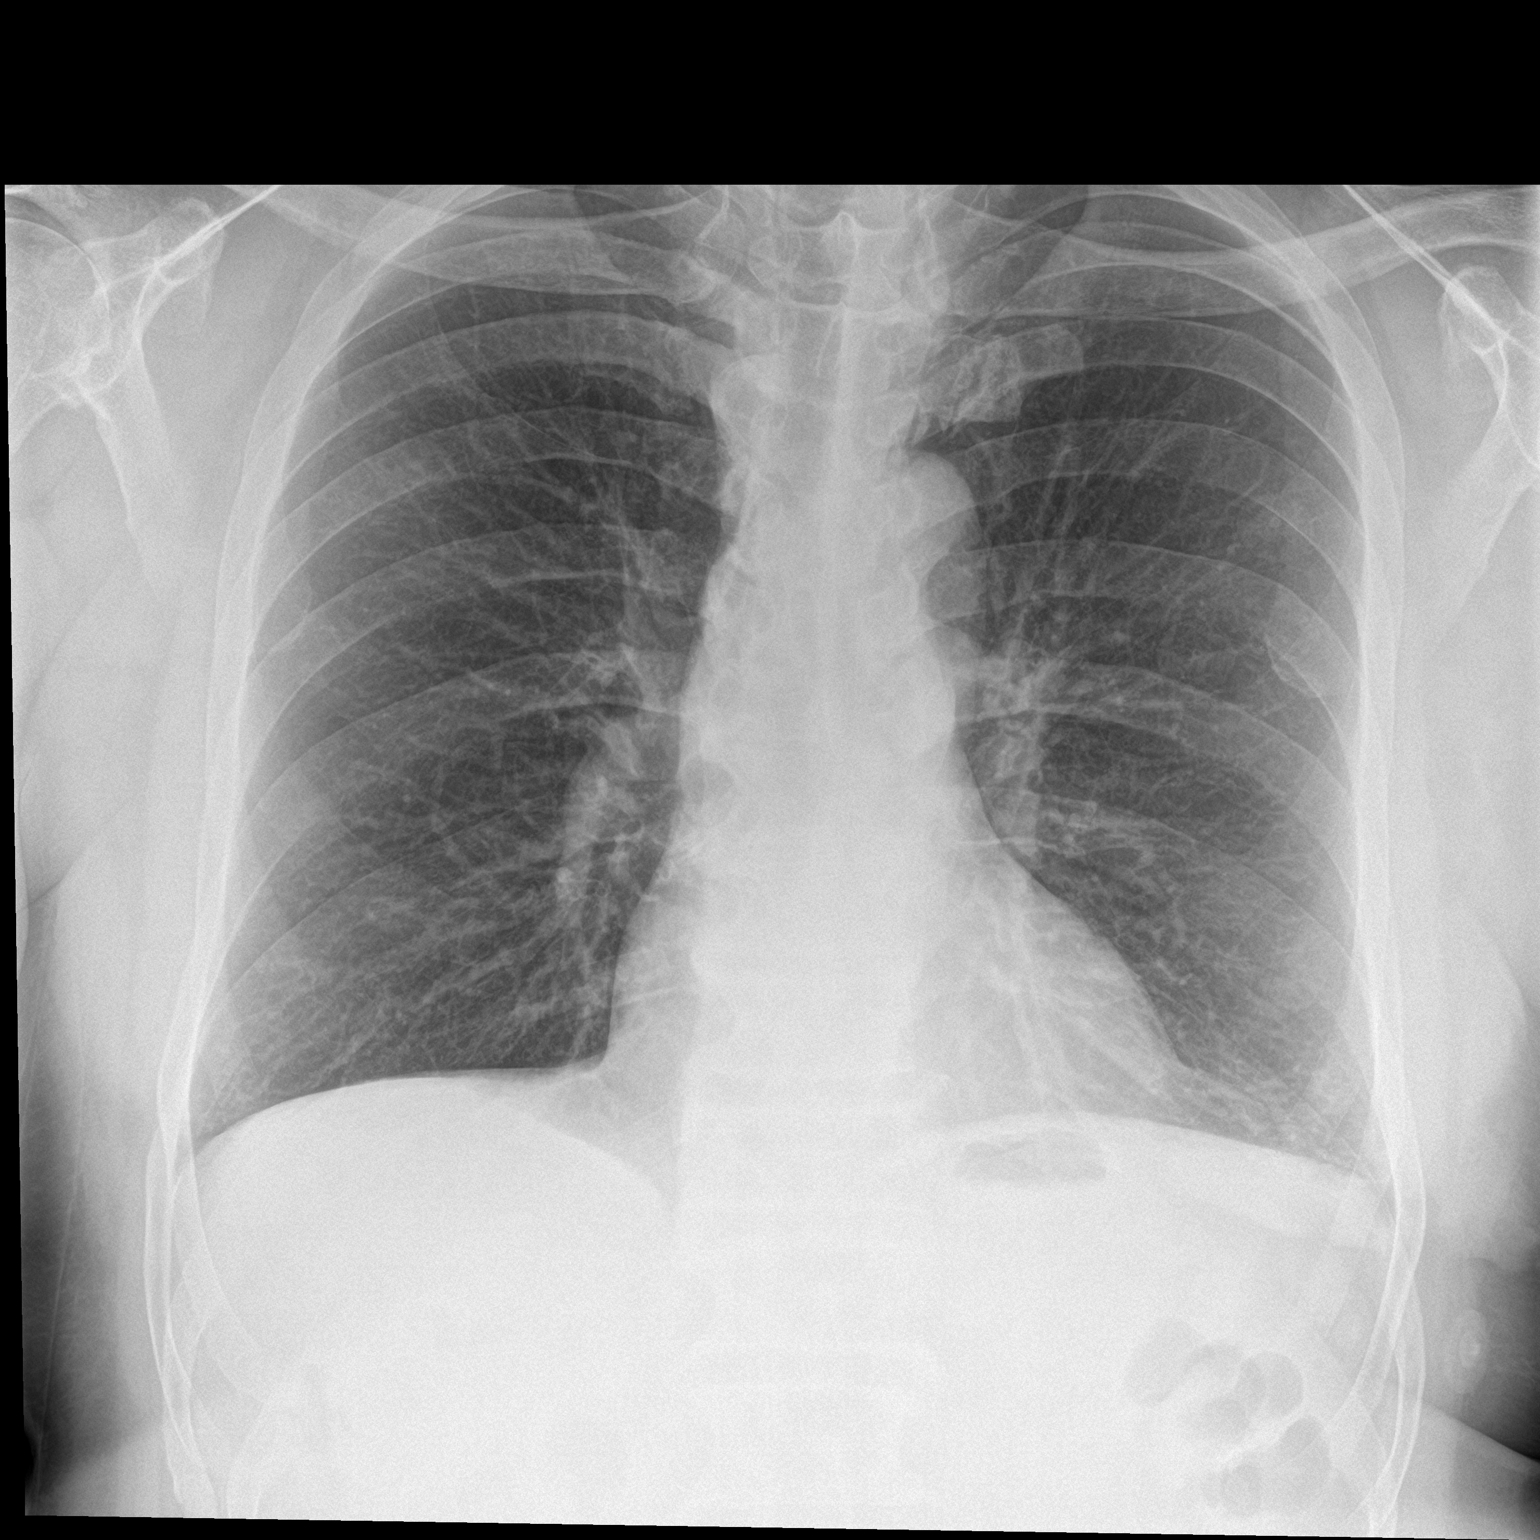

[chest lat]
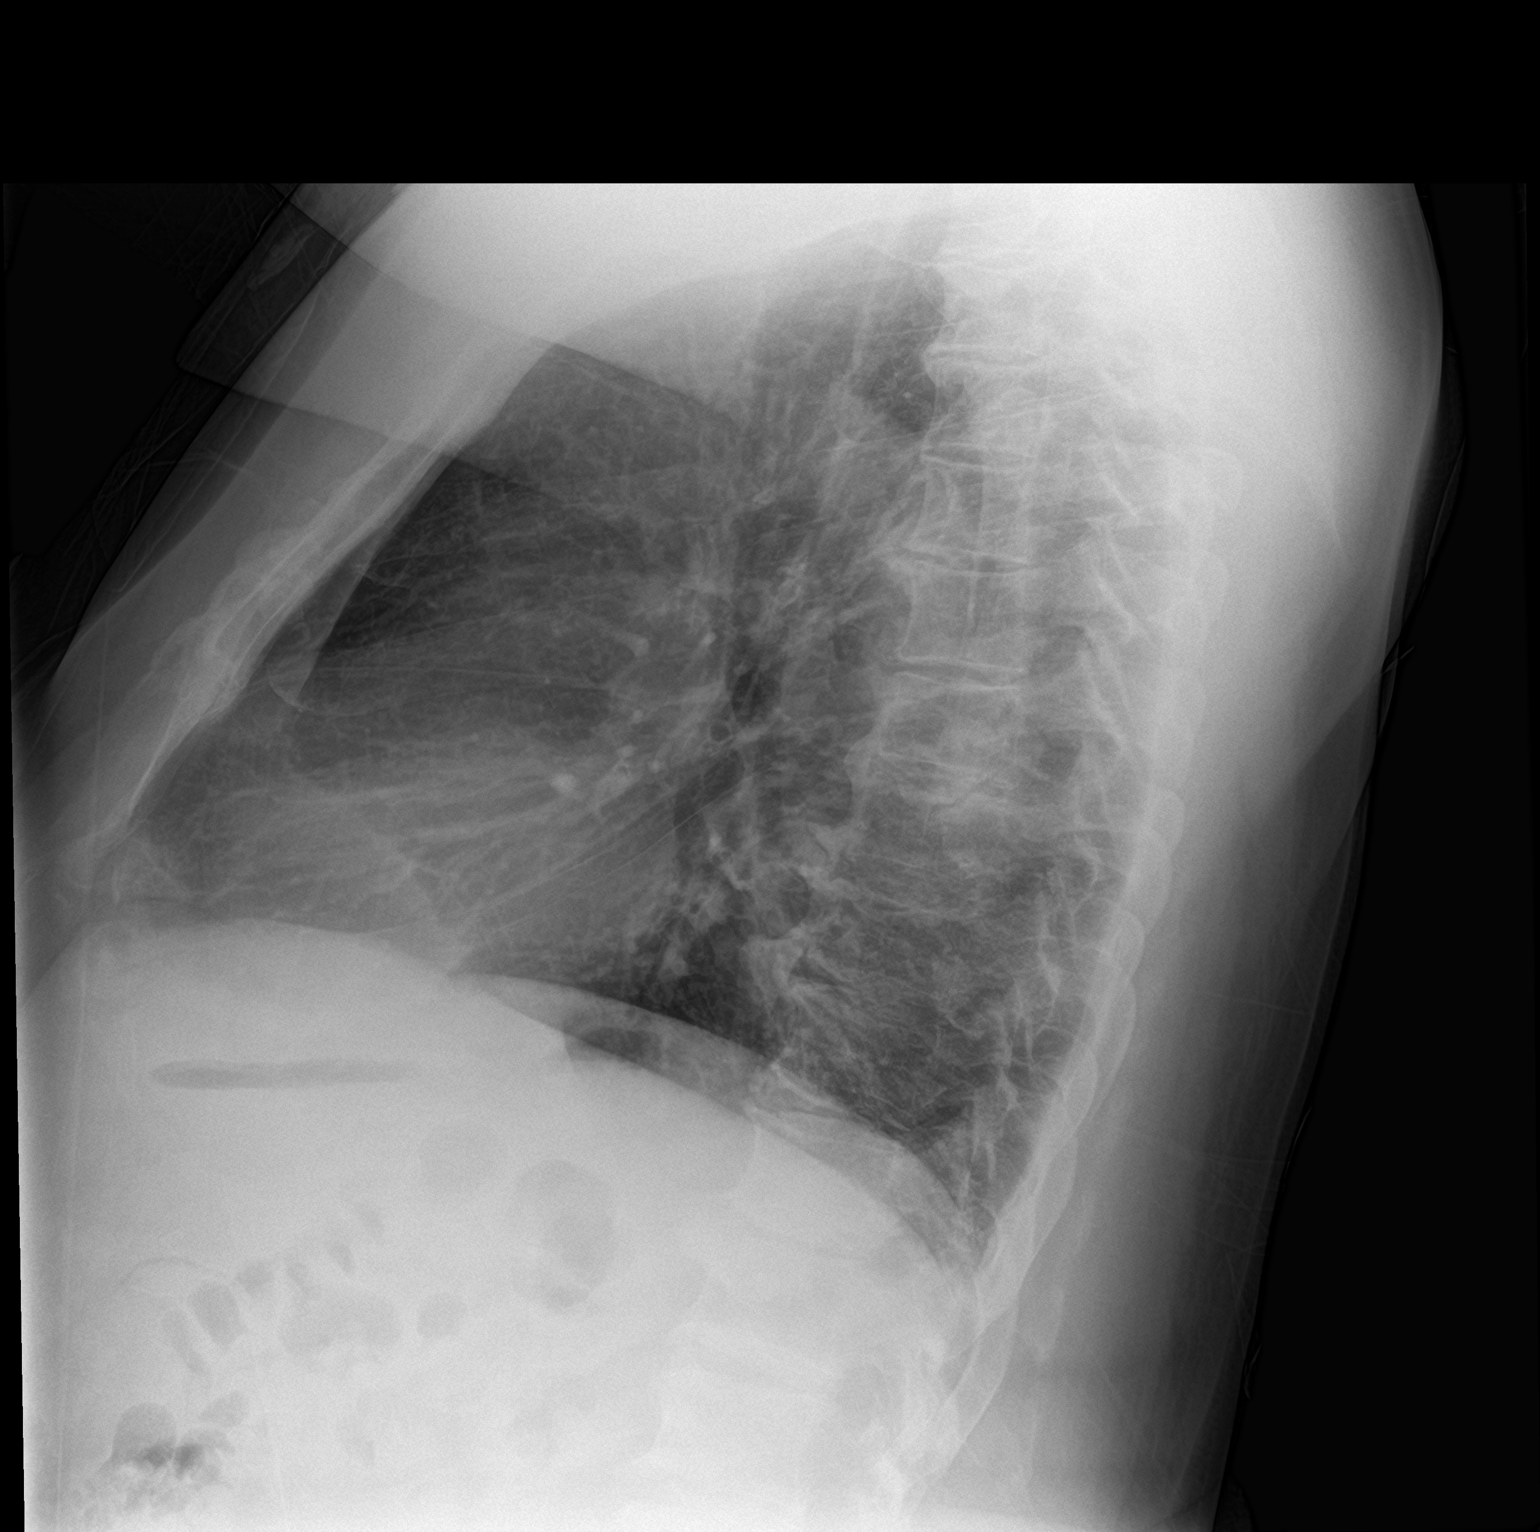

[2 of 2 positions shown; findings below may reference images not displayed]

FINDINGS: Stable cardiomediastinal silhouette with normal heart size. No
pneumothorax. No pleural effusion. Lungs appear clear, with no acute
consolidative airspace disease and no pulmonary edema.
IMPRESSION: No active cardiopulmonary disease.

## 2020-12-11 ENCOUNTER — Other Ambulatory Visit: Payer: Self-pay | Admitting: Family Medicine

## 2020-12-11 ENCOUNTER — Other Ambulatory Visit: Payer: Self-pay | Admitting: *Deleted

## 2020-12-11 DIAGNOSIS — I10 Essential (primary) hypertension: Secondary | ICD-10-CM

## 2020-12-11 MED ORDER — LOSARTAN POTASSIUM 50 MG PO TABS: 50.0000 mg | ORAL_TABLET | Freq: Every day | ORAL | 0 refills | Status: DC

## 2020-12-11 NOTE — Telephone Encounter (Signed)
Medication Refill - Medication:   losartan (COZAAR) 50 MG tablet   Has the patient contacted their pharmacy? Yes.   Needs to contact PCP.   Preferred Pharmacy (with phone number or street name):   [facility] ([place]), [place] - [address]  [address] [place] [address]  Phone: [phone] Fax: [phone]    Agent: Please be advised that RX refills may take up to 3 business days. We ask that you follow-up with your pharmacy.

## 2020-12-13 ENCOUNTER — Other Ambulatory Visit: Payer: Self-pay | Admitting: General Practice

## 2020-12-13 ENCOUNTER — Ambulatory Visit: Payer: Self-pay | Admitting: *Deleted

## 2020-12-13 DIAGNOSIS — I10 Essential (primary) hypertension: Secondary | ICD-10-CM

## 2020-12-13 MED ORDER — LOSARTAN POTASSIUM 50 MG PO TABS: 50.0000 mg | ORAL_TABLET | Freq: Every day | ORAL | 0 refills | Status: DC

## 2020-12-13 NOTE — Telephone Encounter (Signed)
Copied from [place] [id number]. Topic: Quick Communication - Rx Refill/Question >> Dec 13, 2020  9:45 AM Tessa Lerner A wrote: Medication: losartan (COZAAR) 50 MG tablet   Has the patient contacted their pharmacy? Yes. Patient has spoken previously with pharmacy and been told to contact PCP. Patient's PCP is no longer at practice.   Preferred Pharmacy (with phone number or street name): [facility] ([place]), [place] - [address] [place] [address] Phone: [phone]   Agent: Please be advised that RX refills may take up to 3 business days. We ask that you follow-up with your pharmacy.

## 2020-12-13 NOTE — Telephone Encounter (Signed)
Patient states he has had high BP reading due to being out of his BP medications patient states he is able to get one but not the other- call to pharmacy and they have 2 Rx for his B- he states he will pick them up and restart them. He does have follow up appointment at the office.  Reason for Disposition . Ran out of BP medications  Answer Assessment - Initial Assessment Questions 1. BLOOD PRESSURE: "What is the blood pressure?" "Did you take at least two measurements 5 minutes apart?"     194/98 (last night), 184/98 2. ONSET: "When did you take your blood pressure?"     Last night, day before 3. HOW: "How did you obtain the blood pressure?" (e.g., visiting nurse, automatic home BP monitor)     Automatic cuff- wrist 4. HISTORY: "Do you have a history of high blood pressure?"    yes 5. MEDICATIONS: "Are you taking any medications for blood pressure?" "Have you missed any doses recently?"     Ran out of BP medication-  2 weeks 6. OTHER SYMPTOMS: "Do you have any symptoms?" (e.g., headache, chest pain, blurred vision, difficulty breathing, weakness)     No symptoms 7. PREGNANCY: "Is there any chance you are pregnant?" "When was your last menstrual period?"     n/a  Protocols used: BLOOD PRESSURE - HIGH-A-AH

## 2020-12-13 NOTE — Telephone Encounter (Signed)
Script was not received that was sent on 12/11/2020 . Will resend

## 2020-12-19 ENCOUNTER — Ambulatory Visit: Payer: BC Managed Care – PPO | Admitting: Family Medicine

## 2020-12-25 ENCOUNTER — Ambulatory Visit: Payer: BC Managed Care – PPO | Admitting: Physician Assistant

## 2021-01-20 ENCOUNTER — Other Ambulatory Visit: Payer: Self-pay | Admitting: Physician Assistant

## 2021-01-20 DIAGNOSIS — I10 Essential (primary) hypertension: Secondary | ICD-10-CM

## 2021-01-20 NOTE — Telephone Encounter (Signed)
Requested medications are due for refill today.  yes  Requested medications are on the active medications list.  yes  Last refill. 12/13/2020  Future visit scheduled.   Yes  Notes to clinic.  Pt already given courtesy refill.

## 2021-01-30 ENCOUNTER — Ambulatory Visit: Payer: BC Managed Care – PPO | Attending: Physician Assistant | Admitting: Physician Assistant

## 2021-01-30 ENCOUNTER — Other Ambulatory Visit: Payer: Self-pay

## 2021-01-30 ENCOUNTER — Encounter: Payer: Self-pay | Admitting: Physician Assistant

## 2021-01-30 VITALS — BP 177/83 | HR 65 | Resp 17 | Ht 72.0 in | Wt 285.0 lb

## 2021-01-30 DIAGNOSIS — T7840XD Allergy, unspecified, subsequent encounter: Secondary | ICD-10-CM

## 2021-01-30 DIAGNOSIS — J302 Other seasonal allergic rhinitis: Secondary | ICD-10-CM | POA: Diagnosis not present

## 2021-01-30 DIAGNOSIS — J4540 Moderate persistent asthma, uncomplicated: Secondary | ICD-10-CM | POA: Diagnosis not present

## 2021-01-30 DIAGNOSIS — I10 Essential (primary) hypertension: Secondary | ICD-10-CM | POA: Diagnosis not present

## 2021-01-30 DIAGNOSIS — Z131 Encounter for screening for diabetes mellitus: Secondary | ICD-10-CM

## 2021-01-30 MED ORDER — FLUTICASONE PROPIONATE 50 MCG/ACT NA SUSP
2.0000 | Freq: Every day | NASAL | 6 refills | Status: DC
Start: 2021-01-30 — End: 2021-05-26

## 2021-01-30 MED ORDER — MONTELUKAST SODIUM 10 MG PO TABS: 10.0000 mg | ORAL_TABLET | Freq: Every day | ORAL | 11 refills | Status: DC

## 2021-01-30 MED ORDER — ALBUTEROL SULFATE HFA 108 (90 BASE) MCG/ACT IN AERS: 2.0000 | INHALATION_SPRAY | Freq: Four times a day (QID) | RESPIRATORY_TRACT | 5 refills | Status: DC | PRN

## 2021-01-30 MED ORDER — LOSARTAN POTASSIUM 100 MG PO TABS: 100.0000 mg | ORAL_TABLET | Freq: Every day | ORAL | 3 refills | Status: DC

## 2021-01-30 MED ORDER — AMLODIPINE BESYLATE 10 MG PO TABS: 10.0000 mg | ORAL_TABLET | Freq: Every day | ORAL | 0 refills | Status: DC

## 2021-01-30 MED ORDER — CETIRIZINE HCL 10 MG PO TABS: 10.0000 mg | ORAL_TABLET | Freq: Every day | ORAL | 11 refills | Status: DC

## 2021-01-30 NOTE — Progress Notes (Signed)
Edward Wilson, is a 60 y.o. male  FWY:[id number]  YDX:[id number]  DOB - Feb 04, 1961  Subjective:  Chief Complaint and HPI: Edward Wilson is a 60 y.o. male here today for med RF.  No new issues or concerns.    ROS:   Constitutional:  No f/c, No night sweats, No unexplained weight loss. EENT:  No vision changes, No blurry vision, No hearing changes. No mouth, throat, or ear problems.  Respiratory: No cough, No SOB/some wheezing Cardiac: No CP, no palpitations GI:  No abd pain, No N/V/D. GU: No Urinary s/sx Musculoskeletal: No joint pain Neuro: No headache, no dizziness, no motor weakness.  Skin: No rash Endocrine:  No polydipsia. No polyuria.  Psych: Denies SI/HI  No problems updated.  ALLERGIES: Allergies  Allergen Reactions  . Shellfish Allergy Hives    PAST MEDICAL HISTORY: Past Medical History:  Diagnosis Date  . Anemia   . Asthma   . GERD (gastroesophageal reflux disease)   . Hyperlipidemia   . Hypertension     MEDICATIONS AT HOME: Prior to Admission medications   Medication Sig Start Date End Date Taking? Authorizing Provider  cetirizine (ZYRTEC) 10 MG tablet Take 1 tablet (10 mg total) by mouth daily. 01/30/21  Yes Freeman Caldron M, PA-C  losartan (COZAAR) 100 MG tablet Take 1 tablet (100 mg total) by mouth daily. 01/30/21  Yes Freeman Caldron M, PA-C  triamcinolone cream (KENALOG) 0.1 % Apply two times daily to arms 06/27/20  Yes Elsie Stain, MD  albuterol (VENTOLIN HFA) 108 (90 Base) MCG/ACT inhaler Inhale 2 puffs into the lungs every 6 (six) hours as needed for wheezing or shortness of breath. 01/30/21   Argentina Donovan, PA-C  amLODipine (NORVASC) 10 MG tablet Take 1 tablet (10 mg total) by mouth daily. 01/30/21 04/30/21  Argentina Donovan, PA-C  fluticasone (FLONASE) 50 MCG/ACT nasal spray Place 2 sprays into both nostrils daily. 01/30/21   Argentina Donovan, PA-C  montelukast (SINGULAIR) 10 MG tablet Take 1 tablet (10 mg total) by mouth at bedtime. 01/30/21    Argentina Donovan, PA-C  NON FORMULARY lipozene for wt. loss Patient not taking: Reported on 01/30/2021    [provider]     Objective:  EXAM:   Vitals:   01/30/21 1605  BP: (!) 177/83  Pulse: 65  Resp: 17  SpO2: 95%  Weight: 285 lb (129.3 kg)  Height: 6' (1.829 m)    General appearance : A&OX3. NAD. Non-toxic-appearing HEENT: Atraumatic and Normocephalic.  PERRLA. EOM intact.  Neck: supple, no JVD. No cervical lymphadenopathy. No thyromegaly Chest/Lungs:  Breathing-non-labored, Good air entry bilaterally, breath sounds normal without rales, rhonchi, or wheezing  CVS: S1 S2 regular, no murmurs, gallops, rubs  Extremities: Bilateral Lower Ext shows no edema, both legs are warm to touch with = pulse throughout Neurology:  CN II-XII grossly intact, Non focal.   Psych:  TP linear. J/I WNL. Normal speech. Appropriate eye contact and affect.  Skin:  No Rash  Data Review Lab Results  Component Value Date   HGBA1C 5.4 06/27/2020   HGBA1C 5.5 06/11/2017   HGBA1C 5.90 03/15/2015     Assessment & Plan    1. Essential hypertension Uncontrolled-increase losartan from 50 to 100mg  and continue amlodipine - amLODipine (NORVASC) 10 MG tablet; Take 1 tablet (10 mg total) by mouth daily.  Dispense: 90 tablet; Refill: 0 - losartan (COZAAR) 100 MG tablet; Take 1 tablet (100 mg total) by mouth daily.  Dispense: 90 tablet; Refill:  3 - Hemoglobin A1c - Comprehensive metabolic panel - Lipid panel  3. Moderate persistent asthma without complication - albuterol (VENTOLIN HFA) 108 (90 Base) MCG/ACT inhaler; Inhale 2 puffs into the lungs every 6 (six) hours as needed for wheezing or shortness of breath.  Dispense: 18 g; Refill: 5 - fluticasone (FLONASE) 50 MCG/ACT nasal spray; Place 2 sprays into both nostrils daily.  Dispense: 16 g; Refill: 6 - montelukast (SINGULAIR) 10 MG tablet; Take 1 tablet (10 mg total) by mouth at bedtime.  Dispense: 30 tablet; Refill: 11  4. Allergy,  subsequent encounter - fluticasone (FLONASE) 50 MCG/ACT nasal spray; Place 2 sprays into both nostrils daily.  Dispense: 16 g; Refill: 6 - montelukast (SINGULAIR) 10 MG tablet; Take 1 tablet (10 mg total) by mouth at bedtime.  Dispense: 30 tablet; Refill: 11 - cetirizine (ZYRTEC) 10 MG tablet; Take 1 tablet (10 mg total) by mouth daily.  Dispense: 30 tablet; Refill: 11  5. Screening for diabetes mellitus I have had a lengthy discussion and provided education about insulin resistance and the intake of too much sugar/refined carbohydrates.  I have advised the patient to work at a goal of eliminating sugary drinks, candy, desserts, sweets, refined sugars, processed foods, and white carbohydrates.  The patient expresses understanding.   - Hemoglobin A1c - Lipid panel   Patient have been counseled extensively about nutrition and exercise  Return in about 4 months (around 06/01/2021) for assign new PCP (previously [facility]).  The patient was given clear instructions to go to ER or return to medical center if symptoms don't improve, worsen or new problems develop. The patient verbalized understanding. The patient was told to call to get lab results if they haven't heard anything in the next week.     Freeman Caldron, PA-C [facility] and [facility], [place]   01/30/2021, 4:43 PMPatient ID: Edward Wilson, male   DOB: 1960/10/15, 60 y.o.   MRN: [id number]

## 2021-01-31 LAB — HEMOGLOBIN A1C
Est. average glucose Bld gHb Est-mCnc: 126 mg/dL
Hgb A1c MFr Bld: 6 % — ABNORMAL HIGH (ref 4.8–5.6)

## 2021-01-31 LAB — LIPID PANEL
Chol/HDL Ratio: 2.7 ratio (ref 0.0–5.0)
Cholesterol, Total: 195 mg/dL (ref 100–199)
HDL: 73 mg/dL (ref >39)
LDL Chol Calc (NIH): 106 mg/dL — ABNORMAL HIGH (ref 0–99)
Triglycerides: 90 mg/dL (ref 0–149)
VLDL Cholesterol Cal: 16 mg/dL (ref 5–40)

## 2021-01-31 LAB — COMPREHENSIVE METABOLIC PANEL
ALT: 11 IU/L (ref 0–44)
AST: 14 IU/L (ref 0–40)
Albumin/Globulin Ratio: 1.4 (ref 1.2–2.2)
Albumin: 4.4 g/dL (ref 3.8–4.8)
Alkaline Phosphatase: 99 IU/L (ref 44–121)
BUN/Creatinine Ratio: 12 (ref 10–24)
BUN: 11 mg/dL (ref 8–27)
Bilirubin Total: 0.3 mg/dL (ref 0.0–1.2)
CO2: 25 mmol/L (ref 20–29)
Calcium: 9.8 mg/dL (ref 8.6–10.2)
Chloride: 105 mmol/L (ref 96–106)
Creatinine, Ser: 0.94 mg/dL (ref 0.76–1.27)
Globulin, Total: 3.2 g/dL (ref 1.5–4.5)
Glucose: 83 mg/dL (ref 65–99)
Potassium: 4.5 mmol/L (ref 3.5–5.2)
Sodium: 143 mmol/L (ref 134–144)
Total Protein: 7.6 g/dL (ref 6.0–8.5)
eGFR: 92 mL/min/{1.73_m2} (ref >59)

## 2021-03-17 ENCOUNTER — Ambulatory Visit (HOSPITAL_COMMUNITY)
Admission: EM | Admit: 2021-03-17 | Discharge: 2021-03-17 | Disposition: A | Discharge status: Home / Self Care | Payer: BC Managed Care – PPO | Attending: Physician Assistant | Admitting: Physician Assistant

## 2021-03-17 ENCOUNTER — Encounter (HOSPITAL_COMMUNITY): Payer: Self-pay

## 2021-03-17 ENCOUNTER — Other Ambulatory Visit: Payer: Self-pay

## 2021-03-17 DIAGNOSIS — M545 Low back pain, unspecified: Secondary | ICD-10-CM | POA: Diagnosis not present

## 2021-03-17 MED ORDER — KETOROLAC TROMETHAMINE 30 MG/ML IJ SOLN
INTRAMUSCULAR | Status: AC
  Filled 2021-03-17: qty 1

## 2021-03-17 MED ORDER — NAPROXEN 375 MG PO TABS: 375.0000 mg | ORAL_TABLET | Freq: Two times a day (BID) | ORAL | 0 refills | Status: DC

## 2021-03-17 MED ORDER — TIZANIDINE HCL 4 MG PO CAPS: 4.0000 mg | ORAL_CAPSULE | Freq: Three times a day (TID) | ORAL | 0 refills | Status: DC | PRN

## 2021-03-17 MED ORDER — KETOROLAC TROMETHAMINE 30 MG/ML IJ SOLN
30.0000 mg | Freq: Once | INTRAMUSCULAR | Status: AC
  Administered 2021-03-17: 30 mg via INTRAMUSCULAR

## 2021-03-17 NOTE — ED Triage Notes (Signed)
Pt c/o low back pain ongoing for years but worsening since Thursday. Reports taking 600mg  ibuprofen, Aleve but with minimal relief. Reports pain worse when moving certain ways.

## 2021-03-17 NOTE — ED Provider Notes (Signed)
[facility]    CSN: [id number] Arrival date & time: 03/17/21  3009      History   Chief Complaint Chief Complaint  Patient presents with   Back Pain    HPI Edward Wilson is a 60 y.o. male.   Patient presents today with a prolonged history (several years) intermittent back pain that has worsened in the past 1 week.  He denies injury or increase in activity prior to symptom onset.  Does report he has a physically demanding job that requires him to bend forward frequently throughout the day.  Reports pain is rated 10 on a 0-10 pain scale, localized to lumbar back without radiation, described as aching periodic sharp pains, worse with certain movements or transitioning positions, no alleviating factors identified.  He has tried ibuprofen and Aleve without improvement of symptoms.  Denies history of back surgery or injury.  Denies history of malignancy.  He denies any bowel/bladder incontinence, lower extremity weakness, saddle anesthesia.  He is having difficulty with daily activities as result of symptoms.   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 Authorizing Provider  albuterol (VENTOLIN HFA) 108 (90 Base) MCG/ACT inhaler Inhale 2 puffs into the  lungs every 6 (six) hours as needed for wheezing or shortness of breath. 01/30/21  Yes Freeman Caldron M, PA-C  amLODipine (NORVASC) 10 MG tablet Take 1 tablet (10 mg total) by mouth daily. 01/30/21 04/30/21 Yes McClung, Dionne Bucy, PA-C  losartan (COZAAR) 100 MG tablet Take 1 tablet (100 mg total) by mouth daily. 01/30/21  Yes McClung, Angela M, PA-C  montelukast (SINGULAIR) 10 MG tablet Take 1 tablet (10 mg total) by mouth at bedtime. 01/30/21  Yes McClung, Dionne Bucy, PA-C  naproxen (NAPROSYN) 375 MG tablet Take 1 tablet (375 mg total) by mouth 2 (two) times daily. 03/17/21  Yes Sherald Balbuena K, PA-C  tiZANidine (ZANAFLEX) 4 MG capsule Take 1 capsule (4 mg total) by mouth 3 (three) times daily as needed for muscle spasms. 03/17/21  Yes Yedidya Duddy K, PA-C  cetirizine (ZYRTEC) 10 MG tablet Take 1 tablet (10 mg total) by mouth daily. 01/30/21   Argentina Donovan, PA-C  fluticasone (FLONASE) 50 MCG/ACT nasal spray Place 2 sprays into both nostrils daily. 01/30/21   Argentina Donovan, PA-C  NON FORMULARY lipozene for wt. loss Patient not taking: Reported on 01/30/2021    [provider]  triamcinolone cream (KENALOG) 0.1 % Apply two times daily to arms 06/27/20   Elsie Stain, MD    Family History Family History  Problem Relation Age of Onset   Heart failure Mother        heart attack   Heart attack Mother    Hypertension  Maternal Aunt    Heart Problems Maternal Aunt    Colon cancer Neg Hx    Stomach cancer Neg Hx    Esophageal cancer Neg Hx    Rectal cancer Neg Hx     Social History Social History   Tobacco Use   Smoking status: Some Days    Packs/day: 0.10    Pack years: 0.00    Types: Cigarettes    Last attempt to quit: 01/25/2017    Years since quitting: 4.1   Smokeless tobacco: Never   Tobacco comments:    0.5 pack a week 02/06/20/ER  Vaping Use   Vaping Use: Never used  Substance Use Topics   Alcohol use: Yes    Comment: 12 pack of beer per week   Drug use: No      Allergies   Shellfish allergy   Review of Systems Review of Systems  Constitutional:  Positive for activity change. Negative for appetite change, fatigue and fever.  Respiratory:  Negative for cough and shortness of breath.   Cardiovascular:  Negative for chest pain.  Gastrointestinal:  Negative for abdominal pain, diarrhea, nausea and vomiting.  Genitourinary:  Negative for decreased urine volume and urgency.  Musculoskeletal:  Positive for back pain. Negative for arthralgias and myalgias.  Neurological:  Negative for dizziness, light-headedness, numbness and headaches.    Physical Exam Triage Vital Signs ED Triage Vitals  Enc Vitals Group     BP 03/17/21 0918 (!) 159/74     Pulse Rate 03/17/21 0918 60     Resp 03/17/21 0918 18     Temp 03/17/21 0918 97.9 F (36.6 C)     Temp src --      SpO2 03/17/21 0918 96 %     Weight --      Height --      Head Circumference --      Peak Flow --      Pain Score 03/17/21 0912 10     Pain Loc --      Pain Edu? --      Excl. in [place]? --    No data found.  Updated Vital Signs BP (!) 159/74   Pulse 60   Temp 97.9 F (36.6 C)   Resp 18   SpO2 96%   Visual Acuity Right Eye Distance:   Left Eye Distance:   Bilateral Distance:    Right Eye Near:   Left Eye Near:    Bilateral Near:     Physical Exam Vitals reviewed.  Constitutional:      General: He is awake.     Appearance: Normal appearance. He is normal weight. He is not ill-appearing.     Comments: Very pleasant male appears stated age in no acute distress  HENT:     Head: Normocephalic and atraumatic.  Cardiovascular:     Rate and Rhythm: Normal rate and regular rhythm.     Heart sounds: Normal heart sounds, S1 normal and S2 normal. No murmur heard. Pulmonary:     Effort: Pulmonary effort is normal.     Breath sounds: Normal breath sounds. No stridor. No wheezing, rhonchi or rales.     Comments: Clear to auscultation bilaterally Musculoskeletal:      Cervical back: No tenderness or bony tenderness.     Thoracic back: No tenderness or bony tenderness.     Lumbar back: Tenderness present. No bony tenderness. Negative right straight leg raise test and negative left straight leg raise test.  Comments: Tenderness palpation of lumbar paraspinal muscles.  No pain percussion of vertebrae.  Decreased range of motion with forward flexion, rotation, extension.  Negative straight leg rise and [place] bilaterally.  Neurological:     Mental Status: He is alert.  Psychiatric:        Behavior: Behavior is cooperative.     UC Treatments / Results  Labs (all labs ordered are listed, but only abnormal results are displayed) Labs Reviewed - No data to display  EKG   Radiology No results found.  Procedures Procedures (including critical care time)  Medications Ordered in UC Medications  ketorolac (TORADOL) 30 MG/ML injection 30 mg (has no administration in time range)    Initial Impression / Assessment and Plan / UC Course  I have reviewed the triage vital signs and the nursing notes.  Pertinent labs & imaging results that were available during my care of the patient were reviewed by me and considered in my medical decision making (see chart for details).      No indication for x-rays given atraumatic injury, no alarm symptoms, and no bony tenderness on exam.  Patient was given Toradol 30 mg in clinic with instruction not to take NSAIDs for 24 hours.  He was prescribed Naprosyn that he can begin tomorrow with instruction not to take additional NSAIDs with this medication due to risk of GI bleeding.  He was prescribed Zanaflex to be taken up to 3 times a day as needed with instruction not to drive or drink alcohol with this as drowsiness is a common side effect.  Encouraged him to use conservative treatment measures including heat, rest, stretch for additional symptom relief.  Discussed that if symptoms do not improve he will need to consider  physical therapy referral and/or MRI which need to be arranged through PCP.  Discussed alarm symptoms that warrant emergent evaluation.  Strict return precautions given to which patient expressed understanding.  Final Clinical Impressions(s) / UC Diagnoses   Final diagnoses:  Acute bilateral low back pain without sciatica     Discharge Instructions      You were given Toradol in clinic today so please do not take any NSAIDs (ibuprofen/Advil, naproxen/Aleve, aspirin) for the rest of today.  You can begin Naprosyn as prescribed tomorrow every 12 hours.  You should not take additional NSAIDs with this medication as it can lead to stomach bleeding.  You can take tizanidine (Zanaflex) up to 3 times a day but this will make you sleepy so do not drive or drink alcohol with it.  I recommend you use heat, stretch, rest for additional symptom relief.  As we discussed, follow-up with your primary care provider if symptoms or not improving as you may need to consider physical therapy referral or MRI.  If you have any worsening symptoms please return for reevaluation.     ED Prescriptions     Medication Sig Dispense Auth. Provider   tiZANidine (ZANAFLEX) 4 MG capsule Take 1 capsule (4 mg total) by mouth 3 (three) times daily as needed for muscle spasms. 30 capsule Emireth Cockerham K, PA-C   naproxen (NAPROSYN) 375 MG tablet Take 1 tablet (375 mg total) by mouth 2 (two) times daily. 20 tablet Aicia Babinski, Derry Skill, PA-C      PDMP not reviewed this encounter.   Terrilee Croak, PA-C 03/17/21 1012

## 2021-03-17 NOTE — Discharge Instructions (Addendum)
You were given Toradol in clinic today so please do not take any NSAIDs (ibuprofen/Advil, naproxen/Aleve, aspirin) for the rest of today.  You can begin Naprosyn as prescribed tomorrow every 12 hours.  You should not take additional NSAIDs with this medication as it can lead to stomach bleeding.  You can take tizanidine (Zanaflex) up to 3 times a day but this will make you sleepy so do not drive or drink alcohol with it.  I recommend you use heat, stretch, rest for additional symptom relief.  As we discussed, follow-up with your primary care provider if symptoms or not improving as you may need to consider physical therapy referral or MRI.  If you have any worsening symptoms please return for reevaluation.

## 2021-05-24 NOTE — Progress Notes (Signed)
Established Patient Office Visit  Subjective:  Patient ID: Edward Wilson, male    DOB: [birth year]  Age: 60 y.o. MRN: [id number]  CC:  Chief Complaint  Patient presents with   Establish Care   Hypertension    HPI Edward Wilson presents for tfr care from Dr Chapman Fitch for pcp The patient has a long standing history of asthma and psoriatic skin conditions.  Patient also has a history of hypertension as well.  On arrival blood pressure is 133/75.  He formerly used alcohol to excess but no longer is drinking significantly.  He is still smoking but down to 5 cigarettes a weekly.  Patient did see provider Mcclung in April as documented below.  Patient is a former Dr. Chapman Fitch patient Saw McClung 01/2021 1. Essential hypertension Uncontrolled-increase losartan from 50 to 144m and continue amlodipine - amLODipine (NORVASC) 10 MG tablet; Take 1 tablet (10 mg total) by mouth daily.  Dispense: 90 tablet; Refill: 0 - losartan (COZAAR) 100 MG tablet; Take 1 tablet (100 mg total) by mouth daily.  Dispense: 90 tablet; Refill: 3 - Hemoglobin A1c - Comprehensive metabolic panel - Lipid panel   3. Moderate persistent asthma without complication - albuterol (VENTOLIN HFA) 108 (90 Base) MCG/ACT inhaler; Inhale 2 puffs into the lungs every 6 (six) hours as needed for wheezing or shortness of breath.  Dispense: 18 g; Refill: 5 - fluticasone (FLONASE) 50 MCG/ACT nasal spray; Place 2 sprays into both nostrils daily.  Dispense: 16 g; Refill: 6 - montelukast (SINGULAIR) 10 MG tablet; Take 1 tablet (10 mg total) by mouth at bedtime.  Dispense: 30 tablet; Refill: 11   4. Allergy, subsequent encounter - fluticasone (FLONASE) 50 MCG/ACT nasal spray; Place 2 sprays into both nostrils daily.  Dispense: 16 g; Refill: 6 - montelukast (SINGULAIR) 10 MG tablet; Take 1 tablet (10 mg total) by mouth at bedtime.  Dispense: 30 tablet; Refill: 11 - cetirizine (ZYRTEC) 10 MG tablet; Take 1 tablet (10 mg total) by mouth daily.   Dispense: 30 tablet; Refill: 11   5. Screening for diabetes mellitus I have had a lengthy discussion and provided education about insulin resistance and the intake of too much sugar/refined carbohydrates.  I have advised the patient to work at a goal of eliminating sugary drinks, candy, desserts, sweets, refined sugars, processed foods, and white carbohydrates.  The patient expresses understanding.    - Hemoglobin A1c - Lipid panel   The patient's hemoglobin A1c was 6 in April he is concentrating on dietary control and is joining a gym.  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  He would like to go back to the dermatologist for his psoriasis.  [facility]  He is due a pneumococcal vaccine and agrees to receive this today.   Past Medical History:  Diagnosis Date   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[place] 02/06/2020   Anemia    Asthma    GERD (gastroesophageal reflux disease)    Hyperlipidemia    Hypertension     Past Surgical History:  Procedure Laterality Date   COLONOSCOPY  2016   POLYPECTOMY  2016    Family History  Problem Relation Age of Onset   Heart failure Mother        heart attack   Heart attack Mother    Hypertension Maternal Aunt    Heart Problems Maternal Aunt    Colon cancer Neg  Hx    Stomach cancer Neg Hx    Esophageal cancer Neg Hx    Rectal cancer Neg Hx     Social History   Socioeconomic History   Marital status: Single    Spouse name: Not on file   Number of children: Not on file   Years of education: Not on file   Highest education level: Not on file  Occupational History   Not on file  Tobacco Use   Smoking status: Some Days    Packs/day: 0.10    Types: Cigarettes    Last attempt to quit: 01/25/2017    Years since quitting: 4.3   Smokeless tobacco: Never   Tobacco comments:    0.5 pack a week 02/06/20/ER  Vaping Use   Vaping  Use: Never used  Substance and Sexual Activity   Alcohol use: Yes    Comment: 12 pack of beer per week   Drug use: No   Sexual activity: Not on file  Other Topics Concern   Not on file  Social History Narrative   Not on file   Social Determinants of Health   Financial Resource Strain: Not on file  Food Insecurity: Not on file  Transportation Needs: Not on file  Physical Activity: Not on file  Stress: Not on file  Social Connections: Not on file  Intimate Partner Violence: Not on file    Outpatient Medications Prior to Visit  Medication Sig Dispense Refill   albuterol (VENTOLIN HFA) 108 (90 Base) MCG/ACT inhaler Inhale 2 puffs into the lungs every 6 (six) hours as needed for wheezing or shortness of breath. 18 g 5   amLODipine (NORVASC) 10 MG tablet Take 1 tablet (10 mg total) by mouth daily. 90 tablet 0   losartan (COZAAR) 100 MG tablet Take 1 tablet (100 mg total) by mouth daily. 90 tablet 3   montelukast (SINGULAIR) 10 MG tablet Take 1 tablet (10 mg total) by mouth at bedtime. 30 tablet 11   cetirizine (ZYRTEC) 10 MG tablet Take 1 tablet (10 mg total) by mouth daily. (Patient not taking: Reported on 05/26/2021) 30 tablet 11   fluticasone (FLONASE) 50 MCG/ACT nasal spray Place 2 sprays into both nostrils daily. (Patient not taking: Reported on 05/26/2021) 16 g 6   naproxen (NAPROSYN) 375 MG tablet Take 1 tablet (375 mg total) by mouth 2 (two) times daily. (Patient not taking: Reported on 05/26/2021) 20 tablet 0   NON FORMULARY lipozene for wt. loss (Patient not taking: No sig reported)     tiZANidine (ZANAFLEX) 4 MG capsule Take 1 capsule (4 mg total) by mouth 3 (three) times daily as needed for muscle spasms. (Patient not taking: Reported on 05/26/2021) 30 capsule 0   triamcinolone cream (KENALOG) 0.1 % Apply two times daily to arms 454 g 0   No facility-administered medications prior to visit.    Allergies  Allergen Reactions   Shellfish Allergy Anaphylaxis and Hives     Reports causes hives and feeling of throat closing, difficulty breathing    ROS Review of Systems  Constitutional: Negative.   HENT: Negative.    Respiratory: Negative.    Cardiovascular: Negative.   Gastrointestinal: Negative.   Genitourinary: Negative.   Musculoskeletal: Negative.   Skin:  Positive for rash.  Neurological: Negative.   Psychiatric/Behavioral: Negative.       Objective:    Physical Exam Constitutional:      Appearance: Normal appearance. He is normal weight.  HENT:     Head:  Normocephalic and atraumatic.     Nose: Nose normal.     Mouth/Throat:     Mouth: Mucous membranes are moist.     Pharynx: Oropharynx is clear.  Eyes:     Extraocular Movements: Extraocular movements intact.     Conjunctiva/sclera: Conjunctivae normal.     Pupils: Pupils are equal, round, and reactive to light.  Cardiovascular:     Rate and Rhythm: Normal rate and regular rhythm.     Pulses: Normal pulses.     Heart sounds: Normal heart sounds.  Pulmonary:     Effort: Pulmonary effort is normal.     Breath sounds: Normal breath sounds.  Abdominal:     General: Abdomen is flat. Bowel sounds are normal.  Musculoskeletal:     Cervical back: Normal range of motion and neck supple.  Skin:    Findings: Rash present.     Comments: Psoriatic rash on plantar aspect of both wrists and both knees  Neurological:     General: No focal deficit present.     Mental Status: He is alert and oriented to person, place, and time. Mental status is at baseline.  Psychiatric:        Mood and Affect: Mood normal.        Behavior: Behavior normal.        Thought Content: Thought content normal.    BP 133/75   Pulse 60   Resp 16   Ht 6' 5"  (1.956 m)   Wt 276 lb 6.4 oz (125.4 kg)   SpO2 96%   BMI 32.78 kg/m  Wt Readings from Last 3 Encounters:  05/26/21 276 lb 6.4 oz (125.4 kg)  01/30/21 285 lb (129.3 kg)  08/21/20 277 lb 12.8 oz (126 kg)     Health Maintenance Due  Topic Date Due    Pneumococcal Vaccine 6-61 Years old (1 - PCV) Never done   Zoster Vaccines- Shingrix (1 of 2) Never done   INFLUENZA VACCINE  05/05/2021    There are no preventive care reminders to display for this patient.  No results found for: TSH Lab Results  Component Value Date   WBC 9.0 12/25/2019   HGB 12.6 (L) 12/25/2019   HCT 40.5 12/25/2019   MCV 91.4 12/25/2019   PLT 308 12/25/2019   Lab Results  Component Value Date   NA 143 01/30/2021   K 4.5 01/30/2021   CO2 25 01/30/2021   GLUCOSE 83 01/30/2021   BUN 11 01/30/2021   CREATININE 0.94 01/30/2021   BILITOT 0.3 01/30/2021   ALKPHOS 99 01/30/2021   AST 14 01/30/2021   ALT 11 01/30/2021   PROT 7.6 01/30/2021   ALBUMIN 4.4 01/30/2021   CALCIUM 9.8 01/30/2021   ANIONGAP 7 12/25/2019   EGFR 92 01/30/2021   Lab Results  Component Value Date   CHOL 195 01/30/2021   Lab Results  Component Value Date   HDL 73 01/30/2021   Lab Results  Component Value Date   LDLCALC 106 (H) 01/30/2021   Lab Results  Component Value Date   TRIG 90 01/30/2021   Lab Results  Component Value Date   CHOLHDL 2.7 01/30/2021   Lab Results  Component Value Date   HGBA1C 6.0 (H) 01/30/2021      Assessment & Plan:   Problem List Items Addressed This Visit       Cardiovascular and Mediastinum   Essential hypertension    Hypertension well controlled at this time on amlodipine 10 mg daily and  losartan 100 mg daily  Will refill current medications      Relevant Medications   amLODipine (NORVASC) 10 MG tablet   losartan (COZAAR) 100 MG tablet     Respiratory   Asthma, chronic (Chronic)    Chronic asthma stable on as needed albuterol and Singulair      Relevant Medications   albuterol (VENTOLIN HFA) 108 (90 Base) MCG/ACT inhaler   montelukast (SINGULAIR) 10 MG tablet     Musculoskeletal and Integument   Psoriasis-like skin disease - Primary    Psoriatic skin condition will refer to dermatology and give Temovate to mix with skin  moisturizer in the interim      Relevant Orders   Ambulatory referral to Dermatology     Other   Tobacco use disorder (Chronic)       Current smoking consumption amount: 5 cigarettes a week  Dicsussion on advise to quit smoking and smoking impacts: Cardiovascular lung impacts  Patient's willingness to quit: Willing to quit  Methods to quit smoking discussed: Behavioral modification  Medication management of smoking session drugs discussed: Not indicated  Resources provided:  AVS   Setting quit date not established  Follow-up arranged 4 months   Time spent counseling the patient: 5 minutes       Prediabetes    Prediabetes patient given diet and exercise program we will hold off on metformin for now      Obesity (BMI 30.0-34.9)    Moderate obesity patient given dietary measures and exercise program he said he will follow      Other Visit Diagnoses     Moderate persistent asthma without complication       Relevant Medications   albuterol (VENTOLIN HFA) 108 (90 Base) MCG/ACT inhaler   montelukast (SINGULAIR) 10 MG tablet   Allergy, subsequent encounter       Relevant Medications   montelukast (SINGULAIR) 10 MG tablet   Need for pneumococcal vaccine       Relevant Orders   Pneumococcal conjugate vaccine 20-valent       Meds ordered this encounter  Medications   amLODipine (NORVASC) 10 MG tablet    Sig: Take 1 tablet (10 mg total) by mouth daily.    Dispense:  90 tablet    Refill:  2    To lower blood pressure   albuterol (VENTOLIN HFA) 108 (90 Base) MCG/ACT inhaler    Sig: Inhale 2 puffs into the lungs every 6 (six) hours as needed for wheezing or shortness of breath.    Dispense:  18 g    Refill:  5   losartan (COZAAR) 100 MG tablet    Sig: Take 1 tablet (100 mg total) by mouth daily.    Dispense:  90 tablet    Refill:  3   montelukast (SINGULAIR) 10 MG tablet    Sig: Take 1 tablet (10 mg total) by mouth at bedtime.    Dispense:  30 tablet     Refill:  11   clobetasol cream (TEMOVATE) 0.05 %    Sig: Apply 1 application topically 2 (two) times daily.    Dispense:  30 g    Refill:  1    Follow-up: Return in about 4 months (around 09/25/2021).    Asencion Noble, MD

## 2021-05-26 ENCOUNTER — Ambulatory Visit: Payer: BC Managed Care – PPO | Attending: Critical Care Medicine | Admitting: Critical Care Medicine

## 2021-05-26 ENCOUNTER — Encounter: Payer: Self-pay | Admitting: Critical Care Medicine

## 2021-05-26 ENCOUNTER — Other Ambulatory Visit: Payer: Self-pay

## 2021-05-26 VITALS — BP 133/75 | HR 60 | Resp 16 | Ht 77.0 in | Wt 276.4 lb

## 2021-05-26 DIAGNOSIS — Z23 Encounter for immunization: Secondary | ICD-10-CM | POA: Diagnosis not present

## 2021-05-26 DIAGNOSIS — J302 Other seasonal allergic rhinitis: Secondary | ICD-10-CM

## 2021-05-26 DIAGNOSIS — E669 Obesity, unspecified: Secondary | ICD-10-CM

## 2021-05-26 DIAGNOSIS — F17200 Nicotine dependence, unspecified, uncomplicated: Secondary | ICD-10-CM

## 2021-05-26 DIAGNOSIS — R7303 Prediabetes: Secondary | ICD-10-CM | POA: Insufficient documentation

## 2021-05-26 DIAGNOSIS — I10 Essential (primary) hypertension: Secondary | ICD-10-CM | POA: Diagnosis not present

## 2021-05-26 DIAGNOSIS — T7840XD Allergy, unspecified, subsequent encounter: Secondary | ICD-10-CM

## 2021-05-26 DIAGNOSIS — F17210 Nicotine dependence, cigarettes, uncomplicated: Secondary | ICD-10-CM

## 2021-05-26 DIAGNOSIS — L989 Disorder of the skin and subcutaneous tissue, unspecified: Secondary | ICD-10-CM

## 2021-05-26 DIAGNOSIS — J4540 Moderate persistent asthma, uncomplicated: Secondary | ICD-10-CM

## 2021-05-26 MED ORDER — MONTELUKAST SODIUM 10 MG PO TABS
10.0000 mg | ORAL_TABLET | Freq: Every day | ORAL | 11 refills | Status: DC
Start: 2021-05-26 — End: 2021-09-24

## 2021-05-26 MED ORDER — CLOBETASOL PROPIONATE 0.05 % EX CREA: 1.0000 "application " | TOPICAL_CREAM | Freq: Two times a day (BID) | CUTANEOUS | 1 refills | Status: DC

## 2021-05-26 MED ORDER — ALBUTEROL SULFATE HFA 108 (90 BASE) MCG/ACT IN AERS: 2.0000 | INHALATION_SPRAY | Freq: Four times a day (QID) | RESPIRATORY_TRACT | 5 refills | Status: DC | PRN

## 2021-05-26 MED ORDER — AMLODIPINE BESYLATE 10 MG PO TABS: 10.0000 mg | ORAL_TABLET | Freq: Every day | ORAL | 2 refills | Status: DC

## 2021-05-26 MED ORDER — LOSARTAN POTASSIUM 100 MG PO TABS: 100.0000 mg | ORAL_TABLET | Freq: Every day | ORAL | 3 refills | Status: DC

## 2021-05-26 NOTE — Patient Instructions (Signed)
Focus on smoking cessation  Brush your teeth once a day and obtain a dental appointment  Follow a healthy diet as outlined on the attachment and exercise by walking 30 minutes 3-4 times a week and join the [employer] as you discussed  Refills on all your medications sent to the [place]  Referral to dermatology was made [facility] medical  Pneumonia vaccine was given  Return to see Dr. Joya Gaskins 4 months  See attachments

## 2021-05-26 NOTE — Assessment & Plan Note (Signed)
Psoriatic skin condition will refer to dermatology and give Temovate to mix with skin moisturizer in the interim

## 2021-05-26 NOTE — Assessment & Plan Note (Signed)
Moderate obesity patient given dietary measures and exercise program he said he will follow

## 2021-05-26 NOTE — Assessment & Plan Note (Signed)
Hypertension well controlled at this time on amlodipine 10 mg daily and losartan 100 mg daily  Will refill current medications

## 2021-05-26 NOTE — Assessment & Plan Note (Signed)
  .   Current smoking consumption amount: 5 cigarettes a week  . Dicsussion on advise to quit smoking and smoking impacts: Cardiovascular lung impacts  . Patient's willingness to quit: Willing to quit  . Methods to quit smoking discussed: Behavioral modification  . Medication management of smoking session drugs discussed: Not indicated  . Resources provided:  AVS   . Setting quit date not established  . Follow-up arranged 4 months   Time spent counseling the patient: 5 minutes

## 2021-05-26 NOTE — Assessment & Plan Note (Signed)
Prediabetes patient given diet and exercise program we will hold off on metformin for now

## 2021-05-26 NOTE — Assessment & Plan Note (Addendum)
Chronic asthma stable on as needed albuterol and Singulair

## 2021-06-10 ENCOUNTER — Other Ambulatory Visit: Payer: Self-pay

## 2021-06-10 ENCOUNTER — Ambulatory Visit (HOSPITAL_COMMUNITY)
Admission: EM | Admit: 2021-06-10 | Discharge: 2021-06-10 | Disposition: A | Discharge status: Home / Self Care | Payer: BC Managed Care – PPO | Attending: Emergency Medicine | Admitting: Emergency Medicine

## 2021-06-10 ENCOUNTER — Encounter (HOSPITAL_COMMUNITY): Payer: Self-pay | Admitting: Emergency Medicine

## 2021-06-10 DIAGNOSIS — G8929 Other chronic pain: Secondary | ICD-10-CM | POA: Diagnosis not present

## 2021-06-10 DIAGNOSIS — M545 Low back pain, unspecified: Secondary | ICD-10-CM | POA: Diagnosis not present

## 2021-06-10 DIAGNOSIS — T63441A Toxic effect of venom of bees, accidental (unintentional), initial encounter: Secondary | ICD-10-CM

## 2021-06-10 MED ORDER — NAPROXEN 500 MG PO TABS: 500.0000 mg | ORAL_TABLET | Freq: Two times a day (BID) | ORAL | 0 refills | Status: DC

## 2021-06-10 NOTE — Discharge Instructions (Addendum)
Take the naproxen twice a day as needed for your low back pain. You can also take Tylenol.   You can use heat, ice, or alternate between heat and ice for comfort.   You can use IcyHot, lidocaine patches, or biofreeze as needed for pain relief.   Continue to use the anti-itch cream to help with your beesting.  Elevated your hand when you are sitting to laying down to help with the swelling.  You can also apply ice for 15-20 minutes every 4-6 hours.   Return or go to the Emergency Department if symptoms worsen or do not improve in the next few days.

## 2021-06-10 NOTE — ED Provider Notes (Signed)
[place]    CSN: [id number] Arrival date & time: 06/10/21  [id number]      History   Chief Complaint Chief Complaint  Patient presents with   Hand Pain   Back Pain    HPI Edward Wilson is a 60 y.o. male.   Patient here for evaluation of right hand swelling and itching after getting stung by a bee on Sunday afternoon.  Reports using an anti-itch cream with some relief.  Reports swelling has gotten better since Sunday.  Denies any other OTC medications or treatments.  Also reports lower back pain that has been chronic.  Reports using naproxen in the past which was helpful but states that he is is out of this medication.  Denies any numbness or tingling in his lower extremities.  Denies any loss of bowel or bladder control.  Denies any trauma, injury, or other precipitating event.  Denies any fevers, chest pain, shortness of breath, N/V/D, numbness, tingling, weakness, abdominal pain, or headaches.    The history is provided by the patient.  Hand Pain  Back Pain  Past Medical History:  Diagnosis Date   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[place]) 02/06/2020   Anemia    Asthma    GERD (gastroesophageal reflux disease)    Hyperlipidemia    Hypertension     Patient Active Problem List   Diagnosis Date Noted   Prediabetes 05/26/2021   Obesity (BMI 30.0-34.9) 05/26/2021   Psoriasis-like skin disease 06/27/2020   Hyperglycemia 06/27/2020   Pure hypercholesterolemia 09/18/2016   Asthma, chronic 08/07/2016   History of syphilis 10/14/2015   Essential hypertension 02/12/2015   Arthralgia 02/12/2015   Tobacco use disorder 02/12/2015   Alcohol use 02/12/2015    Past Surgical History:  Procedure Laterality Date   COLONOSCOPY  2016   POLYPECTOMY  2016       Home Medications    Prior to Admission medications   Medication Sig Start Date End Date Taking? Authorizing Provider  amLODipine (NORVASC) 10 MG tablet Take 1 tablet (10 mg total) by  mouth daily. 05/26/21 08/24/21 Yes Elsie Stain, MD  losartan (COZAAR) 100 MG tablet Take 1 tablet (100 mg total) by mouth daily. 05/26/21  Yes Elsie Stain, MD  montelukast (SINGULAIR) 10 MG tablet Take 1 tablet (10 mg total) by mouth at bedtime. 05/26/21  Yes Elsie Stain, MD  naproxen (NAPROSYN) 500 MG tablet Take 1 tablet (500 mg total) by mouth 2 (two) times daily. 06/10/21  Yes Pearson Forster, NP  albuterol (VENTOLIN HFA) 108 (90 Base) MCG/ACT inhaler Inhale 2 puffs into the lungs every 6 (six) hours as needed for wheezing or shortness of breath. 05/26/21   Elsie Stain, MD  clobetasol cream (TEMOVATE) [id number] % Apply 1 application topically 2 (two) times daily. 05/26/21   Elsie Stain, MD    Family History Family History  Problem Relation Age of Onset   Heart failure Mother        heart attack   Heart attack Mother    Hypertension Maternal Aunt    Heart Problems Maternal Aunt    Colon cancer Neg Hx    Stomach cancer Neg Hx    Esophageal cancer Neg Hx    Rectal cancer Neg Hx     Social History Social History   Tobacco Use   Smoking status: Some Days    Packs/day: 0.10    Types: Cigarettes    Last attempt to quit: 01/25/2017  Years since quitting: 4.3   Smokeless tobacco: Never   Tobacco comments:    0.5 pack a week 02/06/20/ER  Vaping Use   Vaping Use: Never used  Substance Use Topics   Alcohol use: Yes    Comment: 12 pack of beer per week   Drug use: No     Allergies   Shellfish allergy   Review of Systems Review of Systems  Musculoskeletal:  Positive for back pain.  Skin:  Positive for wound.  All other systems reviewed and are negative.   Physical Exam Triage Vital Signs ED Triage Vitals  Enc Vitals Group     BP 06/10/21 0924 (!) 141/69     Pulse Rate 06/10/21 0924 60     Resp 06/10/21 0924 (!) 22     Temp 06/10/21 0924 98.2 F (36.8 C)     Temp Source 06/10/21 0924 Oral     SpO2 06/10/21 0924 96 %     Weight --      Height --       Head Circumference --      Peak Flow --      Pain Score 06/10/21 0921 7     Pain Loc --      Pain Edu? --      Excl. in [place]? --    No data found.  Updated Vital Signs BP (!) 141/69 (BP Location: Right Arm) Comment (BP Location): large cuff  Pulse 60   Temp 98.2 F (36.8 C) (Oral)   Resp (!) 22   SpO2 96%   Visual Acuity Right Eye Distance:   Left Eye Distance:   Bilateral Distance:    Right Eye Near:   Left Eye Near:    Bilateral Near:     Physical Exam Vitals and nursing note reviewed.  Constitutional:      General: He is not in acute distress.    Appearance: Normal appearance. He is not ill-appearing, toxic-appearing or diaphoretic.  HENT:     Head: Normocephalic and atraumatic.  Eyes:     Conjunctiva/sclera: Conjunctivae normal.  Cardiovascular:     Rate and Rhythm: Normal rate.     Pulses: Normal pulses.  Pulmonary:     Effort: Pulmonary effort is normal.  Abdominal:     General: Abdomen is flat.  Musculoskeletal:        General: Normal range of motion.     Cervical back: Normal range of motion.     Lumbar back: Spasms and tenderness present. No bony tenderness. Negative right straight leg raise test and negative left straight leg raise test.  Skin:    General: Skin is warm and dry.     Findings: Wound (swelling noted to right hand, no obvious stings or wounds, no erythema or redness, no discharge/drainage) present.  Neurological:     General: No focal deficit present.     Mental Status: He is alert and oriented to person, place, and time.  Psychiatric:        Mood and Affect: Mood normal.     UC Treatments / Results  Labs (all labs ordered are listed, but only abnormal results are displayed) Labs Reviewed - No data to display  EKG   Radiology No results found.  Procedures Procedures (including critical care time)  Medications Ordered in UC Medications - No data to display  Initial Impression / Assessment and Plan / UC Course  I have  reviewed the triage vital signs and the nursing notes.  Pertinent  labs & imaging results that were available during my care of the patient were reviewed by me and considered in my medical decision making (see chart for details).    Assessment negative for red flags or concerns.  Bee sting.  Continue to use anti-itch cream as needed.  Recommend elevating hand when sitting or laying down.  May apply ice as needed to help with swelling.  Naproxen twice daily as needed for back pain.  May also take Tylenol.  Discussed conservative symptom management including heat, ice, or OTC treatments.  Follow-up as needed Final Clinical Impressions(s) / UC Diagnoses   Final diagnoses:  Bee sting, accidental or unintentional, initial encounter  Chronic bilateral low back pain without sciatica     Discharge Instructions      Take the naproxen twice a day as needed for your low back pain. You can also take Tylenol.   You can use heat, ice, or alternate between heat and ice for comfort.   You can use IcyHot, lidocaine patches, or biofreeze as needed for pain relief.   Continue to use the anti-itch cream to help with your beesting.  Elevated your hand when you are sitting to laying down to help with the swelling.  You can also apply ice for 15-20 minutes every 4-6 hours.   Return or go to the Emergency Department if symptoms worsen or do not improve in the next few days.      ED Prescriptions     Medication Sig Dispense Auth. Provider   naproxen (NAPROSYN) 500 MG tablet Take 1 tablet (500 mg total) by mouth 2 (two) times daily. 30 tablet Pearson Forster, NP      PDMP not reviewed this encounter.   Pearson Forster, NP 06/10/21 1008

## 2021-06-10 NOTE — ED Triage Notes (Signed)
Sunday evening reports being stung more than once.  Right hand is particularly swollen and itches  Patient has lower back pain, chronic.  Pain is intermittent, worse than usual lately.  Patient has been seen before for this.  Reports naproxen did help

## 2021-09-23 NOTE — Progress Notes (Signed)
Established Patient Office Visit  Subjective:  Patient ID: Edward Wilson, male    DOB: 1960/12/26  Age: 60 y.o. MRN: [id number]  CC:  Chief Complaint  Patient presents with   Hypertension    HPI Edward Wilson presents for primary care follow-up for hypertension.  On arrival blood pressure elevated 164/73 and he is not been taking his blood pressure medicine on a regular basis.  He did agree to and did receive a flu vaccine at this visit.  Patient has no other real complaints at this time.  He is trying to follow his diet more religiously at this time.  He has been taking the losartan but skipping the amlodipine.  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[place]) 02/06/2020   Anemia    Asthma    GERD (gastroesophageal reflux disease)    Hyperlipidemia    Hypertension     Past Surgical History:  Procedure Laterality Date   COLONOSCOPY  2016   POLYPECTOMY  2016    Family History  Problem Relation Age of Onset   Heart failure Mother        heart attack   Heart attack Mother    Hypertension Maternal Aunt    Heart Problems Maternal Aunt    Colon cancer Neg Hx    Stomach cancer Neg Hx    Esophageal cancer Neg Hx    Rectal cancer Neg Hx     Social History   Socioeconomic History   Marital status: Single    Spouse name: Not on file   Number of children: Not on file   Years of education: Not on file   Highest education level: Not on file  Occupational History   Not on file  Tobacco Use   Smoking status: Some Days    Packs/day: 0.10    Types: Cigarettes    Last attempt to quit: 01/25/2017    Years since quitting: 4.6   Smokeless tobacco: Never   Tobacco comments:    0.5 pack a week 02/06/20/ER  Vaping Use   Vaping Use: Never  used  Substance and Sexual Activity   Alcohol use: Yes    Comment: 12 pack of beer per week   Drug use: No   Sexual activity: Not on file  Other Topics Concern   Not on file  Social History Narrative   Not on file   Social Determinants of Health   Financial Resource Strain: Not on file  Food Insecurity: Not on file  Transportation Needs: Not on file  Physical Activity: Not on file  Stress: Not on file  Social Connections: Not on file  Intimate Partner Violence: Not on file    Outpatient Medications Prior to Visit  Medication Sig Dispense Refill   albuterol (VENTOLIN HFA) 108 (90 Base) MCG/ACT inhaler Inhale 2 puffs into the lungs every 6 (six) hours as needed for wheezing or shortness of breath. 18 g 5   clobetasol cream (TEMOVATE) 9.02 % Apply 1 application topically 2 (two) times daily. 30 g 1   losartan (COZAAR) 100 MG tablet Take 1 tablet (100 mg total) by mouth daily. 90 tablet 3   naproxen (NAPROSYN) 500 MG tablet Take 1 tablet (500 mg total) by mouth 2 (two) times daily.  30 tablet 0   amLODipine (NORVASC) 10 MG tablet Take 1 tablet (10 mg total) by mouth daily. 90 tablet 2   montelukast (SINGULAIR) 10 MG tablet Take 1 tablet (10 mg total) by mouth at bedtime. (Patient not taking: Reported on 09/24/2021) 30 tablet 11   No facility-administered medications prior to visit.    Allergies  Allergen Reactions   Shellfish Allergy Anaphylaxis and Hives    Reports causes hives and feeling of throat closing, difficulty breathing    ROS Review of Systems  Constitutional:  Negative for chills, diaphoresis and fever.  HENT:  Negative for congestion, hearing loss, nosebleeds, sore throat and tinnitus.   Eyes:  Negative for photophobia and redness.  Respiratory:  Negative for cough, shortness of breath, wheezing and stridor.   Cardiovascular:  Negative for chest pain, palpitations and leg swelling.  Gastrointestinal:  Negative for abdominal pain, blood in stool, constipation,  diarrhea, nausea and vomiting.  Endocrine: Negative for polydipsia.  Genitourinary:  Negative for dysuria, flank pain, frequency, hematuria and urgency.  Musculoskeletal:  Negative for back pain, myalgias and neck pain.  Skin:  Positive for rash.  Allergic/Immunologic: Negative for environmental allergies.  Neurological:  Negative for dizziness, tremors, seizures, weakness and headaches.  Hematological:  Does not bruise/bleed easily.  Psychiatric/Behavioral:  Negative for suicidal ideas. The patient is not nervous/anxious.      Objective:    Physical Exam Vitals reviewed.  Constitutional:      Appearance: Normal appearance. He is well-developed. He is obese. He is not diaphoretic.  HENT:     Head: Normocephalic and atraumatic.     Nose: No nasal deformity, septal deviation, mucosal edema or rhinorrhea.     Right Sinus: No maxillary sinus tenderness or frontal sinus tenderness.     Left Sinus: No maxillary sinus tenderness or frontal sinus tenderness.     Mouth/Throat:     Pharynx: No oropharyngeal exudate.  Eyes:     General: No scleral icterus.    Conjunctiva/sclera: Conjunctivae normal.     Pupils: Pupils are equal, round, and reactive to light.  Neck:     Thyroid: No thyromegaly.     Vascular: No carotid bruit or JVD.     Trachea: Trachea normal. No tracheal tenderness or tracheal deviation.  Cardiovascular:     Rate and Rhythm: Normal rate and regular rhythm.     Chest Wall: PMI is not displaced.     Pulses: Normal pulses. No decreased pulses.     Heart sounds: Normal heart sounds, S1 normal and S2 normal. Heart sounds not distant. No murmur heard. No systolic murmur is present.  No diastolic murmur is present.    No friction rub. No gallop. No S3 or S4 sounds.  Pulmonary:     Effort: Pulmonary effort is normal. No tachypnea, accessory muscle usage or respiratory distress.     Breath sounds: Normal breath sounds. No stridor. No decreased breath sounds, wheezing, rhonchi  or rales.  Chest:     Chest wall: No tenderness.  Abdominal:     General: Bowel sounds are normal. There is no distension.     Palpations: Abdomen is soft. Abdomen is not rigid.     Tenderness: There is no abdominal tenderness. There is no guarding or rebound.  Musculoskeletal:        General: Normal range of motion.     Cervical back: Normal range of motion and neck supple. No edema, erythema or rigidity. No muscular tenderness. Normal range of motion.  Lymphadenopathy:     Head:     Right side of head: No submental or submandibular adenopathy.     Left side of head: No submental or submandibular adenopathy.     Cervical: No cervical adenopathy.  Skin:    General: Skin is warm and dry.     Coloration: Skin is not pale.     Findings: Rash present.     Nails: There is no clubbing.     Comments: Psoriatic rash over both hands  Neurological:     Mental Status: He is alert and oriented to person, place, and time.     Sensory: No sensory deficit.  Psychiatric:        Mood and Affect: Mood normal.        Speech: Speech normal.        Behavior: Behavior normal.        Thought Content: Thought content normal.    BP (!) 164/73    Pulse 68    Resp 16    Wt 282 lb 3.2 oz (128 kg)    SpO2 97%    BMI 33.46 kg/m  Wt Readings from Last 3 Encounters:  09/24/21 282 lb 3.2 oz (128 kg)  05/26/21 276 lb 6.4 oz (125.4 kg)  01/30/21 285 lb (129.3 kg)     Health Maintenance Due  Topic Date Due   Zoster Vaccines- Shingrix (1 of 2) Never done    There are no preventive care reminders to display for this patient.  No results found for: TSH Lab Results  Component Value Date   WBC 9.0 12/25/2019   HGB 12.6 (L) 12/25/2019   HCT 40.5 12/25/2019   MCV 91.4 12/25/2019   PLT 308 12/25/2019   Lab Results  Component Value Date   NA 143 01/30/2021   K 4.5 01/30/2021   CO2 25 01/30/2021   GLUCOSE 83 01/30/2021   BUN 11 01/30/2021   CREATININE 0.94 01/30/2021   BILITOT 0.3 01/30/2021    ALKPHOS 99 01/30/2021   AST 14 01/30/2021   ALT 11 01/30/2021   PROT 7.6 01/30/2021   ALBUMIN 4.4 01/30/2021   CALCIUM 9.8 01/30/2021   ANIONGAP 7 12/25/2019   EGFR 92 01/30/2021   Lab Results  Component Value Date   CHOL 195 01/30/2021   Lab Results  Component Value Date   HDL 73 01/30/2021   Lab Results  Component Value Date   LDLCALC 106 (H) 01/30/2021   Lab Results  Component Value Date   TRIG 90 01/30/2021   Lab Results  Component Value Date   CHOLHDL 2.7 01/30/2021   Lab Results  Component Value Date   HGBA1C 6.0 (H) 01/30/2021      Assessment & Plan:   Problem List Items Addressed This Visit       Cardiovascular and Mediastinum   Essential hypertension - Primary    Blood pressure not well controlled plan is to resume amlodipine 10 mg daily and resume losartan 100 mg daily  Patient will follow-up with clinical pharmacy and Dr. Joya Gaskins as well      Relevant Medications   amLODipine (NORVASC) 10 MG tablet   losartan (COZAAR) 100 MG tablet     Respiratory   Asthma, chronic (Chronic)    Stable asthma no changes        Musculoskeletal and Integument   Psoriasis-like skin disease    Refill on steroid cream given and patient is to keep his upcoming dermatology appointment  Other   Tobacco use disorder (Chronic)    Patient is down to 1 to 2 cigarettes every other week and he is not going to quit after the new year      Pure hypercholesterolemia (Chronic)    Patient treating his increased cholesterol with diet alone      Relevant Medications   amLODipine (NORVASC) 10 MG tablet   losartan (COZAAR) 100 MG tablet   RESOLVED: Alcohol use    Not drinking currently       Meds ordered this encounter  Medications   amLODipine (NORVASC) 10 MG tablet    Sig: Take 1 tablet (10 mg total) by mouth daily.    Dispense:  90 tablet    Refill:  2    To lower blood pressure   clobetasol cream (TEMOVATE) 0.05 %    Sig: Apply 1 application  topically 2 (two) times daily.    Dispense:  30 g    Refill:  1   losartan (COZAAR) 100 MG tablet    Sig: Take 1 tablet (100 mg total) by mouth daily.    Dispense:  90 tablet    Refill:  3    Follow-up: Return in about 2 months (around 11/25/2021).    Asencion Noble, MD

## 2021-09-24 ENCOUNTER — Ambulatory Visit: Payer: BC Managed Care – PPO | Attending: Critical Care Medicine | Admitting: Critical Care Medicine

## 2021-09-24 ENCOUNTER — Encounter: Payer: Self-pay | Admitting: Critical Care Medicine

## 2021-09-24 ENCOUNTER — Other Ambulatory Visit: Payer: Self-pay

## 2021-09-24 VITALS — BP 164/73 | HR 68 | Resp 16 | Wt 282.2 lb

## 2021-09-24 DIAGNOSIS — J4540 Moderate persistent asthma, uncomplicated: Secondary | ICD-10-CM

## 2021-09-24 DIAGNOSIS — L989 Disorder of the skin and subcutaneous tissue, unspecified: Secondary | ICD-10-CM

## 2021-09-24 DIAGNOSIS — Z789 Other specified health status: Secondary | ICD-10-CM

## 2021-09-24 DIAGNOSIS — I10 Essential (primary) hypertension: Secondary | ICD-10-CM

## 2021-09-24 DIAGNOSIS — E7800 Pure hypercholesterolemia, unspecified: Secondary | ICD-10-CM

## 2021-09-24 DIAGNOSIS — F17200 Nicotine dependence, unspecified, uncomplicated: Secondary | ICD-10-CM | POA: Diagnosis not present

## 2021-09-24 MED ORDER — AMLODIPINE BESYLATE 10 MG PO TABS: 10.0000 mg | ORAL_TABLET | Freq: Every day | ORAL | 2 refills | Status: DC

## 2021-09-24 MED ORDER — LOSARTAN POTASSIUM 100 MG PO TABS: 100.0000 mg | ORAL_TABLET | Freq: Every day | ORAL | 3 refills | Status: DC

## 2021-09-24 MED ORDER — CLOBETASOL PROPIONATE 0.05 % EX CREA: 1.0000 "application " | TOPICAL_CREAM | Freq: Two times a day (BID) | CUTANEOUS | 1 refills | Status: DC

## 2021-09-24 NOTE — Assessment & Plan Note (Signed)
Blood pressure not well controlled plan is to resume amlodipine 10 mg daily and resume losartan 100 mg daily  Patient will follow-up with clinical pharmacy and Dr. Joya Gaskins as well

## 2021-09-24 NOTE — Assessment & Plan Note (Signed)
Patient treating his increased cholesterol with diet alone 

## 2021-09-24 NOTE — Patient Instructions (Signed)
Refills on all medications sent to your [facility]  We discussed getting a good skin moisturizer like Aveeno, cerave or Eucerin and use that with the steroid cream which we refilled to moisturize your skin  Follow healthy diet as we discussed and exercise 30 minutes 3-4 times a week with walking, joining a gym is a good idea  We discussed again to give up the last cigarette you are smoking every 2 weeks  A nurse appointment end of January be made for your first shingles vaccine  Return to see Dr. Joya Gaskins in 2 months to follow-up your blood pressure  Consider buying a blood pressure meter at [facility] and keep track your blood pressure at home

## 2021-09-24 NOTE — Assessment & Plan Note (Signed)
Stable asthma no changes

## 2021-09-24 NOTE — Assessment & Plan Note (Signed)
Not drinking currently

## 2021-09-24 NOTE — Assessment & Plan Note (Signed)
Refill on steroid cream given and patient is to keep his upcoming dermatology appointment

## 2021-09-24 NOTE — Assessment & Plan Note (Signed)
Patient is down to 1 to 2 cigarettes every other week and he is not going to quit after the new year

## 2021-10-09 ENCOUNTER — Telehealth: Payer: Self-pay | Admitting: Critical Care Medicine

## 2021-10-09 NOTE — Telephone Encounter (Signed)
Copied from [place] [id number]. Topic: Quick Communication - Rx Refill/Question >> Oct 08, 2021 11:42 AM Pawlus, [name] wrote: Pt stated he lost his "blood pressure meds" and wanted to know if they can be refilled early. Pt did not specify the names of the meds.

## 2021-10-09 NOTE — Telephone Encounter (Signed)
Called pharmacy and pt insurance does not cover it I will talk to [facility] about another alternative to this insulin.

## 2021-10-31 ENCOUNTER — Ambulatory Visit: Payer: BC Managed Care – PPO

## 2021-11-07 ENCOUNTER — Other Ambulatory Visit: Payer: Self-pay

## 2021-11-07 ENCOUNTER — Ambulatory Visit: Payer: No Typology Code available for payment source | Attending: Critical Care Medicine

## 2021-11-07 DIAGNOSIS — Z23 Encounter for immunization: Secondary | ICD-10-CM

## 2021-11-24 NOTE — Progress Notes (Incomplete)
Established Patient Office Visit  Subjective:  Patient ID: Edward Wilson, male    DOB: 09/18/1961  Age: 61 y.o. MRN: [id number]  CC: No chief complaint on file.   HPI Edward Wilson presents for   Blood pressure not well controlled plan is to resume amlodipine 10 mg daily and resume losartan 100 mg daily   Patient will follow-up with clinical pharmacy and Dr. Joya Gaskins as well         Relevant Medications    amLODipine (NORVASC) 10 MG tablet    losartan (COZAAR) 100 MG tablet        Respiratory    Asthma, chronic (Chronic)      Stable asthma no changes            Musculoskeletal and Integument    Psoriasis-like skin disease      Refill on steroid cream given and patient is to keep his upcoming dermatology appointment            Other    Tobacco use disorder (Chronic)      Patient is down to 1 to 2 cigarettes every other week and he is not going to quit after the new year        Pure hypercholesterolemia (Chronic)      Patient treating his increased cholesterol with diet alone        Relevant Medications    amLODipine (NORVASC) 10 MG tablet    losartan (COZAAR) 100 MG tablet    RESOLVED: Alcohol use      Not drinking currently       Past Medical History:  Diagnosis Date   Alcohol use 02/12/2015   Currently drinking 3 or more 40 ounce beers weekly.   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[place]) 02/06/2020   Anemia    Asthma    GERD (gastroesophageal reflux disease)    Hyperlipidemia    Hypertension     Past Surgical History:  Procedure Laterality Date   COLONOSCOPY  2016   POLYPECTOMY  2016    Family History  Problem Relation Age of Onset   Heart failure Mother        heart attack   Heart attack Mother    Hypertension Maternal Aunt    Heart Problems Maternal Aunt    Colon cancer Neg Hx    Stomach cancer Neg Hx    Esophageal cancer Neg Hx    Rectal cancer Neg Hx     Social History   Socioeconomic  History   Marital status: Single    Spouse name: Not on file   Number of children: Not on file   Years of education: Not on file   Highest education level: Not on file  Occupational History   Not on file  Tobacco Use   Smoking status: Some Days    Packs/day: 0.10    Types: Cigarettes    Last attempt to quit: 01/25/2017    Years since quitting: 4.8   Smokeless tobacco: Never   Tobacco comments:    0.5 pack a week 02/06/20/ER  Vaping Use   Vaping Use: Never used  Substance and Sexual Activity   Alcohol use: Yes    Comment: 12 pack of beer per week   Drug use: No   Sexual activity: Not on file  Other Topics Concern   Not on file  Social History Narrative   Not on file   Social Determinants of Health   Financial Resource Strain: Not on  file  Food Insecurity: Not on file  Transportation Needs: Not on file  Physical Activity: Not on file  Stress: Not on file  Social Connections: Not on file  Intimate Partner Violence: Not on file    Outpatient Medications Prior to Visit  Medication Sig Dispense Refill   albuterol (VENTOLIN HFA) 108 (90 Base) MCG/ACT inhaler Inhale 2 puffs into the lungs every 6 (six) hours as needed for wheezing or shortness of breath. 18 g 5   amLODipine (NORVASC) 10 MG tablet Take 1 tablet (10 mg total) by mouth daily. 90 tablet 2   clobetasol cream (TEMOVATE) 2.12 % Apply 1 application topically 2 (two) times daily. 30 g 1   losartan (COZAAR) 100 MG tablet Take 1 tablet (100 mg total) by mouth daily. 90 tablet 3   No facility-administered medications prior to visit.    Allergies  Allergen Reactions   Shellfish Allergy Anaphylaxis and Hives    Reports causes hives and feeling of throat closing, difficulty breathing    ROS Review of Systems    Objective:    Physical Exam  There were no vitals taken for this visit. Wt Readings from Last 3 Encounters:  09/24/21 282 lb 3.2 oz (128 kg)  05/26/21 276 lb 6.4 oz (125.4 kg)   01/30/21 285 lb (129.3 kg)     There are no preventive care reminders to display for this patient.  There are no preventive care reminders to display for this patient.  No results found for: TSH Lab Results  Component Value Date   WBC 9.0 12/25/2019   HGB 12.6 (L) 12/25/2019   HCT 40.5 12/25/2019   MCV 91.4 12/25/2019   PLT 308 12/25/2019   Lab Results  Component Value Date   NA 143 01/30/2021   K 4.5 01/30/2021   CO2 25 01/30/2021   GLUCOSE 83 01/30/2021   BUN 11 01/30/2021   CREATININE 0.94 01/30/2021   BILITOT 0.3 01/30/2021   ALKPHOS 99 01/30/2021   AST 14 01/30/2021   ALT 11 01/30/2021   PROT 7.6 01/30/2021   ALBUMIN 4.4 01/30/2021   CALCIUM 9.8 01/30/2021   ANIONGAP 7 12/25/2019   EGFR 92 01/30/2021   Lab Results  Component Value Date   CHOL 195 01/30/2021   Lab Results  Component Value Date   HDL 73 01/30/2021   Lab Results  Component Value Date   LDLCALC 106 (H) 01/30/2021   Lab Results  Component Value Date   TRIG 90 01/30/2021   Lab Results  Component Value Date   CHOLHDL 2.7 01/30/2021   Lab Results  Component Value Date   HGBA1C 6.0 (H) 01/30/2021      Assessment & Plan:   Problem List Items Addressed This Visit   None   No orders of the defined types were placed in this encounter.   Follow-up: No follow-ups on file.    Asencion Noble, MD

## 2021-11-25 ENCOUNTER — Ambulatory Visit: Payer: BC Managed Care – PPO | Admitting: Critical Care Medicine

## 2022-02-01 ENCOUNTER — Ambulatory Visit (HOSPITAL_COMMUNITY)
Admission: EM | Admit: 2022-02-01 | Discharge: 2022-02-01 | Disposition: A | Discharge status: Home / Self Care | Payer: No Typology Code available for payment source | Attending: Family Medicine | Admitting: Family Medicine

## 2022-02-01 ENCOUNTER — Ambulatory Visit (INDEPENDENT_AMBULATORY_CARE_PROVIDER_SITE_OTHER): Payer: No Typology Code available for payment source

## 2022-02-01 ENCOUNTER — Encounter (HOSPITAL_COMMUNITY): Payer: Self-pay | Admitting: Emergency Medicine

## 2022-02-01 DIAGNOSIS — M7121 Synovial cyst of popliteal space [Baker], right knee: Secondary | ICD-10-CM

## 2022-02-01 DIAGNOSIS — M25561 Pain in right knee: Secondary | ICD-10-CM

## 2022-02-01 MED ORDER — NAPROXEN 500 MG PO TABS: 500.0000 mg | ORAL_TABLET | Freq: Two times a day (BID) | ORAL | 0 refills | Status: DC

## 2022-02-01 NOTE — Discharge Instructions (Addendum)
You were seen today for pain behind the right knee.  ?Your xrays showed arthritis, but nothing more.  ?I believe you may have a Bakers Cyst.  I have given you information on this today.  ?For this, you should take naprosyn for pain.  I have sent this to your pharmacy.  Please stay off of your leg if possible.   ?If you continue with pain, then please follow up with your primary care provider for further discussion and to possibly see an orthopedist.  ?

## 2022-02-01 NOTE — ED Provider Notes (Signed)
?[facility] ? ? ? ?CSN: [id number] ?Arrival date & time: 02/01/22  1039 ? ? ?  ? ?History   ?Chief Complaint ?Chief Complaint  ?Patient presents with  ? Knee Pain  ? ? ?HPI ?Edward Wilson is a 61 y.o. male.  ? ?Patient is here for pain behind the right knee for 1 week.  No obvious swelling.  It seems to hurt more/is more stiff if the knee is bent.   He has taken motrin, other pain reliever without much help.  He did have naprosyn as well without much help.  ?No known injury.  Just started out of the blue.  ? ?Past Medical History:  ?Diagnosis Date  ? Alcohol use 02/12/2015  ? Currently drinking 3 or more 40 ounce beers weekly.  ? 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[place]) 02/06/2020  ? Anemia   ? Asthma   ? GERD (gastroesophageal reflux disease)   ? Hyperlipidemia   ? Hypertension   ? ? ?Patient Active Problem List  ? Diagnosis Date Noted  ? Prediabetes 05/26/2021  ? Obesity (BMI 30.0-34.9) 05/26/2021  ? Psoriasis-like skin disease 06/27/2020  ? Pure hypercholesterolemia 09/18/2016  ? Asthma, chronic 08/07/2016  ? History of syphilis 10/14/2015  ? Essential hypertension 02/12/2015  ? Arthralgia 02/12/2015  ? Tobacco use disorder 02/12/2015  ? ? ?Past Surgical History:  ?Procedure Laterality Date  ? COLONOSCOPY  2016  ? POLYPECTOMY  2016  ? ? ? ? ? ?Home Medications   ? ?Prior to Admission medications   ?Medication Sig Start Date End Date Taking? Authorizing Provider  ?albuterol (VENTOLIN HFA) 108 (90 Base) MCG/ACT inhaler Inhale 2 puffs into the lungs every 6 (six) hours as needed for wheezing or shortness of breath. 05/26/21   Elsie Stain, MD  ?amLODipine (NORVASC) 10 MG tablet Take 1 tablet (10 mg total) by mouth daily. 09/24/21 12/23/21  Elsie Stain, MD  ?clobetasol cream (TEMOVATE) 8.18 % Apply 1 application topically 2 (two) times daily. 09/24/21   Elsie Stain, MD  ?losartan (COZAAR) 100 MG tablet Take 1 tablet (100 mg total) by mouth daily. 09/24/21   Elsie Stain, MD  ? ? ?Family History ?Family History  ?Problem Relation Age of Onset  ? Heart failure Mother   ?     heart attack  ? Heart attack Mother   ? Hypertension Maternal Aunt   ? Heart Problems Maternal Aunt   ? Colon cancer Neg Hx   ? Stomach cancer Neg Hx   ? Esophageal cancer Neg Hx   ? Rectal cancer Neg Hx   ? ? ?Social History ?Social History  ? ?Tobacco Use  ? Smoking status: Some Days  ?  Packs/day: 0.10  ?  Types: Cigarettes  ?  Last attempt to quit: 01/25/2017  ?  Years since quitting: 5.0  ? Smokeless tobacco: Never  ? Tobacco comments:  ?  0.5 pack a week 02/06/20/ER  ?Vaping Use  ? Vaping Use: Never used  ?Substance Use Topics  ? Alcohol use: Yes  ?  Comment: 12 pack of beer per week  ? Drug use: No  ? ? ? ?Allergies   ?Shellfish allergy ? ? ?Review of Systems ?Review of Systems  ?Constitutional: Negative.   ?HENT: Negative.    ?Respiratory: Negative.    ?Cardiovascular: Negative.   ?Gastrointestinal: Negative.   ?Genitourinary: Negative.   ?Musculoskeletal:  Positive for gait problem.  ? ? ?Physical Exam ?Triage Vital Signs ?ED Triage Vitals  ?  Enc Vitals Group  ?   BP 02/01/22 1249 (!) 166/81  ?   Pulse Rate 02/01/22 1249 (!) 52  ?   Resp 02/01/22 1249 16  ?   Temp 02/01/22 1249 98.3 ?F (36.8 ?C)  ?   Temp Source 02/01/22 1249 Oral  ?   SpO2 02/01/22 1249 95 %  ?   Weight 02/01/22 1248 282 lb 3 oz (128 kg)  ?   Height 02/01/22 1248 '6\' 5"'$  (1.956 m)  ?   Head Circumference --   ?   Peak Flow --   ?   Pain Score 02/01/22 1248 9  ?   Pain Loc --   ?   Pain Edu? --   ?   Excl. in [place]? --   ? ?No data found. ? ?Updated Vital Signs ?BP (!) 166/81 (BP Location: Right Arm)   Pulse (!) 52   Temp 98.3 ?F (36.8 ?C) (Oral)   Resp 16   Ht '6\' 5"'$  (1.956 m)   Wt 128 kg   SpO2 95%   BMI 33.46 kg/m?  ? ?Visual Acuity ?Right Eye Distance:   ?Left Eye Distance:   ?Bilateral Distance:   ? ?Right Eye Near:   ?Left Eye Near:    ?Bilateral Near:    ? ?Physical Exam ?Constitutional:   ?   Appearance: Normal appearance.   ?Musculoskeletal:  ?   Comments: He keeps the right knee extended due to pain.  ?No TTP to the anterior knee, lateral/medial knee;  ?Very slight discomfort to palpation to the posterior right knee;  full rom;  no obvious swelling or fullness to the posterior right knee.   ?Skin: ?   General: Skin is warm.  ?Neurological:  ?   General: No focal deficit present.  ?   Mental Status: He is alert and oriented to person, place, and time.  ?Psychiatric:     ?   Mood and Affect: Mood normal.     ?   Behavior: Behavior normal.  ? ? ? ?UC Treatments / Results  ?Labs ?(all labs ordered are listed, but only abnormal results are displayed) ?Labs Reviewed - No data to display ? ?EKG ? ? ?Radiology ?DG Knee Complete 4 Views Right ? ?Result Date: 02/01/2022 ?CLINICAL DATA:  Acute LEFT knee pain for 1 week. No known injury. Initial encounter. EXAM: RIGHT KNEE - COMPLETE 4+ VIEW COMPARISON:  None. FINDINGS: No acute fracture or dislocation identified. Moderate degenerative changes in the patellofemoral compartment noted. There is no evidence of joint effusion. No focal bony lesions are identified. IMPRESSION: 1. No evidence of acute abnormality. 2. Moderate patellofemoral compartment degenerative changes. Electronically Signed   By: Margarette Canada M.D.   On: 02/01/2022 13:46   ? ?Procedures ?Procedures (including critical care time) ? ?Medications Ordered in UC ?Medications - No data to display ? ?Initial Impression / Assessment and Plan / UC Course  ?I have reviewed the triage vital signs and the nursing notes. ? ?Pertinent labs & imaging results that were available during my care of the patient were reviewed by me and considered in my medical decision making (see chart for details). ? ?  ?Final Clinical Impressions(s) / UC Diagnoses  ? ?Final diagnoses:  ?Acute pain of right knee  ?Baker cyst, right  ? ? ? ?Discharge Instructions   ? ?  ?You were seen today for pain behind the right knee.  ?Your xrays showed arthritis, but nothing  more.  ?I believe you may have  a Bakers Cyst.  I have given you information on this today.  ?For this, you should take naprosyn for pain.  I have sent this to your pharmacy.  Please stay off of your leg if possible.   ?If you continue with pain, then please follow up with your primary care provider for further discussion and to possibly see an orthopedist.  ? ? ? ?ED Prescriptions   ? ? Medication Sig Dispense Auth. Provider  ? naproxen (NAPROSYN) 500 MG tablet Take 1 tablet (500 mg total) by mouth 2 (two) times daily. 30 tablet Rondel Oh, MD  ? ?  ? ?PDMP not reviewed this encounter. ?  ?Rondel Oh, MD ?02/01/22 1400 ? ?

## 2022-02-01 NOTE — ED Triage Notes (Signed)
Pt reports increased pain and stiffness behind the left knee for 1 week. States have been taking pain medication with no relief.  ?

## 2022-02-10 ENCOUNTER — Telehealth: Payer: Self-pay | Admitting: Critical Care Medicine

## 2022-02-10 NOTE — Telephone Encounter (Signed)
Patient was called and given a nurse visit appointment for his 2nd shingles vaccine. ?

## 2022-02-10 NOTE — Telephone Encounter (Signed)
Copied from [place]. Topic: General - Other ?>> Feb 10, 2022  2:22 PM Leward Quan A wrote: ?Reason for CRM: Patient request a call back from Dr Joya Gaskins nurse to find out how soon he can get in for his second shingles vaccine or if he need to start over. First vaccine was 11/07/21 please call patient at  Ph# [phone] ?

## 2022-02-11 ENCOUNTER — Ambulatory Visit: Payer: No Typology Code available for payment source | Attending: Critical Care Medicine

## 2022-02-11 DIAGNOSIS — Z23 Encounter for immunization: Secondary | ICD-10-CM | POA: Diagnosis not present

## 2022-04-14 ENCOUNTER — Encounter: Payer: Self-pay | Admitting: Critical Care Medicine

## 2022-04-14 ENCOUNTER — Ambulatory Visit
Payer: No Typology Code available for payment source | Attending: Critical Care Medicine | Admitting: Critical Care Medicine

## 2022-04-14 VITALS — BP 146/76 | HR 66 | Wt 276.4 lb

## 2022-04-14 DIAGNOSIS — I10 Essential (primary) hypertension: Secondary | ICD-10-CM

## 2022-04-14 DIAGNOSIS — L989 Disorder of the skin and subcutaneous tissue, unspecified: Secondary | ICD-10-CM | POA: Diagnosis not present

## 2022-04-14 DIAGNOSIS — J4540 Moderate persistent asthma, uncomplicated: Secondary | ICD-10-CM

## 2022-04-14 DIAGNOSIS — E669 Obesity, unspecified: Secondary | ICD-10-CM

## 2022-04-14 DIAGNOSIS — R7303 Prediabetes: Secondary | ICD-10-CM

## 2022-04-14 DIAGNOSIS — F17200 Nicotine dependence, unspecified, uncomplicated: Secondary | ICD-10-CM

## 2022-04-14 DIAGNOSIS — E7800 Pure hypercholesterolemia, unspecified: Secondary | ICD-10-CM

## 2022-04-14 DIAGNOSIS — M25562 Pain in left knee: Secondary | ICD-10-CM

## 2022-04-14 MED ORDER — NAPROXEN 500 MG PO TABS: 500.0000 mg | ORAL_TABLET | Freq: Two times a day (BID) | ORAL | 1 refills | Status: DC | PRN

## 2022-04-14 MED ORDER — MONTELUKAST SODIUM 10 MG PO TABS: 10.0000 mg | ORAL_TABLET | Freq: Every day | ORAL | 3 refills | Status: DC

## 2022-04-14 MED ORDER — CLOBETASOL PROPIONATE 0.05 % EX CREA: 1.0000 | TOPICAL_CREAM | Freq: Two times a day (BID) | CUTANEOUS | 1 refills | Status: DC

## 2022-04-14 MED ORDER — AMLODIPINE BESYLATE 10 MG PO TABS: 10.0000 mg | ORAL_TABLET | Freq: Every day | ORAL | 2 refills | Status: DC

## 2022-04-14 MED ORDER — LOSARTAN POTASSIUM-HCTZ 100-25 MG PO TABS: 1.0000 | ORAL_TABLET | Freq: Every day | ORAL | 2 refills | Status: DC

## 2022-04-14 MED ORDER — ALBUTEROL SULFATE HFA 108 (90 BASE) MCG/ACT IN AERS: 2.0000 | INHALATION_SPRAY | Freq: Four times a day (QID) | RESPIRATORY_TRACT | 5 refills | Status: DC | PRN

## 2022-04-14 NOTE — Assessment & Plan Note (Addendum)
  .   Current smoking consumption amount: 4 cigarettes a week  . Dicsussion on advise to quit smoking and smoking impacts: Cardiovascular impacts lung impacts  . Patient's willingness to quit: Wants to quit  . Methods to quit smoking discussed: Every modification nicotine replacement  . Medication management of smoking session drugs discussed: Nicotine replacement  . Resources provided:  AVS   . Setting quit date not established  . Follow-up arranged 4 weeks   Time spent counseling the patient: 5 minutes

## 2022-04-14 NOTE — Assessment & Plan Note (Signed)
The following Lifestyle Medicine recommendations according to [facility]) were discussed and offered to patient who agrees to start the journey:  A. Whole Foods, Plant-based plate comprising of fruits and vegetables, plant-based proteins, whole-grain carbohydrates was discussed in detail with the patient.   A list for source of those nutrients were also provided to the patient.  Patient will use only water or unsweetened tea for hydration. B.  The need to stay away from risky substances including alcohol, smoking; obtaining 7 to 9 hours of restorative sleep, at least 150 minutes of moderate intensity exercise weekly, the importance of healthy social connections,  and stress reduction techniques were discussed.   Reassess A1c

## 2022-04-14 NOTE — Assessment & Plan Note (Signed)
As per hypertension prediabetes assessments

## 2022-04-14 NOTE — Assessment & Plan Note (Signed)
Pain is worse in the left knee had a Baker's cyst evaluated recently I gave patient knee exercises and refills on Naprosyn

## 2022-04-14 NOTE — Assessment & Plan Note (Signed)
Reassess lipid panel The following Lifestyle Medicine recommendations according to [facility]) were discussed and offered to patient who agrees to start the journey:  A. Whole Foods, Plant-based plate comprising of fruits and vegetables, plant-based proteins, whole-grain carbohydrates was discussed in detail with the patient.   A list for source of those nutrients were also provided to the patient.  Patient will use only water or unsweetened tea for hydration. B.  The need to stay away from risky substances including alcohol, smoking; obtaining 7 to 9 hours of restorative sleep, at least 150 minutes of moderate intensity exercise weekly, the importance of healthy social connections,  and stress reduction techniques were discussed.

## 2022-04-14 NOTE — Progress Notes (Deleted)
Established Patient Office Visit  Subjective:  Patient ID: Edward Wilson, male    DOB: December 22, 1960  Age: 61 y.o. MRN: [id number]  CC:  No chief complaint on file.   HPI 09/2021 [name] presents for primary care follow-up for hypertension.  On arrival blood pressure elevated 164/73 and he is not been taking his blood pressure medicine on a regular basis.  He did agree to and did receive a flu vaccine at this visit.  Patient has no other real complaints at this time.  He is trying to follow his diet more religiously at this time.  He has been taking the losartan but skipping the amlodipine.  Patient still has issues with psoriasis on the hands and is got an appoint with dermatology upcoming  7/11  Essential hypertension - Primary    Blood pressure not well controlled plan is to resume amlodipine 10 mg daily and resume losartan 100 mg daily  Patient will follow-up with clinical pharmacy and Dr. Joya Gaskins as well      Relevant Medications   amLODipine (NORVASC) 10 MG tablet   losartan (COZAAR) 100 MG tablet     Respiratory   Asthma, chronic (Chronic)    Stable asthma no changes        Musculoskeletal and Integument   Psoriasis-like skin disease    Refill on steroid cream given and patient is to keep his upcoming dermatology appointment        Other   Tobacco use disorder (Chronic)    Patient is down to 1 to 2 cigarettes every other week and he is not going to quit after the new year      Pure hypercholesterolemia (Chronic)    Patient treating his increased cholesterol with diet alone      Relevant Medications   amLODipine (NORVASC) 10 MG tablet   losartan (COZAAR) 100 MG tablet   RESOLVED: Alcohol use    Not drinking currently      Past Medical History:  Diagnosis Date   Alcohol use 02/12/2015   Currently drinking 3 or more 40 ounce beers weekly.   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   Anemia    Asthma    GERD  (gastroesophageal reflux disease)    Hyperlipidemia    Hypertension     Past Surgical History:  Procedure Laterality Date   COLONOSCOPY  2016   POLYPECTOMY  2016    Family History  Problem Relation Age of Onset   Heart failure Mother        heart attack   Heart attack Mother    Hypertension Maternal Aunt    Heart Problems Maternal Aunt    Colon cancer Neg Hx    Stomach cancer Neg Hx    Esophageal cancer Neg Hx    Rectal cancer Neg Hx     Social History   Socioeconomic History   Marital status: Single    Spouse name: Not on file   Number of children: Not on file   Years of education: Not on file   Highest education level: Not on file  Occupational History   Not on file  Tobacco Use   Smoking status: Some Days    Packs/day: 0.10    Types: Cigarettes    Last attempt to quit: 01/25/2017    Years since quitting: 5.2   Smokeless tobacco: Never   Tobacco comments:    0.5 pack a week 02/06/20/ER  Vaping Use   Vaping Use: Never  used  Substance and Sexual Activity   Alcohol use: Yes    Comment: 12 pack of beer per week   Drug use: No   Sexual activity: Not on file  Other Topics Concern   Not on file  Social History Narrative   Not on file   Social Determinants of Health   Financial Resource Strain: Not on file  Food Insecurity: Not on file  Transportation Needs: Not on file  Physical Activity: Not on file  Stress: Not on file  Social Connections: Not on file  Intimate Partner Violence: Not on file    Outpatient Medications Prior to Visit  Medication Sig Dispense Refill   albuterol (VENTOLIN HFA) 108 (90 Base) MCG/ACT inhaler Inhale 2 puffs into the lungs every 6 (six) hours as needed for wheezing or shortness of breath. 18 g 5   amLODipine (NORVASC) 10 MG tablet Take 1 tablet (10 mg total) by mouth daily. 90 tablet 2   clobetasol cream (TEMOVATE) 1.61 % Apply 1 application topically 2 (two) times daily. 30 g 1   losartan (COZAAR) 100 MG tablet Take 1 tablet  (100 mg total) by mouth daily. 90 tablet 3   naproxen (NAPROSYN) 500 MG tablet Take 1 tablet (500 mg total) by mouth 2 (two) times daily. 30 tablet 0   No facility-administered medications prior to visit.    Allergies  Allergen Reactions   Shellfish Allergy Anaphylaxis and Hives    Reports causes hives and feeling of throat closing, difficulty breathing    ROS Review of Systems  Constitutional:  Negative for chills, diaphoresis and fever.  HENT:  Negative for congestion, hearing loss, nosebleeds, sore throat and tinnitus.   Eyes:  Negative for photophobia and redness.  Respiratory:  Negative for cough, shortness of breath, wheezing and stridor.   Cardiovascular:  Negative for chest pain, palpitations and leg swelling.  Gastrointestinal:  Negative for abdominal pain, blood in stool, constipation, diarrhea, nausea and vomiting.  Endocrine: Negative for polydipsia.  Genitourinary:  Negative for dysuria, flank pain, frequency, hematuria and urgency.  Musculoskeletal:  Negative for back pain, myalgias and neck pain.  Skin:  Positive for rash.  Allergic/Immunologic: Negative for environmental allergies.  Neurological:  Negative for dizziness, tremors, seizures, weakness and headaches.  Hematological:  Does not bruise/bleed easily.  Psychiatric/Behavioral:  Negative for suicidal ideas. The patient is not nervous/anxious.       Objective:    Physical Exam Vitals reviewed.  Constitutional:      Appearance: Normal appearance. He is well-developed. He is obese. He is not diaphoretic.  HENT:     Head: Normocephalic and atraumatic.     Nose: No nasal deformity, septal deviation, mucosal edema or rhinorrhea.     Right Sinus: No maxillary sinus tenderness or frontal sinus tenderness.     Left Sinus: No maxillary sinus tenderness or frontal sinus tenderness.     Mouth/Throat:     Pharynx: No oropharyngeal exudate.  Eyes:     General: No scleral icterus.    Conjunctiva/sclera:  Conjunctivae normal.     Pupils: Pupils are equal, round, and reactive to light.  Neck:     Thyroid: No thyromegaly.     Vascular: No carotid bruit or JVD.     Trachea: Trachea normal. No tracheal tenderness or tracheal deviation.  Cardiovascular:     Rate and Rhythm: Normal rate and regular rhythm.     Chest Wall: PMI is not displaced.     Pulses: Normal pulses. No decreased  pulses.     Heart sounds: Normal heart sounds, S1 normal and S2 normal. Heart sounds not distant. No murmur heard.    No systolic murmur is present.     No diastolic murmur is present.     No friction rub. No gallop. No S3 or S4 sounds.  Pulmonary:     Effort: Pulmonary effort is normal. No tachypnea, accessory muscle usage or respiratory distress.     Breath sounds: Normal breath sounds. No stridor. No decreased breath sounds, wheezing, rhonchi or rales.  Chest:     Chest wall: No tenderness.  Abdominal:     General: Bowel sounds are normal. There is no distension.     Palpations: Abdomen is soft. Abdomen is not rigid.     Tenderness: There is no abdominal tenderness. There is no guarding or rebound.  Musculoskeletal:        General: Normal range of motion.     Cervical back: Normal range of motion and neck supple. No edema, erythema or rigidity. No muscular tenderness. Normal range of motion.  Lymphadenopathy:     Head:     Right side of head: No submental or submandibular adenopathy.     Left side of head: No submental or submandibular adenopathy.     Cervical: No cervical adenopathy.  Skin:    General: Skin is warm and dry.     Coloration: Skin is not pale.     Findings: Rash present.     Nails: There is no clubbing.     Comments: Psoriatic rash over both hands  Neurological:     Mental Status: He is alert and oriented to person, place, and time.     Sensory: No sensory deficit.  Psychiatric:        Mood and Affect: Mood normal.        Speech: Speech normal.        Behavior: Behavior normal.         Thought Content: Thought content normal.    There were no vitals taken for this visit. Wt Readings from Last 3 Encounters:  02/01/22 282 lb 3 oz (128 kg)  09/24/21 282 lb 3.2 oz (128 kg)  05/26/21 276 lb 6.4 oz (125.4 kg)     There are no preventive care reminders to display for this patient.   There are no preventive care reminders to display for this patient.  No results found for: "TSH" Lab Results  Component Value Date   WBC 9.0 12/25/2019   HGB 12.6 (L) 12/25/2019   HCT 40.5 12/25/2019   MCV 91.4 12/25/2019   PLT 308 12/25/2019   Lab Results  Component Value Date   NA 143 01/30/2021   K 4.5 01/30/2021   CO2 25 01/30/2021   GLUCOSE 83 01/30/2021   BUN 11 01/30/2021   CREATININE 0.94 01/30/2021   BILITOT 0.3 01/30/2021   ALKPHOS 99 01/30/2021   AST 14 01/30/2021   ALT 11 01/30/2021   PROT 7.6 01/30/2021   ALBUMIN 4.4 01/30/2021   CALCIUM 9.8 01/30/2021   ANIONGAP 7 12/25/2019   EGFR 92 01/30/2021   Lab Results  Component Value Date   CHOL 195 01/30/2021   Lab Results  Component Value Date   HDL 73 01/30/2021   Lab Results  Component Value Date   LDLCALC 106 (H) 01/30/2021   Lab Results  Component Value Date   TRIG 90 01/30/2021   Lab Results  Component Value Date   CHOLHDL 2.7 01/30/2021  Lab Results  Component Value Date   HGBA1C 6.0 (H) 01/30/2021      Assessment & Plan:   Problem List Items Addressed This Visit   None  No orders of the defined types were placed in this encounter.   Follow-up: No follow-ups on file.    Asencion Noble, MD

## 2022-04-14 NOTE — Patient Instructions (Addendum)
Start montelukast Singulair 1 pill daily for asthma and albuterol refill was given  Change losartan to losartan HCT 1 daily for blood pressure  Stay on amlodipine 1 pill daily  Refill on Temovate for psoriasis given mixed this with the skin moisturizer twice daily on the hands and arms  Perform the exercises as seeing attached, Naprosyn was refilled  Review lifestyle medicine handout we gave you at this visit  Return to see Dr. Joya Gaskins 1 month with complete physical and blood pressure recheck

## 2022-04-14 NOTE — Assessment & Plan Note (Signed)
Chronic asthma we will refill albuterol and Singulair

## 2022-04-14 NOTE — Assessment & Plan Note (Signed)
Blood pressure not at goal plan for this patient will be to change losartan to losartan HCT 100/25 daily we will continue amlodipine 10 mg daily and was given a lifestyle medicine approach  The following Lifestyle Medicine recommendations according to [place] [facility]) were discussed and offered to patient who agrees to start the journey:  A. Whole Foods, Plant-based plate comprising of fruits and vegetables, plant-based proteins, whole-grain carbohydrates was discussed in detail with the patient.   A list for source of those nutrients were also provided to the patient.  Patient will use only water or unsweetened tea for hydration. B.  The need to stay away from risky substances including alcohol, smoking; obtaining 7 to 9 hours of restorative sleep, at least 150 minutes of moderate intensity exercise weekly, the importance of healthy social connections,  and stress reduction techniques were discussed.  Patient returns short-term in 6 weeks

## 2022-04-14 NOTE — Assessment & Plan Note (Signed)
Patient prefers Temovate this works better we will represcribe this again and mix this with skin moisturization twice daily to the arms and hands

## 2022-04-14 NOTE — Progress Notes (Signed)
Established Patient Office Visit  Subjective   Patient ID: Edward Wilson, male    DOB: 24-Feb-1961  Age: 61 y.o. MRN: [id number]  Chief Complaint  Patient presents with   Medication Refill   Hypertension    Edward Wilson is a 61 year old male with history of  hypertension, asthma and psoriasis who returns today for a follow up. He reports he has been compliant with his blood pressure medications amlodipine and losartan. Not currently checking his pressures at home but is in the process of obtaining a machine. His asthma has been well controlled and reports he rarely will use his albuterol inhaler. His symptoms usually occur seasonally with allergies. He was previously given Singulair with relief and is requesting a refill. Patient also reports some skin dryness  and states that clobetasol cream we previously had prescribed seemed to help. Also reports some intermittent right sided lower back pain ongoing 1 month, that does not radiate and can not be reproduced. Naproxen brings relief. Recently was seen [facility]for a right Baker's cyst, treated with 4 consecutive weekly steroid injections, completed 2 weeks ago. Per patient, he has had relief in symptoms since.  He is still smoking cigarettes, reports 3-4 weekly with consumption of a beer.  He expresses some concern about diabetes and is interested in a full work up for it in the future. Patient denies increased thirst, increased urination, fatigue or acute visual changes. States his vision has been progressively declining for years and believes it is age related, plans to obtain glasses in the future.  BP today is 146/76. He did take his medications this morning.     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[place]) 02/06/2020   Anemia    Asthma    GERD (gastroesophageal reflux disease)    Hyperlipidemia    Hypertension    Past Surgical History:  Procedure Laterality Date   COLONOSCOPY  2016   POLYPECTOMY  2016   Social History   Tobacco Use   Smoking status: Some Days    Packs/day: 0.10    Types: Cigarettes    Last attempt to quit: 01/25/2017    Years since quitting: 5.2   Smokeless tobacco: Never   Tobacco comments:    0.5 pack a week 02/06/20/ER  Vaping Use   Vaping Use: Never used  Substance Use Topics   Alcohol use: Yes    Comment: 12 pack of beer per week   Drug use: No   Social History   Socioeconomic History   Marital status: Single    Spouse name: Not on file   Number of children: Not on file   Years of education: Not on file   Highest education level: Not on file  Occupational History   Not on file  Tobacco Use   Smoking status: Some Days    Packs/day: 0.10    Types: Cigarettes    Last attempt to quit: 01/25/2017    Years since quitting: 5.2   Smokeless tobacco: Never  Tobacco comments:    0.5 pack a week 02/06/20/ER  Vaping Use   Vaping Use: Never used  Substance and Sexual Activity   Alcohol use: Yes    Comment: 12 pack of beer per week   Drug use: No   Sexual activity: Not on file  Other Topics Concern   Not on file  Social History Narrative   Not on file   Social Determinants of Health   Financial Resource Strain: Not on file  Food Insecurity: Not on file  Transportation Needs: Not on file  Physical Activity: Not on file  Stress: Not on file  Social Connections: Not on file  Intimate Partner Violence: Not on file   Family Status  Relation Name Status   Mother  Deceased   Father   Deceased   Mat Aunt  Alive   Neg Hx  (Not Specified)   Family History  Problem Relation Age of Onset   Heart failure Mother        heart attack   Heart attack Mother    Hypertension Maternal Aunt    Heart Problems Maternal Aunt    Colon cancer Neg Hx    Stomach cancer Neg Hx    Esophageal cancer Neg Hx    Rectal cancer Neg Hx    Allergies  Allergen Reactions   Shellfish Allergy Anaphylaxis and Hives    Reports causes hives and feeling of throat closing, difficulty breathing      Review of Systems  Constitutional: Negative.   HENT: Negative.    Eyes: Negative.   Respiratory: Negative.    Cardiovascular: Negative.   Gastrointestinal: Negative.   Genitourinary: Negative.   Musculoskeletal:  Positive for back pain (right lower).  Skin:        Skin dryness, bilateral forearms  Neurological: Negative.   Endo/Heme/Allergies: Negative.   Psychiatric/Behavioral: Negative.        Objective:     BP (!) 146/76   Pulse 66   Wt 276 lb 6.4 oz (125.4 kg)   SpO2 94%   BMI 32.78 kg/m  BP Readings from Last 3 Encounters:  04/14/22 (!) 146/76  02/01/22 (!) 166/81  09/24/21 (!) 164/73   Wt Readings from Last 3 Encounters:  04/14/22 276 lb 6.4 oz (125.4 kg)  02/01/22 282 lb 3 oz (128 kg)  09/24/21 282 lb 3.2 oz (128 kg)      Physical Exam Constitutional:      Appearance: Normal appearance.  HENT:     Head: Normocephalic.     Mouth/Throat:     Mouth: Mucous membranes are moist.     Pharynx: Oropharynx is clear.  Eyes:     General: Lids are normal.     Conjunctiva/sclera: Conjunctivae normal.  Cardiovascular:     Rate and Rhythm: Normal rate and regular rhythm.     Heart sounds: Normal heart sounds.  Pulmonary:     Effort: Pulmonary effort is normal.     Breath sounds: Normal breath sounds.  Abdominal:     General: Bowel sounds are normal.     Palpations: Abdomen is soft.  Musculoskeletal:     Cervical back: Full passive range of motion without pain.   Skin:    Findings: No rash.     Comments: Dry raised patches bilateral forearms   Neurological:     Mental Status: He is alert and oriented to person, place, and time.  Psychiatric:        Mood and Affect: Mood normal.  Behavior: Behavior normal.      No results found for any visits on 04/14/22.  Last CBC Lab Results  Component Value Date   WBC 9.0 12/25/2019   HGB 12.6 (L) 12/25/2019   HCT 40.5 12/25/2019   MCV 91.4 12/25/2019   MCH 28.4 12/25/2019   RDW 13.1 12/25/2019   PLT 308 18/29/9371   Last metabolic panel Lab Results  Component Value Date   GLUCOSE 83 01/30/2021   NA 143 01/30/2021   K 4.5 01/30/2021   CL 105 01/30/2021   CO2 25 01/30/2021   BUN 11 01/30/2021   CREATININE 0.94 01/30/2021   EGFR 92 01/30/2021   CALCIUM 9.8 01/30/2021   PROT 7.6 01/30/2021   ALBUMIN 4.4 01/30/2021   LABGLOB 3.2 01/30/2021   AGRATIO 1.4 01/30/2021   BILITOT 0.3 01/30/2021   ALKPHOS 99 01/30/2021   AST 14 01/30/2021   ALT 11 01/30/2021   ANIONGAP 7 12/25/2019   Last lipids Lab Results  Component Value Date   CHOL 195 01/30/2021   HDL 73 01/30/2021   LDLCALC 106 (H) 01/30/2021   TRIG 90 01/30/2021   CHOLHDL 2.7 01/30/2021   Last hemoglobin A1c Lab Results  Component Value Date   HGBA1C 6.0 (H) 01/30/2021   Last thyroid functions No results found for: "TSH", "T3TOTAL", "T4TOTAL", "THYROIDAB" Last vitamin D No results found for: "25OHVITD2", "25OHVITD3", "VD25OH" Last vitamin B12 and Folate No results found for: "VITAMINB12", "FOLATE"    The 10-year ASCVD risk score (Arnett DK, et al., 2019) is: 27.4%    Assessment & Plan:   Problem List Items Addressed This Visit       Cardiovascular and Mediastinum   Essential hypertension    Blood pressure not at goal plan for this patient will be to change losartan to losartan HCT 100/25 daily we will continue amlodipine 10 mg daily and was given a lifestyle medicine approach  The following Lifestyle  Medicine recommendations according to [place] of Lifestyle Medicine [facility]) were discussed and offered to patient who agrees to start the journey:  A. Whole Foods, Plant-based plate comprising of fruits and vegetables, plant-based proteins, whole-grain carbohydrates was discussed in detail with the patient.   A list for source of those nutrients were also provided to the patient.  Patient will use only water or unsweetened tea for hydration. B.  The need to stay away from risky substances including alcohol, smoking; obtaining 7 to 9 hours of restorative sleep, at least 150 minutes of moderate intensity exercise weekly, the importance of healthy social connections,  and stress reduction techniques were discussed.  Patient returns short-term in 6 weeks       Relevant Medications   losartan-hydrochlorothiazide (HYZAAR) 100-25 MG tablet   amLODipine (NORVASC) 10 MG tablet   Other Relevant Orders   Comprehensive metabolic panel   CBC with Differential/Platelet     Respiratory   Asthma, chronic (Chronic)    Chronic asthma we will refill albuterol and Singulair      Relevant Medications   montelukast (SINGULAIR) 10 MG tablet   albuterol (VENTOLIN HFA) 108 (90 Base) MCG/ACT inhaler     Musculoskeletal and Integument   Psoriasis-like skin disease    Patient prefers Temovate this works better we will represcribe this again and mix this with skin moisturization twice daily to the arms and hands        Other   Tobacco use disorder (Chronic)       Current smoking consumption amount: 4 cigarettes a  week  Dicsussion on advise to quit smoking and smoking impacts: Cardiovascular impacts lung impacts  Patient's willingness to quit: Wants to quit  Methods to quit smoking discussed: Every modification nicotine replacement  Medication management of smoking session drugs discussed: Nicotine replacement  Resources provided:  AVS   Setting quit date not established  Follow-up  arranged 4 weeks   Time spent counseling the patient: 5 minutes       Pure hypercholesterolemia (Chronic)    Reassess lipid panel The following Lifestyle Medicine recommendations according to [place] of Lifestyle Medicine [facility]) were discussed and offered to patient who agrees to start the journey:  A. Whole Foods, Plant-based plate comprising of fruits and vegetables, plant-based proteins, whole-grain carbohydrates was discussed in detail with the patient.   A list for source of those nutrients were also provided to the patient.  Patient will use only water or unsweetened tea for hydration. B.  The need to stay away from risky substances including alcohol, smoking; obtaining 7 to 9 hours of restorative sleep, at least 150 minutes of moderate intensity exercise weekly, the importance of healthy social connections,  and stress reduction techniques were discussed.       Relevant Medications   losartan-hydrochlorothiazide (HYZAAR) 100-25 MG tablet   amLODipine (NORVASC) 10 MG tablet   Arthralgia    Pain is worse in the left knee had a Baker's cyst evaluated recently I gave patient knee exercises and refills on Naprosyn      Prediabetes - Primary    The following Lifestyle Medicine recommendations according to [place] of Lifestyle Medicine [facility]) were discussed and offered to patient who agrees to start the journey:  A. Whole Foods, Plant-based plate comprising of fruits and vegetables, plant-based proteins, whole-grain carbohydrates was discussed in detail with the patient.   A list for source of those nutrients were also provided to the patient.  Patient will use only water or unsweetened tea for hydration. B.  The need to stay away from risky substances including alcohol, smoking; obtaining 7 to 9 hours of restorative sleep, at least 150 minutes of moderate intensity exercise weekly, the importance of healthy social connections,  and stress reduction techniques were  discussed.   Reassess A1c      Relevant Orders   Hemoglobin A1c   Lipid panel   Obesity (BMI 30.0-34.9)    As per hypertension prediabetes assessments      Other Visit Diagnoses     Moderate persistent asthma without complication       Relevant Medications   montelukast (SINGULAIR) 10 MG tablet   albuterol (VENTOLIN HFA) 108 (90 Base) MCG/ACT inhaler      38 minutes spent assessing multiple systems giving patient education multiple prescriptions written Labs assessed  Patient return for complete physical exam in 1 month and blood pressure follow-up Return in about 1 month (around 05/15/2022) for BP follow up and CPE.    Asencion Noble, MD

## 2022-04-15 ENCOUNTER — Telehealth: Payer: Self-pay

## 2022-04-15 LAB — CBC WITH DIFFERENTIAL/PLATELET
Basophils Absolute: 0.1 10*3/uL (ref 0.0–0.2)
Basos: 1 %
EOS (ABSOLUTE): 0.4 10*3/uL (ref 0.0–0.4)
Eos: 7 %
Hematocrit: 38.8 % (ref 37.5–51.0)
Hemoglobin: 12.6 g/dL — ABNORMAL LOW (ref 13.0–17.7)
Immature Grans (Abs): 0 10*3/uL (ref 0.0–0.1)
Immature Granulocytes: 0 %
Lymphocytes Absolute: 1.5 10*3/uL (ref 0.7–3.1)
Lymphs: 25 %
MCH: 28.2 pg (ref 26.6–33.0)
MCHC: 32.5 g/dL (ref 31.5–35.7)
MCV: 87 fL (ref 79–97)
Monocytes Absolute: 0.8 10*3/uL (ref 0.1–0.9)
Monocytes: 13 %
Neutrophils Absolute: 3.3 10*3/uL (ref 1.4–7.0)
Neutrophils: 54 %
Platelets: 263 10*3/uL (ref 150–450)
RBC: 4.47 x10E6/uL (ref 4.14–5.80)
RDW: 12.5 % (ref 11.6–15.4)
WBC: 6.1 10*3/uL (ref 3.4–10.8)

## 2022-04-15 LAB — COMPREHENSIVE METABOLIC PANEL
ALT: 9 IU/L (ref 0–44)
AST: 14 IU/L (ref 0–40)
Albumin/Globulin Ratio: 1.4 (ref 1.2–2.2)
Albumin: 4.2 g/dL (ref 3.9–4.9)
Alkaline Phosphatase: 99 IU/L (ref 44–121)
BUN/Creatinine Ratio: 16 (ref 10–24)
BUN: 15 mg/dL (ref 8–27)
Bilirubin Total: 0.5 mg/dL (ref 0.0–1.2)
CO2: 24 mmol/L (ref 20–29)
Calcium: 9.4 mg/dL (ref 8.6–10.2)
Chloride: 105 mmol/L (ref 96–106)
Creatinine, Ser: 0.95 mg/dL (ref 0.76–1.27)
Globulin, Total: 3.1 g/dL (ref 1.5–4.5)
Glucose: 87 mg/dL (ref 70–99)
Potassium: 4.2 mmol/L (ref 3.5–5.2)
Sodium: 143 mmol/L (ref 134–144)
Total Protein: 7.3 g/dL (ref 6.0–8.5)
eGFR: 90 mL/min/{1.73_m2} (ref >59)

## 2022-04-15 LAB — LIPID PANEL
Chol/HDL Ratio: 2.4 ratio (ref 0.0–5.0)
Cholesterol, Total: 187 mg/dL (ref 100–199)
HDL: 79 mg/dL (ref >39)
LDL Chol Calc (NIH): 88 mg/dL (ref 0–99)
Triglycerides: 117 mg/dL (ref 0–149)
VLDL Cholesterol Cal: 20 mg/dL (ref 5–40)

## 2022-04-15 LAB — HEMOGLOBIN A1C
Est. average glucose Bld gHb Est-mCnc: 120 mg/dL
Hgb A1c MFr Bld: 5.8 % — ABNORMAL HIGH (ref 4.8–5.6)

## 2022-04-15 NOTE — Progress Notes (Signed)
Let pt know A1C is good he has prediabetes focus on healthy foods, no need for medication,   Blood counts, kidney , liver normal , cholesterol is normal

## 2022-04-15 NOTE — Telephone Encounter (Signed)
Pt was called and is aware of results, DOB was confirmed.  ?

## 2022-04-15 NOTE — Telephone Encounter (Signed)
-----   Message from Elsie Stain, MD sent at 04/15/2022  6:14 AM EDT ----- Let pt know A1C is good he has prediabetes focus on healthy foods, no need for medication,   Blood counts, kidney , liver normal , cholesterol is normal

## 2022-06-01 NOTE — Progress Notes (Signed)
Established Patient Office Visit  Subjective   Patient ID: Edward Wilson, male    DOB: [birth year]  Age: 61 y.o. MRN: [id number]  No chief complaint on file.   Edward Wilson is a 61 year old male with history of  hypertension, asthma and psoriasis who returns today for a follow up.  7/11  He reports he has been compliant with his blood pressure medications amlodipine and losartan. Not currently checking his pressures at home but is in the process of obtaining a machine. His asthma has been well controlled and reports he rarely will use his albuterol inhaler. His symptoms usually occur seasonally with allergies. He was previously given Singulair with relief and is requesting a refill. Patient also reports some skin dryness  and states that clobetasol cream we previously had prescribed seemed to help. Also reports some intermittent right sided lower back pain ongoing 1 month, that does not radiate and can not be reproduced. Naproxen brings relief. Recently was [facility]for a right Baker's cyst, treated with 4 consecutive weekly steroid injections, completed 2 weeks ago. Per patient, he has had relief in symptoms since.  He is still smoking cigarettes, reports 3-4 weekly with consumption of a beer.  He expresses some concern about diabetes and is interested in a full work up for it in the future. Patient denies increased thirst, increased urination, fatigue or acute visual changes. States his vision has been progressively declining for years and believes it is age related, plans to obtain glasses in the future.  BP today is 146/76. He did take his medications this morning.    8/29    Cardiovascular and Mediastinum  Essential hypertension   Blood pressure not at goal plan for this patient will be to change losartan to losartan HCT 100/25 daily we will continue amlodipine 10 mg daily and was given a lifestyle medicine approach  The following Lifestyle Medicine recommendations according to  [place] of Lifestyle Medicine [facility] discussed and offered to patient who agrees to start the journey:  A. Whole Foods, Plant-based plate comprising of fruits and vegetables, plant-based proteins, whole-grain carbohydrates was discussed in detail with the patient. A list for source of those nutrients were also provided to the patient. Patient will use only water or unsweetened tea for hydration. B. The need to stay away from risky substances including alcohol, smoking; obtaining 7 to 9 hours of restorative sleep, at least 150 minutes of moderate intensity exercise weekly, the importance of healthy social connections, and stress reduction techniques were discussed.  Patient returns short-term in 6 weeks     Relevant Medications  losartan-hydrochlorothiazide (HYZAAR) 100-25 MG tablet  amLODipine (NORVASC) 10 MG tablet  Other Relevant Orders  Comprehensive metabolic panel  CBC with Differential/Platelet   Respiratory  Asthma, chronic (Chronic)   Chronic asthma we will refill albuterol and Singulair    Relevant Medications  montelukast (SINGULAIR) 10 MG tablet  albuterol (VENTOLIN HFA) 108 (90 Base) MCG/ACT inhaler   Musculoskeletal and Integument  Psoriasis-like skin disease   Patient prefers Temovate this works better we will represcribe this again and mix this with skin moisturization twice daily to the arms and hands     Other  Tobacco use disorder (Chronic)       Current smoking consumption amount: 4 cigarettes a week   Dicsussion on advise to quit smoking and smoking impacts: Cardiovascular impacts lung impacts   Patient's willingness to quit: Wants to quit   Methods to quit smoking discussed: Every modification  nicotine replacement   Medication management of smoking session drugs discussed: Nicotine replacement   Resources provided:  AVS    Setting quit date not established   Follow-up arranged 4 weeks   Time spent counseling the  patient: 5 minutes       Patient Active Problem List   Diagnosis Date Noted  . Prediabetes 05/26/2021  . Obesity (BMI 30.0-34.9) 05/26/2021  . 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 Pure hypercholesterolemia 09/18/2016  . Asthma, chronic 08/07/2016  . History of syphilis 10/14/2015  . Essential hypertension 02/12/2015  . Arthralgia 02/12/2015  . Tobacco use disorder 02/12/2015   Past Medical History:  Diagnosis Date  . Alcohol use 02/12/2015   Currently drinking 3 or more 40 ounce beers weekly.  . 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 Anemia   . Asthma   . GERD (gastroesophageal reflux disease)   . Hyperlipidemia   . Hypertension    Past Surgical History:  Procedure Laterality Date  . COLONOSCOPY  2016  . POLYPECTOMY  2016   Social History   Tobacco Use  . Smoking status: Some Days    Packs/day: 0.10    Types: Cigarettes    Last attempt to quit: 01/25/2017    Years since quitting: 5.3  . Smokeless tobacco: Never  . Tobacco comments:    0.5 pack a week 02/06/20/ER  Vaping Use  . Vaping Use: Never used  Substance Use Topics  . Alcohol use: Yes    Comment: 12 pack of beer per week  . Drug use: No   Social History   Socioeconomic History  . Marital status: Single    Spouse name: Not on file  . Number of children: Not on file  . Years of education: Not on file  . Highest education level: Not on file  Occupational History  . Not on file  Tobacco Use  . Smoking status: Some Days    Packs/day: 0.10    Types: Cigarettes    Last attempt to quit: 01/25/2017    Years since quitting: 5.3  . Smokeless tobacco: Never  . Tobacco comments:    0.5 pack a week 02/06/20/ER  Vaping Use  . Vaping Use: Never used  Substance and Sexual Activity  . Alcohol use: Yes    Comment: 12 pack of beer per week  . Drug use: No  . Sexual activity: Not on file  Other Topics Concern  . Not on file  Social History Narrative  . Not  on file   Social Determinants of Health   Financial Resource Strain: Not on file  Food Insecurity: Not on file  Transportation Needs: Not on file  Physical Activity: Not on file  Stress: Not on file  Social Connections: Not on file  Intimate Partner Violence: Not on file   Family Status  Relation Name Status  . Mother  Deceased  . Father  Deceased  . Mat [employer]  . Neg Hx  (Not Specified)   Family History  Problem Relation Age of Onset  . Heart failure Mother        heart attack  . Heart attack Mother   . Hypertension Maternal Aunt   . Heart Problems Maternal Aunt   . Colon cancer Neg Hx   . Stomach cancer Neg Hx   . Esophageal cancer Neg Hx   . Rectal cancer Neg Hx    Allergies  Allergen Reactions  . Shellfish Allergy Anaphylaxis and Hives  Reports causes hives and feeling of throat closing, difficulty breathing      Review of Systems  Constitutional: Negative.   HENT: Negative.    Eyes: Negative.   Respiratory: Negative.    Cardiovascular: Negative.   Gastrointestinal: Negative.   Genitourinary: Negative.   Musculoskeletal:  Positive for back pain (right lower).  Skin:        Skin dryness, bilateral forearms  Neurological: Negative.   Endo/Heme/Allergies: Negative.   Psychiatric/Behavioral: Negative.        Objective:     There were no vitals taken for this visit. BP Readings from Last 3 Encounters:  04/14/22 (!) 146/76  02/01/22 (!) 166/81  09/24/21 (!) 164/73   Wt Readings from Last 3 Encounters:  04/14/22 276 lb 6.4 oz (125.4 kg)  02/01/22 282 lb 3 oz (128 kg)  09/24/21 282 lb 3.2 oz (128 kg)      Physical Exam Constitutional:      Appearance: Normal appearance.  HENT:     Head: Normocephalic.     Mouth/Throat:     Mouth: Mucous membranes are moist.     Pharynx: Oropharynx is clear.  Eyes:     General: Lids are normal.     Conjunctiva/sclera: Conjunctivae normal.  Cardiovascular:     Rate and Rhythm: Normal rate and regular  rhythm.     Heart sounds: Normal heart sounds.  Pulmonary:     Effort: Pulmonary effort is normal.     Breath sounds: Normal breath sounds.  Abdominal:     General: Bowel sounds are normal.     Palpations: Abdomen is soft.  Musculoskeletal:     Cervical back: Full passive range of motion without pain.  Skin:    Findings: No rash.     Comments: Dry raised patches bilateral forearms   Neurological:     Mental Status: He is alert and oriented to person, place, and time.  Psychiatric:        Mood and Affect: Mood normal.        Behavior: Behavior normal.     No results found for any visits on 06/02/22.  Last CBC Lab Results  Component Value Date   WBC 6.1 04/14/2022   HGB 12.6 (L) 04/14/2022   HCT 38.8 04/14/2022   MCV 87 04/14/2022   MCH 28.2 04/14/2022   RDW 12.5 04/14/2022   PLT 263 01/60/1093   Last metabolic panel Lab Results  Component Value Date   GLUCOSE 87 04/14/2022   NA 143 04/14/2022   K 4.2 04/14/2022   CL 105 04/14/2022   CO2 24 04/14/2022   BUN 15 04/14/2022   CREATININE 0.95 04/14/2022   EGFR 90 04/14/2022   CALCIUM 9.4 04/14/2022   PROT 7.3 04/14/2022   ALBUMIN 4.2 04/14/2022   LABGLOB 3.1 04/14/2022   AGRATIO 1.4 04/14/2022   BILITOT 0.5 04/14/2022   ALKPHOS 99 04/14/2022   AST 14 04/14/2022   ALT 9 04/14/2022   ANIONGAP 7 12/25/2019   Last lipids Lab Results  Component Value Date   CHOL 187 04/14/2022   HDL 79 04/14/2022   LDLCALC 88 04/14/2022   TRIG 117 04/14/2022   CHOLHDL 2.4 04/14/2022   Last hemoglobin A1c Lab Results  Component Value Date   HGBA1C 5.8 (H) 04/14/2022   Last thyroid functions No results found for: "TSH", "T3TOTAL", "T4TOTAL", "THYROIDAB" Last vitamin D No results found for: "25OHVITD2", "25OHVITD3", "VD25OH" Last vitamin B12 and Folate No results found for: "VITAMINB12", "FOLATE"  The 10-year ASCVD risk score (Arnett DK, et al., 2019) is: 26.5%    Assessment & Plan:   Problem List Items Addressed  This Visit   None 38 minutes spent assessing multiple systems giving patient education multiple prescriptions written Labs assessed  Patient return for complete physical exam in 1 month and blood pressure follow-up No follow-ups on file.    Asencion Noble, MD

## 2022-06-02 ENCOUNTER — Encounter: Payer: Self-pay | Admitting: Critical Care Medicine

## 2022-06-02 ENCOUNTER — Ambulatory Visit
Payer: No Typology Code available for payment source | Attending: Critical Care Medicine | Admitting: Critical Care Medicine

## 2022-06-02 ENCOUNTER — Telehealth: Payer: Self-pay | Admitting: Critical Care Medicine

## 2022-06-02 VITALS — BP 123/78 | HR 66 | Ht 77.0 in | Wt 266.0 lb

## 2022-06-02 DIAGNOSIS — L989 Disorder of the skin and subcutaneous tissue, unspecified: Secondary | ICD-10-CM

## 2022-06-02 DIAGNOSIS — I10 Essential (primary) hypertension: Secondary | ICD-10-CM | POA: Diagnosis not present

## 2022-06-02 DIAGNOSIS — R7303 Prediabetes: Secondary | ICD-10-CM

## 2022-06-02 DIAGNOSIS — E7800 Pure hypercholesterolemia, unspecified: Secondary | ICD-10-CM

## 2022-06-02 DIAGNOSIS — F17210 Nicotine dependence, cigarettes, uncomplicated: Secondary | ICD-10-CM

## 2022-06-02 DIAGNOSIS — Z23 Encounter for immunization: Secondary | ICD-10-CM

## 2022-06-02 DIAGNOSIS — F17200 Nicotine dependence, unspecified, uncomplicated: Secondary | ICD-10-CM

## 2022-06-02 DIAGNOSIS — E669 Obesity, unspecified: Secondary | ICD-10-CM

## 2022-06-02 MED ORDER — AMLODIPINE BESYLATE 10 MG PO TABS: 10.0000 mg | ORAL_TABLET | Freq: Every day | ORAL | 2 refills | Status: DC

## 2022-06-02 MED ORDER — LOSARTAN POTASSIUM-HCTZ 100-25 MG PO TABS: 1.0000 | ORAL_TABLET | Freq: Every day | ORAL | 2 refills | Status: DC

## 2022-06-02 NOTE — Patient Instructions (Addendum)
No change in medications, refills on the amlodipine and losartan HCT were sent to your [place] on reducing your last few cigarettes daily  Flu vaccine was given  Referral to Rosana Hoes our licensed social worker was made for counseling  Return to see Dr. Joya Gaskins 4 months  Try to fit in 2-year week 20 minutes of walking 4 times a week

## 2022-06-02 NOTE — Assessment & Plan Note (Signed)
As per hypertension prediabetes assessments

## 2022-06-02 NOTE — Telephone Encounter (Signed)
Please see this patient for anxiety he is stressing over bills and also help him with smoking cessation counseling

## 2022-06-02 NOTE — Assessment & Plan Note (Signed)
  .   Current smoking consumption amount: 2 cigarettes a week  . Dicsussion on advise to quit smoking and smoking impacts: Cardiovascular impacts lung impacts  . Patient's willingness to quit: Wants to quit  . Methods to quit smoking discussed: Every modification nicotine replacement  . Medication management of smoking session drugs discussed: Nicotine replacement  . Resources provided:  AVS   . Setting quit date not established  . Follow-up arranged 4 weeks   Time spent counseling the patient: 5 minutes

## 2022-06-02 NOTE — Assessment & Plan Note (Signed)
Markedly improved on current regimen no medicine changes made we will stay with amlodipine 10 mg daily and losartan HCT 100/25 daily  The following Lifestyle Medicine recommendations according to [employer] [facility]) were discussed and offered to patient who agrees to start the journey:  A. Whole Foods, Plant-based plate comprising of fruits and vegetables, plant-based proteins, whole-grain carbohydrates was discussed in detail with the patient.   A list for source of those nutrients were also provided to the patient.  Patient will use only water or unsweetened tea for hydration. B.  The need to stay away from risky substances including alcohol, smoking; obtaining 7 to 9 hours of restorative sleep, at least 150 minutes of moderate intensity exercise weekly, the importance of healthy social connections,  and stress reduction techniques were discussed. C.  A full color page of  Calorie density of various food groups per pound showing examples of each food groups was provided to the patient.

## 2022-06-02 NOTE — Assessment & Plan Note (Signed)
Patient treating his increased cholesterol with diet alone

## 2022-06-02 NOTE — Assessment & Plan Note (Signed)
The following Lifestyle Medicine recommendations according to [employer] ([employer]) were discussed and offered to patient who agrees to start the journey:  A. Whole Foods, Plant-based plate comprising of fruits and vegetables, plant-based proteins, whole-grain carbohydrates was discussed in detail with the patient.   A list for source of those nutrients were also provided to the patient.  Patient will use only water or unsweetened tea for hydration. B.  The need to stay away from risky substances including alcohol, smoking; obtaining 7 to 9 hours of restorative sleep, at least 150 minutes of moderate intensity exercise weekly, the importance of healthy social connections,  and stress reduction techniques were discussed. 

## 2022-06-02 NOTE — Assessment & Plan Note (Signed)
Continue with topical Temovate

## 2022-06-04 NOTE — Telephone Encounter (Signed)
LCSWA called patient today to introduce herself and to assess patients' mental health needs. Patient was referred by PCP for  anxiety, he is stressing over bills and needs help with smoking cessation counseling. Patient stated that he has cut back on smoking and has that under control. Patient stated he does have views on life that he should talk to someone abut but he is not interested in talking with anyone abut these things at this current time. LCSWA suggested that he please reach out to [facility] when he is ready to schedule an appointment to address his concerns.

## 2022-10-03 NOTE — Progress Notes (Unsigned)
Established Patient Office Visit  Subjective   Patient ID: Edward Wilson, male    DOB: [birth year]  Age: 61 y.o. MRN: [id number]  No chief complaint on file.   Edward Wilson is a 61 year old male with history of  hypertension, asthma and psoriasis who returns today for a follow up.  7/11  He reports he has been compliant with his blood pressure medications amlodipine and losartan. Not currently checking his pressures at home but is in the process of obtaining a machine. His asthma has been well controlled and reports he rarely will use his albuterol inhaler. His symptoms usually occur seasonally with allergies. He was previously given Singulair with relief and is requesting a refill. Patient also reports some skin dryness  and states that clobetasol cream we previously had prescribed seemed to help. Also reports some intermittent right sided lower back pain ongoing 1 month, that does not radiate and can not be reproduced. Naproxen brings relief. Recently was seen at [facility]for a right Baker's cyst, treated with 4 consecutive weekly steroid injections, completed 2 weeks ago. Per patient, he has had relief in symptoms since.  He is still smoking cigarettes, reports 3-4 weekly with consumption of a beer.  He expresses some concern about diabetes and is interested in a full work up for it in the future. Patient denies increased thirst, increased urination, fatigue or acute visual changes. States his vision has been progressively declining for years and believes it is age related, plans to obtain glasses in the future.  BP today is 146/76. He did take his medications this morning.    8/29 Patient seen and return follow-up from July appointment improved with blood pressure.  Today blood pressure is 123/78 and is on the higher dose losartan HCT.  He is trying to eat a healthier diet as well.  He is yet to be able to achieve a good walking program.  Patient has no other complaints.  He is dropped his cigarette  consumption down to 1 cigarette daily.  10/07/22        Patient Active Problem List   Diagnosis Date Noted   Prediabetes 05/26/2021   Obesity (BMI 30.0-34.9) 05/26/2021   Psoriasis-like skin disease 06/27/2020   Pure hypercholesterolemia 09/18/2016   Asthma, chronic 08/07/2016   History of syphilis 10/14/2015   Essential hypertension 02/12/2015   Arthralgia 02/12/2015   Tobacco use disorder 02/12/2015   Past Medical History:  Diagnosis Date   Alcohol use 02/12/2015   Currently drinking 3 or more 40 ounce beers weekly.   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[place]) 02/06/2020   Anemia    Asthma    GERD (gastroesophageal reflux disease)    Hyperlipidemia    Hypertension    Past Surgical History:  Procedure Laterality Date   COLONOSCOPY  2016   POLYPECTOMY  2016   Social History   Tobacco Use   Smoking status: Some Days    Packs/day: 0.10    Types: Cigarettes    Last attempt to quit: 01/25/2017    Years since quitting: 5.6   Smokeless tobacco: Never   Tobacco comments:    0.5 pack a week 02/06/20/ER  Vaping Use   Vaping Use: Never used  Substance Use Topics   Alcohol use: Yes    Comment: 12 pack of beer per week   Drug use: No   Social History   Socioeconomic History   Marital status: Single    Spouse name: Not on file  Number of children: Not on file   Years of education: Not on file   Highest education level: Not on file  Occupational History   Not on file  Tobacco Use   Smoking status: Some Days    Packs/day: 0.10    Types: Cigarettes    Last attempt to quit: 01/25/2017    Years since quitting: 5.6   Smokeless tobacco: Never   Tobacco comments:    0.5 pack a week 02/06/20/ER  Vaping Use   Vaping Use: Never used  Substance and Sexual Activity   Alcohol use: Yes    Comment: 12 pack of beer per week   Drug use: No   Sexual activity: Not on file  Other Topics Concern   Not on file  Social History Narrative   Not on  file   Social Determinants of Health   Financial Resource Strain: Not on file  Food Insecurity: Not on file  Transportation Needs: Not on file  Physical Activity: Not on file  Stress: Not on file  Social Connections: Not on file  Intimate Partner Violence: Not on file   Family Status  Relation Name Status   Mother  Deceased   Father  Deceased   Mat Aunt  Alive   Neg Hx  (Not Specified)   Family History  Problem Relation Age of Onset   Heart failure Mother        heart attack   Heart attack Mother    Hypertension Maternal Aunt    Heart Problems Maternal Aunt    Colon cancer Neg Hx    Stomach cancer Neg Hx    Esophageal cancer Neg Hx    Rectal cancer Neg Hx    Allergies  Allergen Reactions   Shellfish Allergy Anaphylaxis and Hives    Reports causes hives and feeling of throat closing, difficulty breathing      Review of Systems  Constitutional: Negative.  Negative for chills, diaphoresis, fever, malaise/fatigue and weight loss.  HENT: Negative.  Negative for congestion, ear discharge, ear pain, hearing loss, nosebleeds, sore throat and tinnitus.   Eyes: Negative.  Negative for blurred vision, double vision, photophobia and discharge.  Respiratory: Negative.  Negative for cough, hemoptysis, sputum production, shortness of breath, wheezing and stridor.        No excess mucus  Cardiovascular: Negative.  Negative for chest pain, palpitations, orthopnea, claudication, leg swelling and PND.  Gastrointestinal: Negative.  Negative for abdominal pain, blood in stool, constipation, diarrhea, heartburn, melena, nausea and vomiting.  Genitourinary: Negative.  Negative for dysuria, flank pain, frequency, hematuria and urgency.  Musculoskeletal:  Negative for back pain (right lower), falls, joint pain, myalgias and neck pain.  Skin:  Negative for itching and rash.       Skin dryness, bilateral forearms  Neurological: Negative.  Negative for dizziness, tingling, tremors, sensory  change, speech change, focal weakness, seizures, loss of consciousness, weakness and headaches.  Endo/Heme/Allergies: Negative.  Negative for environmental allergies and polydipsia. Does not bruise/bleed easily.  Psychiatric/Behavioral: Negative.  Negative for depression, hallucinations, memory loss, substance abuse and suicidal ideas. The patient is not nervous/anxious and does not have insomnia.   All other systems reviewed and are negative.     Objective:     There were no vitals taken for this visit. BP Readings from Last 3 Encounters:  06/02/22 123/78  04/14/22 (!) 146/76  02/01/22 (!) 166/81   Wt Readings from Last 3 Encounters:  06/02/22 266 lb (120.7 kg)  04/14/22 276  lb 6.4 oz (125.4 kg)  02/01/22 282 lb 3 oz (128 kg)      Physical Exam Vitals reviewed.  Constitutional:      Appearance: Normal appearance. He is well-developed. He is not diaphoretic.  HENT:     Head: Normocephalic and atraumatic.     Nose: No nasal deformity, septal deviation, mucosal edema or rhinorrhea.     Right Sinus: No maxillary sinus tenderness or frontal sinus tenderness.     Left Sinus: No maxillary sinus tenderness or frontal sinus tenderness.     Mouth/Throat:     Mouth: Mucous membranes are moist.     Pharynx: Oropharynx is clear. No oropharyngeal exudate.  Eyes:     General: Lids are normal. No scleral icterus.    Conjunctiva/sclera: Conjunctivae normal.     Pupils: Pupils are equal, round, and reactive to light.  Neck:     Thyroid: No thyromegaly.     Vascular: No carotid bruit or JVD.     Trachea: Trachea normal. No tracheal tenderness or tracheal deviation.  Cardiovascular:     Rate and Rhythm: Normal rate and regular rhythm.     Chest Wall: PMI is not displaced.     Pulses: Normal pulses. No decreased pulses.     Heart sounds: Normal heart sounds, S1 normal and S2 normal. Heart sounds not distant. No murmur heard.    No systolic murmur is present.     No diastolic murmur is  present.     No friction rub. No gallop. No S3 or S4 sounds.  Pulmonary:     Effort: Pulmonary effort is normal. No tachypnea, accessory muscle usage or respiratory distress.     Breath sounds: Normal breath sounds. No stridor. No decreased breath sounds, wheezing, rhonchi or rales.  Chest:     Chest wall: No tenderness.  Abdominal:     General: Bowel sounds are normal. There is no distension.     Palpations: Abdomen is soft. Abdomen is not rigid.     Tenderness: There is no abdominal tenderness. There is no guarding or rebound.  Musculoskeletal:        General: Normal range of motion.     Cervical back: Full passive range of motion without pain, normal range of motion and neck supple. No edema, erythema or rigidity. No muscular tenderness. Normal range of motion.  Lymphadenopathy:     Head:     Right side of head: No submental or submandibular adenopathy.     Left side of head: No submental or submandibular adenopathy.     Cervical: No cervical adenopathy.  Skin:    General: Skin is warm and dry.     Coloration: Skin is not pale.     Findings: No rash.     Nails: There is no clubbing.     Comments: Dry raised patches bilateral forearms   Neurological:     Mental Status: He is alert and oriented to person, place, and time.     Sensory: No sensory deficit.  Psychiatric:        Mood and Affect: Mood normal.        Speech: Speech normal.        Behavior: Behavior normal.      No results found for any visits on 10/06/22.  Last CBC Lab Results  Component Value Date   WBC 6.1 04/14/2022   HGB 12.6 (L) 04/14/2022   HCT 38.8 04/14/2022   MCV 87 04/14/2022   MCH 28.2 04/14/2022  RDW 12.5 04/14/2022   PLT 263 67/61/9509   Last metabolic panel Lab Results  Component Value Date   GLUCOSE 87 04/14/2022   NA 143 04/14/2022   K 4.2 04/14/2022   CL 105 04/14/2022   CO2 24 04/14/2022   BUN 15 04/14/2022   CREATININE 0.95 04/14/2022   EGFR 90 04/14/2022   CALCIUM 9.4  04/14/2022   PROT 7.3 04/14/2022   ALBUMIN 4.2 04/14/2022   LABGLOB 3.1 04/14/2022   AGRATIO 1.4 04/14/2022   BILITOT 0.5 04/14/2022   ALKPHOS 99 04/14/2022   AST 14 04/14/2022   ALT 9 04/14/2022   ANIONGAP 7 12/25/2019   Last lipids Lab Results  Component Value Date   CHOL 187 04/14/2022   HDL 79 04/14/2022   LDLCALC 88 04/14/2022   TRIG 117 04/14/2022   CHOLHDL 2.4 04/14/2022   Last hemoglobin A1c Lab Results  Component Value Date   HGBA1C 5.8 (H) 04/14/2022   Last thyroid functions No results found for: "TSH", "T3TOTAL", "T4TOTAL", "THYROIDAB" Last vitamin D No results found for: "25OHVITD2", "25OHVITD3", "VD25OH" Last vitamin B12 and Folate No results found for: "VITAMINB12", "FOLATE"    The 10-year ASCVD risk score (Arnett DK, et al., 2019) is: 19.9%    Assessment & Plan:   Problem List Items Addressed This Visit   None Flu vaccine given No follow-ups on file.    Asencion Noble, MD

## 2022-10-06 ENCOUNTER — Ambulatory Visit
Payer: No Typology Code available for payment source | Attending: Critical Care Medicine | Admitting: Critical Care Medicine

## 2022-10-06 ENCOUNTER — Encounter: Payer: Self-pay | Admitting: Critical Care Medicine

## 2022-10-06 VITALS — BP 131/79 | HR 56 | Ht 77.0 in | Wt 278.0 lb

## 2022-10-06 DIAGNOSIS — Z87891 Personal history of nicotine dependence: Secondary | ICD-10-CM

## 2022-10-06 DIAGNOSIS — J4540 Moderate persistent asthma, uncomplicated: Secondary | ICD-10-CM | POA: Diagnosis not present

## 2022-10-06 DIAGNOSIS — R7303 Prediabetes: Secondary | ICD-10-CM | POA: Diagnosis not present

## 2022-10-06 DIAGNOSIS — I10 Essential (primary) hypertension: Secondary | ICD-10-CM

## 2022-10-06 DIAGNOSIS — E669 Obesity, unspecified: Secondary | ICD-10-CM

## 2022-10-06 DIAGNOSIS — E7800 Pure hypercholesterolemia, unspecified: Secondary | ICD-10-CM | POA: Diagnosis not present

## 2022-10-06 DIAGNOSIS — L989 Disorder of the skin and subcutaneous tissue, unspecified: Secondary | ICD-10-CM

## 2022-10-06 DIAGNOSIS — F329 Major depressive disorder, single episode, unspecified: Secondary | ICD-10-CM

## 2022-10-06 MED ORDER — LOSARTAN POTASSIUM-HCTZ 100-25 MG PO TABS: 1.0000 | ORAL_TABLET | Freq: Every day | ORAL | 2 refills | Status: DC

## 2022-10-06 MED ORDER — AMLODIPINE BESYLATE 10 MG PO TABS: 10.0000 mg | ORAL_TABLET | Freq: Every day | ORAL | 2 refills | Status: DC

## 2022-10-06 MED ORDER — MONTELUKAST SODIUM 10 MG PO TABS: 10.0000 mg | ORAL_TABLET | Freq: Every day | ORAL | 3 refills | Status: DC

## 2022-10-06 MED ORDER — CLOBETASOL PROPIONATE 0.05 % EX CREA: 1.0000 | TOPICAL_CREAM | Freq: Two times a day (BID) | CUTANEOUS | 1 refills | Status: DC

## 2022-10-06 MED ORDER — ALBUTEROL SULFATE HFA 108 (90 BASE) MCG/ACT IN AERS: 2.0000 | INHALATION_SPRAY | Freq: Four times a day (QID) | RESPIRATORY_TRACT | 5 refills | Status: DC | PRN

## 2022-10-06 NOTE — Assessment & Plan Note (Signed)
Chronic asthma will continue albuterol as needed I reinstructed him as to proper use

## 2022-10-06 NOTE — Assessment & Plan Note (Signed)
Not actively suicidal no plans  Will counsel patient and hold on medications

## 2022-10-06 NOTE — Assessment & Plan Note (Signed)
Currently not smoking encouraged to maintain cessation

## 2022-10-06 NOTE — Assessment & Plan Note (Signed)
Continue with topical creams refills given

## 2022-10-06 NOTE — Patient Instructions (Signed)
No change in medications refills sent to your [facility]  Resume montelukast daily  Labs : kidney function, cholesterol, diabetes labs  Focus on relaxation techniques and other things we discussed  See Dr Joya Gaskins 3 months

## 2022-10-06 NOTE — Assessment & Plan Note (Signed)
Hypertension approximately at goal we will continue losartan HCT and amlodipine and check metabolic panel

## 2022-10-06 NOTE — Assessment & Plan Note (Signed)

## 2022-10-07 LAB — BMP8+EGFR
BUN/Creatinine Ratio: 16 (ref 10–24)
BUN: 14 mg/dL (ref 8–27)
CO2: 24 mmol/L (ref 20–29)
Calcium: 9.8 mg/dL (ref 8.6–10.2)
Chloride: 101 mmol/L (ref 96–106)
Creatinine, Ser: 0.85 mg/dL (ref 0.76–1.27)
Glucose: 93 mg/dL (ref 70–99)
Potassium: 4.2 mmol/L (ref 3.5–5.2)
Sodium: 140 mmol/L (ref 134–144)
eGFR: 98 mL/min/{1.73_m2} (ref >59)

## 2022-10-07 LAB — HEMOGLOBIN A1C
Est. average glucose Bld gHb Est-mCnc: 123 mg/dL
Hgb A1c MFr Bld: 5.9 % — ABNORMAL HIGH (ref 4.8–5.6)

## 2022-10-07 LAB — LIPID PANEL
Chol/HDL Ratio: 2.5 ratio (ref 0.0–5.0)
Cholesterol, Total: 195 mg/dL (ref 100–199)
HDL: 79 mg/dL (ref >39)
LDL Chol Calc (NIH): 95 mg/dL (ref 0–99)
Triglycerides: 122 mg/dL (ref 0–149)
VLDL Cholesterol Cal: 21 mg/dL (ref 5–40)

## 2022-10-07 NOTE — Progress Notes (Signed)
Let pt know A1C at goal, cholesterol at goal, kidney normal

## 2022-10-08 ENCOUNTER — Telehealth: Payer: Self-pay

## 2022-10-08 NOTE — Telephone Encounter (Signed)
-----   Message from Elsie Stain, MD sent at 10/07/2022  6:17 AM EST ----- Let pt know A1C at goal, cholesterol at goal, kidney normal

## 2022-10-08 NOTE — Telephone Encounter (Signed)
Pt was called and is aware of results, DOB was confirmed.  ?

## 2023-01-06 ENCOUNTER — Ambulatory Visit: Payer: No Typology Code available for payment source | Admitting: Critical Care Medicine

## 2023-01-06 NOTE — Progress Notes (Deleted)
Established Patient Office Visit  Subjective   Patient ID: Edward Wilson, male    DOB: Aug 17, 1961  Age: 61 y.o. MRN: [id number]  No chief complaint on file.   Edward Wilson is a 62 year old male with history of  hypertension, asthma and psoriasis who returns today for a follow up.  7/11  He reports he has been compliant with his blood pressure medications amlodipine and losartan. Not currently checking his pressures at home but is in the process of obtaining a machine. His asthma has been well controlled and reports he rarely will use his albuterol inhaler. His symptoms usually occur seasonally with allergies. He was previously given Singulair with relief and is requesting a refill. Patient also reports some skin dryness  and states that clobetasol cream we previously had prescribed seemed to help. Also reports some intermittent right sided lower back pain ongoing 1 month, that does not radiate and can not be reproduced. Naproxen brings relief. Recently was seen at [facility]for a right Baker's cyst, treated with 4 consecutive weekly steroid injections, completed 2 weeks ago. Per patient, he has had relief in symptoms since.  He is still smoking cigarettes, reports 3-4 weekly with consumption of a beer.  He expresses some concern about diabetes and is interested in a full work up for it in the future. Patient denies increased thirst, increased urination, fatigue or acute visual changes. States his vision has been progressively declining for years and believes it is age related, plans to obtain glasses in the future.  BP today is 146/76. He did take his medications this morning.    8/29 Patient seen and return follow-up from July appointment improved with blood pressure.  Today blood pressure is 123/78 and is on the higher dose losartan HCT.  He is trying to eat a healthier diet as well.  He is yet to be able to achieve a good walking program.  Patient has no other complaints.  He is dropped his cigarette  consumption down to 1 cigarette daily.  10/06/22 Patient seen in return follow-up from last visit in August.  Blood pressure on arrival mildly elevated 132/79.  Note on his PHQ-9 he has marked that on occasion he is thought to be better off dead than alive.  He does not have a specific plan to harm himself.  Upon further questioning he states he has been in a relationship with a woman for 19 years and is still with her.  They recently purchased a house he has a mortgage that is floating right.  He states this is difficult for him and he is under financial stress he did not pick the house he wanted he picked a home girlfriend wanted and it does not suit him he is a large man and it is a small house.  He has a lot of reactive depression.  He does not have a specific plan to harm himself. Patient tends to watch hard news quite a bit does not exercise.  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  He is due a hemoglobin A1c at this visit.  He also needs a metabolic panel and lipid panel rechecked.  Patient has stopped smoking.  Patient's asthma is a bit more problematic he has not been using his albuterol the patient got his flu vaccine in August of last year  01/06/23     Patient Active Problem List   Diagnosis Date Noted   Reactive depression 10/06/2022   Prediabetes 05/26/2021  Obesity (BMI 30.0-34.9) 05/26/2021   Psoriasis-like skin disease 06/27/2020   Pure hypercholesterolemia 09/18/2016   Asthma, chronic 08/07/2016   History of syphilis 10/14/2015   Essential hypertension 02/12/2015   Arthralgia 02/12/2015   Former smoker 02/12/2015   Past Medical History:  Diagnosis Date   Alcohol use 02/12/2015   Currently drinking 3 or more 40 ounce beers weekly.   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[place]) 02/06/2020   Anemia    Asthma    GERD (gastroesophageal reflux disease)    Hyperlipidemia    Hypertension    Past Surgical History:  Procedure  Laterality Date   COLONOSCOPY  2016   POLYPECTOMY  2016   Social History   Tobacco Use   Smoking status: Former    Packs/day: .1    Types: Cigarettes    Quit date: 09/23/2022    Years since quitting: 0.2   Smokeless tobacco: Never   Tobacco comments:    0.5 pack a week 02/06/20/ER  Vaping Use   Vaping Use: Never used  Substance Use Topics   Alcohol use: Yes    Comment: 12 pack of beer per week   Drug use: No   Social History   Socioeconomic History   Marital status: Single    Spouse name: Not on file   Number of children: Not on file   Years of education: Not on file   Highest education level: Not on file  Occupational History   Not on file  Tobacco Use   Smoking status: Former    Packs/day: .1    Types: Cigarettes    Quit date: 09/23/2022    Years since quitting: 0.2   Smokeless tobacco: Never   Tobacco comments:    0.5 pack a week 02/06/20/ER  Vaping Use   Vaping Use: Never used  Substance and Sexual Activity   Alcohol use: Yes    Comment: 12 pack of beer per week   Drug use: No   Sexual activity: Not on file  Other Topics Concern   Not on file  Social History Narrative   Not on file   Social Determinants of Health   Financial Resource Strain: Not on file  Food Insecurity: Not on file  Transportation Needs: Not on file  Physical Activity: Not on file  Stress: Not on file  Social Connections: Not on file  Intimate Partner Violence: Not on file   Family Status  Relation Name Status   Mother  Deceased   Father  Deceased   Mat Aunt  Alive   Neg Hx  (Not Specified)   Family History  Problem Relation Age of Onset   Heart failure Mother        heart attack   Heart attack Mother    Hypertension Maternal Aunt    Heart Problems Maternal Aunt    Colon cancer Neg Hx    Stomach cancer Neg Hx    Esophageal cancer Neg Hx    Rectal cancer Neg Hx    Allergies  Allergen Reactions   Shellfish Allergy Anaphylaxis and Hives    Reports causes hives and  feeling of throat closing, difficulty breathing      Review of Systems  Constitutional: Negative.  Negative for chills, diaphoresis, fever, malaise/fatigue and weight loss.  HENT: Negative.  Negative for congestion, ear discharge, ear pain, hearing loss, nosebleeds, sore throat and tinnitus.   Eyes: Negative.  Negative for blurred vision, double vision, photophobia and discharge.  Respiratory:  Positive  for shortness of breath and wheezing. Negative for cough, hemoptysis, sputum production and stridor.        No excess mucus Occ wheezing Dyspnea comes on occasionally   Cardiovascular: Negative.  Negative for chest pain, palpitations, orthopnea, claudication, leg swelling and PND.  Gastrointestinal: Negative.  Negative for abdominal pain, blood in stool, constipation, diarrhea, heartburn, melena, nausea and vomiting.  Genitourinary: Negative.  Negative for dysuria, flank pain, frequency, hematuria and urgency.  Musculoskeletal:  Negative for back pain (improved), falls, joint pain, myalgias and neck pain.  Skin:  Negative for itching and rash.       Skin dryness, bilateral forearms  Neurological: Negative.  Negative for dizziness, tingling, tremors, sensory change, speech change, focal weakness, seizures, loss of consciousness, weakness and headaches.  Endo/Heme/Allergies: Negative.  Negative for environmental allergies and polydipsia. Does not bruise/bleed easily.  Psychiatric/Behavioral: Negative.  Negative for depression, hallucinations, memory loss, substance abuse and suicidal ideas. The patient is not nervous/anxious and does not have insomnia.   All other systems reviewed and are negative.     Objective:     There were no vitals taken for this visit. BP Readings from Last 3 Encounters:  10/06/22 131/79  06/02/22 123/78  04/14/22 (!) 146/76   Wt Readings from Last 3 Encounters:  10/06/22 278 lb (126.1 kg)  06/02/22 266 lb (120.7 kg)  04/14/22 276 lb 6.4 oz (125.4 kg)       Physical Exam Vitals reviewed.  Constitutional:      Appearance: Normal appearance. He is well-developed. He is not diaphoretic.  HENT:     Head: Normocephalic and atraumatic.     Nose: No nasal deformity, septal deviation, mucosal edema or rhinorrhea.     Right Sinus: No maxillary sinus tenderness or frontal sinus tenderness.     Left Sinus: No maxillary sinus tenderness or frontal sinus tenderness.     Mouth/Throat:     Mouth: Mucous membranes are moist.     Pharynx: Oropharynx is clear. No oropharyngeal exudate.  Eyes:     General: Lids are normal. No scleral icterus.    Conjunctiva/sclera: Conjunctivae normal.     Pupils: Pupils are equal, round, and reactive to light.  Neck:     Thyroid: No thyromegaly.     Vascular: No carotid bruit or JVD.     Trachea: Trachea normal. No tracheal tenderness or tracheal deviation.  Cardiovascular:     Rate and Rhythm: Normal rate and regular rhythm.     Chest Wall: PMI is not displaced.     Pulses: Normal pulses. No decreased pulses.     Heart sounds: Normal heart sounds, S1 normal and S2 normal. Heart sounds not distant. No murmur heard.    No systolic murmur is present.     No diastolic murmur is present.     No friction rub. No gallop. No S3 or S4 sounds.  Pulmonary:     Effort: Pulmonary effort is normal. No tachypnea, accessory muscle usage or respiratory distress.     Breath sounds: Normal breath sounds. No stridor. No decreased breath sounds, wheezing, rhonchi or rales.  Chest:     Chest wall: No tenderness.  Abdominal:     General: Bowel sounds are normal. There is no distension.     Palpations: Abdomen is soft. Abdomen is not rigid.     Tenderness: There is no abdominal tenderness. There is no guarding or rebound.  Musculoskeletal:        General: Normal range of motion.  Cervical back: Full passive range of motion without pain, normal range of motion and neck supple. No edema, erythema or rigidity. No muscular tenderness.  Normal range of motion.  Lymphadenopathy:     Head:     Right side of head: No submental or submandibular adenopathy.     Left side of head: No submental or submandibular adenopathy.     Cervical: No cervical adenopathy.  Skin:    General: Skin is warm and dry.     Coloration: Skin is not pale.     Findings: No rash.     Nails: There is no clubbing.     Comments: Dry raised patches bilateral forearms   Neurological:     Mental Status: He is alert and oriented to person, place, and time.     Sensory: No sensory deficit.  Psychiatric:        Mood and Affect: Mood normal.        Speech: Speech normal.        Behavior: Behavior normal.      No results found for any visits on 01/06/23.  Last CBC Lab Results  Component Value Date   WBC 6.1 04/14/2022   HGB 12.6 (L) 04/14/2022   HCT 38.8 04/14/2022   MCV 87 04/14/2022   MCH 28.2 04/14/2022   RDW 12.5 04/14/2022   PLT 263 [id number]   Last metabolic panel Lab Results  Component Value Date   GLUCOSE 93 10/06/2022   NA 140 10/06/2022   K 4.2 10/06/2022   CL 101 10/06/2022   CO2 24 10/06/2022   BUN 14 10/06/2022   CREATININE 0.85 10/06/2022   EGFR 98 10/06/2022   CALCIUM 9.8 10/06/2022   PROT 7.3 04/14/2022   ALBUMIN 4.2 04/14/2022   LABGLOB 3.1 04/14/2022   AGRATIO 1.4 04/14/2022   BILITOT 0.5 04/14/2022   ALKPHOS 99 04/14/2022   AST 14 04/14/2022   ALT 9 04/14/2022   ANIONGAP 7 12/25/2019   Last lipids Lab Results  Component Value Date   CHOL 195 10/06/2022   HDL 79 10/06/2022   LDLCALC 95 10/06/2022   TRIG 122 10/06/2022   CHOLHDL 2.5 10/06/2022   Last hemoglobin A1c Lab Results  Component Value Date   HGBA1C 5.9 (H) 10/06/2022   Last thyroid functions No results found for: "TSH", "T3TOTAL", "T4TOTAL", "THYROIDAB" Last vitamin D No results found for: "25OHVITD2", "25OHVITD3", "VD25OH" Last vitamin B12 and Folate No results found for: "VITAMINB12", "FOLATE"    The 10-year ASCVD risk score (Arnett  DK, et al., 2019) is: 13.6%    Assessment & Plan:   Problem List Items Addressed This Visit   None No follow-ups on file.    Asencion Noble, MD

## 2023-05-27 ENCOUNTER — Ambulatory Visit
Payer: No Typology Code available for payment source | Attending: Critical Care Medicine | Admitting: Critical Care Medicine

## 2023-05-27 ENCOUNTER — Encounter: Payer: Self-pay | Admitting: Critical Care Medicine

## 2023-05-27 VITALS — BP 126/67 | HR 72 | Ht 77.0 in | Wt 277.6 lb

## 2023-05-27 DIAGNOSIS — R7303 Prediabetes: Secondary | ICD-10-CM | POA: Diagnosis not present

## 2023-05-27 DIAGNOSIS — I10 Essential (primary) hypertension: Secondary | ICD-10-CM

## 2023-05-27 DIAGNOSIS — B351 Tinea unguium: Secondary | ICD-10-CM

## 2023-05-27 DIAGNOSIS — J4540 Moderate persistent asthma, uncomplicated: Secondary | ICD-10-CM | POA: Diagnosis not present

## 2023-05-27 DIAGNOSIS — E7800 Pure hypercholesterolemia, unspecified: Secondary | ICD-10-CM

## 2023-05-27 DIAGNOSIS — L989 Disorder of the skin and subcutaneous tissue, unspecified: Secondary | ICD-10-CM | POA: Diagnosis not present

## 2023-05-27 LAB — POCT GLYCOSYLATED HEMOGLOBIN (HGB A1C): HbA1c, POC (prediabetic range): 5.6 % — AB (ref 5.7–6.4)

## 2023-05-27 LAB — GLUCOSE, POCT (MANUAL RESULT ENTRY): POC Glucose: 106 mg/dl — AB (ref 70–99)

## 2023-05-27 MED ORDER — MONTELUKAST SODIUM 10 MG PO TABS: 10.0000 mg | ORAL_TABLET | Freq: Every day | ORAL | 3 refills | Status: DC

## 2023-05-27 MED ORDER — LOSARTAN POTASSIUM-HCTZ 100-25 MG PO TABS: 1.0000 | ORAL_TABLET | Freq: Every day | ORAL | 4 refills | Status: AC

## 2023-05-27 MED ORDER — AMLODIPINE BESYLATE 10 MG PO TABS
10.0000 mg | ORAL_TABLET | Freq: Every day | ORAL | 4 refills | Status: AC
Start: 2023-05-27 — End: 2023-08-25

## 2023-05-27 MED ORDER — CLOBETASOL PROPIONATE 0.05 % EX CREA: 1.0000 | TOPICAL_CREAM | Freq: Two times a day (BID) | CUTANEOUS | 1 refills | Status: AC

## 2023-05-27 MED ORDER — ALBUTEROL SULFATE HFA 108 (90 BASE) MCG/ACT IN AERS: 2.0000 | INHALATION_SPRAY | Freq: Four times a day (QID) | RESPIRATORY_TRACT | 5 refills | Status: AC | PRN

## 2023-05-27 NOTE — Patient Instructions (Signed)
Referral made Labs today Meds refilled  Return 5 months

## 2023-05-27 NOTE — Assessment & Plan Note (Signed)
Hypertension controlled on 10 mg daily amlodipine losartan HCT daily we will make no changes check labs

## 2023-05-27 NOTE — Assessment & Plan Note (Signed)
Referral to podiatry made

## 2023-05-27 NOTE — Assessment & Plan Note (Signed)
Not diabetic continue with diet control no medications needed

## 2023-05-27 NOTE — Progress Notes (Signed)
Established Patient Office Visit  Subjective   Patient ID: Edward Wilson, male    DOB: 1961-08-20  Age: 62 y.o. MRN: [id number]  Chief Complaint  Patient presents with   Hypertension    Concern with skin and BP    Edward Wilson is a 62 year old male with history of  hypertension, asthma and psoriasis who returns today for a follow up.  7/11  He reports he has been compliant with his blood pressure medications amlodipine and losartan. Not currently checking his pressures at home but is in the process of obtaining a machine. His asthma has been well controlled and reports he rarely will use his albuterol inhaler. His symptoms usually occur seasonally with allergies. He was previously given Singulair with relief and is requesting a refill. Patient also reports some skin dryness  and states that clobetasol cream we previously had prescribed seemed to help. Also reports some intermittent right sided lower back pain ongoing 1 month, that does not radiate and can not be reproduced. Naproxen brings relief. Recently was seen at [facility]for a right Baker's cyst, treated with 4 consecutive weekly steroid injections, completed 2 weeks ago. Per patient, he has had relief in symptoms since.  He is still smoking cigarettes, reports 3-4 weekly with consumption of a beer.  He expresses some concern about diabetes and is interested in a full work up for it in the future. Patient denies increased thirst, increased urination, fatigue or acute visual changes. States his vision has been progressively declining for years and believes it is age related, plans to obtain glasses in the future.  BP today is 146/76. He did take his medications this morning.    8/29 Patient seen and return follow-up from July appointment improved with blood pressure.  Today blood pressure is 123/78 and is on the higher dose losartan HCT.  He is trying to eat a healthier diet as well.  He is yet to be able to achieve a good walking program.   Patient has no other complaints.  He is dropped his cigarette consumption down to 1 cigarette daily.  10/06/22 Patient seen in return follow-up from last visit in August.  Blood pressure on arrival mildly elevated 132/79.  Note on his PHQ-9 he has marked that on occasion he is thought to be better off dead than alive.  He does not have a specific plan to harm himself.  Upon further questioning he states he has been in a relationship with a woman for 19 years and is still with her.  They recently purchased a house he has a mortgage that is floating right.  He states this is difficult for him and he is under financial stress he did not pick the house he wanted he picked a home girlfriend wanted and it does not suit him he is a large man and it is a small house.  He has a lot of reactive depression.  He does not have a specific plan to harm himself. Patient tends to watch hard news quite a bit does not exercise.  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  He is due a hemoglobin A1c at this visit.  He also needs a metabolic panel and lipid panel rechecked.  Patient has stopped smoking.  Patient's asthma is a bit more problematic he has not been using his albuterol the patient got his flu vaccine in August of last year  05/27/2023  The patient is seen in return follow-up from January  of this year.  Overall he is doing well blood pressure on arrival 126/67.  A1c is at goal with prediabetes blood sugar 106.  He does complain of dry skin and lesions in the lower skin of the knee below the knee as well also complains of thickening toenails and dry skin on the feet.  There are no other complaints.    Patient Active Problem List   Diagnosis Date Noted   Onychomycosis 05/27/2023   Reactive depression 10/06/2022   Prediabetes 05/26/2021   Obesity (BMI 30.0-34.9) 05/26/2021   Psoriasis-like skin disease 06/27/2020   Pure hypercholesterolemia 09/18/2016   Asthma, chronic 08/07/2016   History of  syphilis 10/14/2015   Essential hypertension 02/12/2015   Arthralgia 02/12/2015   Former smoker 02/12/2015   Past Medical History:  Diagnosis Date   Alcohol use 02/12/2015   Currently drinking 3 or more 40 ounce beers weekly.   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   Anemia    Asthma    GERD (gastroesophageal reflux disease)    Hyperlipidemia    Hypertension    Past Surgical History:  Procedure Laterality Date   COLONOSCOPY  2016   POLYPECTOMY  2016   Social History   Tobacco Use   Smoking status: Former    Current packs/day: 0.00    Types: Cigarettes    Quit date: 09/23/2022    Years since quitting: 0.6   Smokeless tobacco: Never   Tobacco comments:    0.5 pack a week 02/06/20/ER  Vaping Use   Vaping status: Never Used  Substance Use Topics   Alcohol use: Yes    Comment: 12 pack of beer per week   Drug use: No   Social History   Socioeconomic History   Marital status: Single    Spouse name: Not on file   Number of children: Not on file   Years of education: Not on file   Highest education level: Not on file  Occupational History   Not on file  Tobacco Use   Smoking status: Former    Current packs/day: 0.00    Types: Cigarettes    Quit date: 09/23/2022    Years since quitting: 0.6   Smokeless tobacco: Never   Tobacco comments:    0.5 pack a week 02/06/20/ER  Vaping Use   Vaping status: Never Used  Substance and Sexual Activity   Alcohol use: Yes    Comment: 12 pack of beer per week   Drug use: No   Sexual activity: Not on file  Other Topics Concern   Not on file  Social History Narrative   Not on file   Social Determinants of Health   Financial Resource Strain: Not on file  Food Insecurity: Not on file  Transportation Needs: Not on file  Physical Activity: Not on file  Stress: Not on file  Social Connections: Not on file  Intimate Partner Violence: Not on file   Family Status  Relation Name Status    Mother  Deceased   Father  Deceased   Mat Aunt  Alive   Neg Hx  (Not Specified)  No partnership data on file   Family History  Problem Relation Age of Onset   Heart failure Mother        heart attack   Heart attack Mother    Hypertension Maternal Aunt    Heart Problems Maternal Aunt    Colon cancer Neg Hx    Stomach cancer Neg Hx  Esophageal cancer Neg Hx    Rectal cancer Neg Hx    Allergies  Allergen Reactions   Shellfish Allergy Anaphylaxis and Hives    Reports causes hives and feeling of throat closing, difficulty breathing      Review of Systems  Constitutional: Negative.  Negative for chills, diaphoresis, fever, malaise/fatigue and weight loss.  HENT: Negative.  Negative for congestion, ear discharge, ear pain, hearing loss, nosebleeds, sore throat and tinnitus.   Eyes: Negative.  Negative for blurred vision, double vision, photophobia and discharge.  Respiratory:  Negative for cough, hemoptysis, sputum production, shortness of breath, wheezing and stridor.   Cardiovascular: Negative.  Negative for chest pain, palpitations, orthopnea, claudication, leg swelling and PND.  Gastrointestinal: Negative.  Negative for abdominal pain, blood in stool, constipation, diarrhea, heartburn, melena, nausea and vomiting.  Genitourinary: Negative.  Negative for dysuria, flank pain, frequency, hematuria and urgency.  Musculoskeletal:  Negative for back pain (improved), falls, joint pain, myalgias and neck pain.  Skin:  Negative for itching and rash.       Skin dryness, bilateral forearms  Neurological: Negative.  Negative for dizziness, tingling, tremors, sensory change, speech change, focal weakness, seizures, loss of consciousness, weakness and headaches.  Endo/Heme/Allergies: Negative.  Negative for environmental allergies and polydipsia. Does not bruise/bleed easily.  Psychiatric/Behavioral: Negative.  Negative for depression, hallucinations, memory loss, substance abuse and  suicidal ideas. The patient is not nervous/anxious and does not have insomnia.   All other systems reviewed and are negative.     Objective:     BP 126/67 (BP Location: Left Arm, Patient Position: Sitting, Cuff Size: Large)   Pulse 72   Ht 6\' 5"  (1.956 m)   Wt 277 lb 9.6 oz (125.9 kg)   SpO2 98%   BMI 32.92 kg/m  BP Readings from Last 3 Encounters:  05/27/23 126/67  10/06/22 131/79  06/02/22 123/78   Wt Readings from Last 3 Encounters:  05/27/23 277 lb 9.6 oz (125.9 kg)  10/06/22 278 lb (126.1 kg)  06/02/22 266 lb (120.7 kg)      Physical Exam Vitals reviewed.  Constitutional:      Appearance: Normal appearance. He is well-developed. He is not diaphoretic.  HENT:     Head: Normocephalic and atraumatic.     Nose: No nasal deformity, septal deviation, mucosal edema or rhinorrhea.     Right Sinus: No maxillary sinus tenderness or frontal sinus tenderness.     Left Sinus: No maxillary sinus tenderness or frontal sinus tenderness.     Mouth/Throat:     Mouth: Mucous membranes are moist.     Pharynx: Oropharynx is clear. No oropharyngeal exudate.  Eyes:     General: Lids are normal. No scleral icterus.    Conjunctiva/sclera: Conjunctivae normal.     Pupils: Pupils are equal, round, and reactive to light.  Neck:     Thyroid: No thyromegaly.     Vascular: No carotid bruit or JVD.     Trachea: Trachea normal. No tracheal tenderness or tracheal deviation.  Cardiovascular:     Rate and Rhythm: Normal rate and regular rhythm.     Chest Wall: PMI is not displaced.     Pulses: Normal pulses. No decreased pulses.     Heart sounds: Normal heart sounds, S1 normal and S2 normal. Heart sounds not distant. No murmur heard.    No systolic murmur is present.     No diastolic murmur is present.     No friction rub. No gallop. No  S3 or S4 sounds.  Pulmonary:     Effort: Pulmonary effort is normal. No tachypnea, accessory muscle usage or respiratory distress.     Breath sounds: Normal  breath sounds. No stridor. No decreased breath sounds, wheezing, rhonchi or rales.  Chest:     Chest wall: No tenderness.  Abdominal:     General: Bowel sounds are normal. There is no distension.     Palpations: Abdomen is soft. Abdomen is not rigid.     Tenderness: There is no abdominal tenderness. There is no guarding or rebound.  Musculoskeletal:        General: Normal range of motion.     Cervical back: Full passive range of motion without pain, normal range of motion and neck supple. No edema, erythema or rigidity. No muscular tenderness. Normal range of motion.     Comments: Onychomycosis of both toes both feet  Lymphadenopathy:     Head:     Right side of head: No submental or submandibular adenopathy.     Left side of head: No submental or submandibular adenopathy.     Cervical: No cervical adenopathy.  Skin:    General: Skin is warm and dry.     Coloration: Skin is not pale.     Findings: No rash.     Nails: There is no clubbing.     Comments: Dry raised patches bilateral forearms   Neurological:     Mental Status: He is alert and oriented to person, place, and time.     Sensory: No sensory deficit.  Psychiatric:        Mood and Affect: Mood normal.        Speech: Speech normal.        Behavior: Behavior normal.      Results for orders placed or performed in visit on 05/27/23  POCT glucose (manual entry)  Result Value Ref Range   POC Glucose 106 (A) 70 - 99 mg/dl  POCT glycosylated hemoglobin (Hb A1C)  Result Value Ref Range   Hemoglobin A1C     HbA1c POC (<> result, manual entry)     HbA1c, POC (prediabetic range) 5.6 (A) 5.7 - 6.4 %   HbA1c, POC (controlled diabetic range)      Last CBC Lab Results  Component Value Date   WBC 6.1 04/14/2022   HGB 12.6 (L) 04/14/2022   HCT 38.8 04/14/2022   MCV 87 04/14/2022   MCH 28.2 04/14/2022   RDW 12.5 04/14/2022   PLT 263 04/14/2022   Last metabolic panel Lab Results  Component Value Date   GLUCOSE 93  10/06/2022   NA 140 10/06/2022   K 4.2 10/06/2022   CL 101 10/06/2022   CO2 24 10/06/2022   BUN 14 10/06/2022   CREATININE 0.85 10/06/2022   EGFR 98 10/06/2022   CALCIUM 9.8 10/06/2022   PROT 7.3 04/14/2022   ALBUMIN 4.2 04/14/2022   LABGLOB 3.1 04/14/2022   AGRATIO 1.4 04/14/2022   BILITOT 0.5 04/14/2022   ALKPHOS 99 04/14/2022   AST 14 04/14/2022   ALT 9 04/14/2022   ANIONGAP 7 12/25/2019   Last lipids Lab Results  Component Value Date   CHOL 195 10/06/2022   HDL 79 10/06/2022   LDLCALC 95 10/06/2022   TRIG 122 10/06/2022   CHOLHDL 2.5 10/06/2022   Last hemoglobin A1c Lab Results  Component Value Date   HGBA1C 5.6 (A) 05/27/2023   Last thyroid functions No results found for: "TSH", "T3TOTAL", "T4TOTAL", "THYROIDAB" Last vitamin  D No results found for: "25OHVITD2", "25OHVITD3", "VD25OH" Last vitamin B12 and Folate No results found for: "VITAMINB12", "FOLATE"    The 10-year ASCVD risk score (Arnett DK, et al., 2019) is: 13.2%    Assessment & Plan:   Problem List Items Addressed This Visit       Cardiovascular and Mediastinum   Essential hypertension - Primary    Hypertension controlled on 10 mg daily amlodipine losartan HCT daily we will make no changes check labs      Relevant Medications   amLODipine (NORVASC) 10 MG tablet   losartan-hydrochlorothiazide (HYZAAR) 100-25 MG tablet   Other Relevant Orders   Comprehensive metabolic panel     Respiratory   Asthma, chronic (Chronic)    Asthma stable at this time use as needed albuterol and Singulair continue daily      Relevant Medications   albuterol (VENTOLIN HFA) 108 (90 Base) MCG/ACT inhaler   montelukast (SINGULAIR) 10 MG tablet     Musculoskeletal and Integument   Psoriasis-like skin disease    Persistent psoriatic lesions lower legs will refer to dermatology      Relevant Orders   Ambulatory referral to Dermatology   Onychomycosis    Referral to podiatry made      Relevant Orders    Ambulatory referral to Podiatry     Other   Pure hypercholesterolemia (Chronic)   Relevant Medications   amLODipine (NORVASC) 10 MG tablet   losartan-hydrochlorothiazide (HYZAAR) 100-25 MG tablet   Other Relevant Orders   Lipid panel   Prediabetes    Not diabetic continue with diet control no medications needed      Relevant Orders   POCT glucose (manual entry) (Completed)   POCT glycosylated hemoglobin (Hb A1C) (Completed)   Comprehensive metabolic panel   Lipid panel   Other Visit Diagnoses     Moderate persistent asthma without complication       Relevant Medications   albuterol (VENTOLIN HFA) 108 (90 Base) MCG/ACT inhaler   montelukast (SINGULAIR) 10 MG tablet       Return in about 5 months (around 10/27/2023) for htn, prediabetes, primary care follow up.    Shan Levans, MD

## 2023-05-27 NOTE — Assessment & Plan Note (Signed)
Asthma stable at this time use as needed albuterol and Singulair continue daily

## 2023-05-27 NOTE — Assessment & Plan Note (Signed)
Persistent psoriatic lesions lower legs will refer to dermatology

## 2023-05-28 ENCOUNTER — Ambulatory Visit: Payer: Self-pay

## 2023-05-28 ENCOUNTER — Telehealth: Payer: Self-pay

## 2023-05-28 LAB — COMPREHENSIVE METABOLIC PANEL
ALT: 10 IU/L (ref 0–44)
AST: 13 IU/L (ref 0–40)
Albumin: 4.2 g/dL (ref 3.9–4.9)
Alkaline Phosphatase: 97 IU/L (ref 44–121)
BUN/Creatinine Ratio: 15 (ref 10–24)
BUN: 14 mg/dL (ref 8–27)
Bilirubin Total: 0.3 mg/dL (ref 0.0–1.2)
CO2: 26 mmol/L (ref 20–29)
Calcium: 9.5 mg/dL (ref 8.6–10.2)
Chloride: 103 mmol/L (ref 96–106)
Creatinine, Ser: 0.93 mg/dL (ref 0.76–1.27)
Globulin, Total: 3.2 g/dL (ref 1.5–4.5)
Glucose: 85 mg/dL (ref 70–99)
Potassium: 4.5 mmol/L (ref 3.5–5.2)
Sodium: 143 mmol/L (ref 134–144)
Total Protein: 7.4 g/dL (ref 6.0–8.5)
eGFR: 92 mL/min/{1.73_m2} (ref >59)

## 2023-05-28 LAB — LIPID PANEL
Chol/HDL Ratio: 2.8 ratio (ref 0.0–5.0)
Cholesterol, Total: 191 mg/dL (ref 100–199)
HDL: 68 mg/dL (ref >39)
LDL Chol Calc (NIH): 108 mg/dL — ABNORMAL HIGH (ref 0–99)
Triglycerides: 81 mg/dL (ref 0–149)
VLDL Cholesterol Cal: 15 mg/dL (ref 5–40)

## 2023-05-28 NOTE — Telephone Encounter (Signed)
Results given in result notes.   Summary: Labs   Pt is calling in for lab results.

## 2023-05-28 NOTE — Telephone Encounter (Signed)
Pt was called and vm was left, Information has been sent to nurse pool.   

## 2023-05-28 NOTE — Progress Notes (Signed)
Let pt know labs normal cholesterol slightly high focus on continuing lifestyle diet low fat plant based

## 2023-05-28 NOTE — Telephone Encounter (Signed)
-----   Message from Shan Levans sent at 05/28/2023  6:39 AM EDT ----- Let pt know labs normal cholesterol slightly high focus on continuing lifestyle diet low fat plant based

## 2023-06-18 ENCOUNTER — Ambulatory Visit: Payer: No Typology Code available for payment source | Admitting: Podiatry

## 2023-06-25 ENCOUNTER — Ambulatory Visit (INDEPENDENT_AMBULATORY_CARE_PROVIDER_SITE_OTHER): Payer: No Typology Code available for payment source | Admitting: Podiatry

## 2023-06-25 DIAGNOSIS — B999 Unspecified infectious disease: Secondary | ICD-10-CM | POA: Diagnosis not present

## 2023-06-25 NOTE — Progress Notes (Signed)
Subjective:  Patient ID: Edward Wilson, male    DOB: 05-30-1961,  MRN: [id number]  Chief Complaint  Patient presents with   Nail Problem    Nail fungus     62 y.o. male presents with the above complaint. Patient presents with left foot interdigital space superinfection with skin epidermolysis and itching.  He wanted get it evaluated he does work in a water like environment.  He has to wear boots.  He has not seen and was prior to see me he is try some topical salve which has not helped.  Pain scale is 5 out of 10.  Mostly itching.   Review of Systems: Negative except as noted in the HPI. Denies N/V/F/Ch.  Past Medical History:  Diagnosis Date   Alcohol use 02/12/2015   Currently drinking 3 or more 40 ounce beers weekly.   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   Anemia    Asthma    GERD (gastroesophageal reflux disease)    Hyperlipidemia    Hypertension     Current Outpatient Medications:    albuterol (VENTOLIN HFA) 108 (90 Base) MCG/ACT inhaler, Inhale 2 puffs into the lungs every 6 (six) hours as needed for wheezing or shortness of breath., Disp: 6.7 g, Rfl: 5   amLODipine (NORVASC) 10 MG tablet, Take 1 tablet (10 mg total) by mouth daily., Disp: 30 tablet, Rfl: 4   clobetasol cream (TEMOVATE) 0.05 %, Apply 1 Application topically 2 (two) times daily., Disp: 30 g, Rfl: 1   losartan-hydrochlorothiazide (HYZAAR) 100-25 MG tablet, Take 1 tablet by mouth daily., Disp: 30 tablet, Rfl: 4   montelukast (SINGULAIR) 10 MG tablet, Take 1 tablet (10 mg total) by mouth at bedtime., Disp: 30 tablet, Rfl: 3  Social History   Tobacco Use  Smoking Status Former   Current packs/day: 0.00   Types: Cigarettes   Quit date: 09/23/2022   Years since quitting: 0.7  Smokeless Tobacco Never  Tobacco Comments   0.5 pack a week 02/06/20/ER    Allergies  Allergen Reactions   Shellfish Allergy Anaphylaxis and Hives    Reports causes hives and feeling of  throat closing, difficulty breathing   Objective:  There were no vitals filed for this visit. There is no height or weight on file to calculate BMI. Constitutional Well developed. Well nourished.  Vascular Dorsalis pedis pulses palpable bilaterally. Posterior tibial pulses palpable bilaterally. Capillary refill normal to all digits.  No cyanosis or clubbing noted. Pedal hair growth normal.  Neurologic Normal speech. Oriented to person, place, and time. Epicritic sensation to light touch grossly present bilaterally.  Dermatologic Left interdigital space macerated skin tissue with subjective component of itching all findings consistent superinfection.  No deep wounds noted.  No erythema noted.  Skin epidermolysis noted  Orthopedic: Normal joint ROM without pain or crepitus bilaterally. No visible deformities. No bony tenderness.   Radiographs: None Assessment:   1. Superinfection    Plan:  Patient was evaluated and treated and all questions answered.  Left foot superinfection -All questions and concerns were discussed with the patient extensive detail given the amount of superinfection that is present patient will benefit from Lamisil for 30 days doxycycline for 14 days and will also benefit from doing iodine dressing to interdigital space for next 30 days.  I also discussed shoe gear modification he states understanding will work on that -Doxycycline and Lamisil were dispensed  No follow-ups on file.

## 2023-06-28 ENCOUNTER — Telehealth: Payer: Self-pay | Admitting: Podiatry

## 2023-06-28 MED ORDER — TERBINAFINE HCL 250 MG PO TABS: 250.0000 mg | ORAL_TABLET | Freq: Every day | ORAL | 0 refills | Status: AC

## 2023-06-28 MED ORDER — DOXYCYCLINE HYCLATE 100 MG PO TABS: 100.0000 mg | ORAL_TABLET | Freq: Two times a day (BID) | ORAL | 0 refills | Status: AC

## 2023-06-28 NOTE — Telephone Encounter (Signed)
Patient called stated his pharmacy never received his Rx doxycycline and Lamisil prescription

## 2023-07-01 ENCOUNTER — Telehealth: Payer: Self-pay | Admitting: Podiatry

## 2023-07-01 NOTE — Telephone Encounter (Signed)
Pt left message on 06/25/2023 and 06/26/2023 about his medications that were to have been called in on Friday after his appt.  I returned call today and confirmed with pt that he got his medications and he said he did and thank you for calling back.

## 2023-07-30 ENCOUNTER — Ambulatory Visit: Payer: No Typology Code available for payment source | Admitting: Podiatry

## 2023-12-04 ENCOUNTER — Other Ambulatory Visit: Payer: Self-pay | Admitting: Critical Care Medicine

## 2023-12-06 NOTE — Telephone Encounter (Signed)
 Requested medications are due for refill today.  yes  Requested medications are on the active medications list.  yes  Last refill. 05/27/2023 #30 3 rf  Future visit scheduled.   no  Notes to clinic.  Dr. Delford Field is PCP. No upcoming ov is scheduled.    Requested Prescriptions  Pending Prescriptions Disp Refills   montelukast (SINGULAIR) 10 MG tablet [Pharmacy Med Name: Montelukast Sodium 10 MG Oral Tablet] 30 tablet 0    Sig: TAKE 1 TABLET BY MOUTH AT BEDTIME     Pulmonology:  Leukotriene Inhibitors Passed - 12/06/2023  4:04 PM      Passed - Valid encounter within last 12 months    Recent Outpatient Visits           6 months ago Essential hypertension   [facility] Dept Of [place]. [facility] Storm Frisk, MD   1 year ago Essential hypertension   [facility] Of [place]. [facility] Storm Frisk, MD   1 year ago Essential hypertension   [facility] Of [place]. [facility] Storm Frisk, MD   1 year ago Essential hypertension   [facility] Dept Of [place]. [facility] Storm Frisk, MD   2 years ago Essential hypertension   [facility] [place] [facility] - [facility] Dept Of [place]. [facility] Storm Frisk, MD

## 2023-12-06 NOTE — Telephone Encounter (Signed)
 Called pt to schedule appt with a new provider. Left message on machine.

## 2024-01-08 ENCOUNTER — Other Ambulatory Visit: Payer: Self-pay | Admitting: Critical Care Medicine

## 2024-02-04 ENCOUNTER — Other Ambulatory Visit: Payer: Self-pay | Admitting: Family Medicine

## 2024-02-04 DIAGNOSIS — Z122 Encounter for screening for malignant neoplasm of respiratory organs: Secondary | ICD-10-CM

## 2024-02-04 DIAGNOSIS — F17200 Nicotine dependence, unspecified, uncomplicated: Secondary | ICD-10-CM

## 2024-02-04 DIAGNOSIS — I10 Essential (primary) hypertension: Secondary | ICD-10-CM

## 2024-03-06 ENCOUNTER — Other Ambulatory Visit: Payer: Self-pay | Admitting: Family Medicine

## 2024-03-06 DIAGNOSIS — Z87891 Personal history of nicotine dependence: Secondary | ICD-10-CM

## 2024-03-06 DIAGNOSIS — C3490 Malignant neoplasm of unspecified part of unspecified bronchus or lung: Secondary | ICD-10-CM

## 2024-04-19 ENCOUNTER — Inpatient Hospital Stay: Admission: RE | Admit: 2024-04-19 | Source: Ambulatory Visit
